# Patient Record
Sex: Female | Born: 1950 | Race: White | Hispanic: No | Marital: Married | Smoking: Never smoker
Health system: Southern US, Community
[De-identification: ages and names within clinical notes are randomized; demographics above are authoritative.]

## PROBLEM LIST (undated history)

## (undated) DIAGNOSIS — K219 Gastro-esophageal reflux disease without esophagitis: Secondary | ICD-10-CM

## (undated) DIAGNOSIS — E119 Type 2 diabetes mellitus without complications: Secondary | ICD-10-CM

## (undated) DIAGNOSIS — C801 Malignant (primary) neoplasm, unspecified: Secondary | ICD-10-CM

## (undated) DIAGNOSIS — J45909 Unspecified asthma, uncomplicated: Secondary | ICD-10-CM

## (undated) DIAGNOSIS — T7840XA Allergy, unspecified, initial encounter: Secondary | ICD-10-CM

## (undated) DIAGNOSIS — M549 Dorsalgia, unspecified: Secondary | ICD-10-CM

## (undated) DIAGNOSIS — F329 Major depressive disorder, single episode, unspecified: Secondary | ICD-10-CM

## (undated) DIAGNOSIS — F32A Depression, unspecified: Secondary | ICD-10-CM

## (undated) DIAGNOSIS — I10 Essential (primary) hypertension: Secondary | ICD-10-CM

## (undated) DIAGNOSIS — E785 Hyperlipidemia, unspecified: Secondary | ICD-10-CM

## (undated) DIAGNOSIS — F419 Anxiety disorder, unspecified: Secondary | ICD-10-CM

## (undated) HISTORY — DX: Allergy, unspecified, initial encounter: T78.40XA

## (undated) HISTORY — DX: Major depressive disorder, single episode, unspecified: F32.9

## (undated) HISTORY — DX: Depression, unspecified: F32.A

## (undated) HISTORY — PX: OTHER SURGICAL HISTORY: SHX169

## (undated) HISTORY — DX: Dorsalgia, unspecified: M54.9

## (undated) HISTORY — DX: Hyperlipidemia, unspecified: E78.5

## (undated) HISTORY — DX: Essential (primary) hypertension: I10

## (undated) HISTORY — DX: Type 2 diabetes mellitus without complications: E11.9

## (undated) HISTORY — PX: TONSILLECTOMY: SUR1361

## (undated) HISTORY — DX: Unspecified asthma, uncomplicated: J45.909

## (undated) HISTORY — DX: Malignant (primary) neoplasm, unspecified: C80.1

---
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
## 1985-09-16 HISTORY — PX: OVARIAN CYST REMOVAL: SHX89

## 1998-05-30 ENCOUNTER — Other Ambulatory Visit: Admission: RE | Admit: 1998-05-30 | Discharge: 1998-05-30 | Payer: Self-pay | Admitting: Obstetrics and Gynecology

## 1999-06-05 ENCOUNTER — Other Ambulatory Visit: Admission: RE | Admit: 1999-06-05 | Discharge: 1999-06-05 | Payer: Self-pay | Admitting: Obstetrics and Gynecology

## 2000-08-12 ENCOUNTER — Other Ambulatory Visit: Admission: RE | Admit: 2000-08-12 | Discharge: 2000-08-12 | Payer: Self-pay | Admitting: Obstetrics and Gynecology

## 2000-11-11 ENCOUNTER — Encounter: Admission: RE | Admit: 2000-11-11 | Discharge: 2000-12-10 | Payer: Self-pay | Admitting: Sports Medicine

## 2001-01-08 ENCOUNTER — Encounter: Admission: RE | Admit: 2001-01-08 | Discharge: 2001-01-15 | Payer: Self-pay | Admitting: Sports Medicine

## 2001-09-23 ENCOUNTER — Other Ambulatory Visit: Admission: RE | Admit: 2001-09-23 | Discharge: 2001-09-23 | Payer: Self-pay | Admitting: Obstetrics and Gynecology

## 2002-11-04 ENCOUNTER — Other Ambulatory Visit: Admission: RE | Admit: 2002-11-04 | Discharge: 2002-11-04 | Payer: Self-pay | Admitting: Obstetrics and Gynecology

## 2003-06-06 ENCOUNTER — Ambulatory Visit (HOSPITAL_COMMUNITY): Admission: RE | Admit: 2003-06-06 | Discharge: 2003-06-06 | Payer: Self-pay | Admitting: Gastroenterology

## 2003-12-01 ENCOUNTER — Other Ambulatory Visit: Admission: RE | Admit: 2003-12-01 | Discharge: 2003-12-01 | Payer: Self-pay | Admitting: Obstetrics and Gynecology

## 2004-10-03 ENCOUNTER — Ambulatory Visit: Payer: Self-pay | Admitting: Family Medicine

## 2004-10-22 ENCOUNTER — Ambulatory Visit: Payer: Self-pay | Admitting: Family Medicine

## 2004-10-25 ENCOUNTER — Ambulatory Visit: Payer: Self-pay | Admitting: Family Medicine

## 2004-10-29 ENCOUNTER — Ambulatory Visit: Payer: Self-pay | Admitting: Family Medicine

## 2004-11-05 ENCOUNTER — Ambulatory Visit: Payer: Self-pay | Admitting: Family Medicine

## 2004-11-06 ENCOUNTER — Encounter: Admission: RE | Admit: 2004-11-06 | Discharge: 2004-11-06 | Payer: Self-pay | Admitting: Gastroenterology

## 2005-01-07 ENCOUNTER — Other Ambulatory Visit: Admission: RE | Admit: 2005-01-07 | Discharge: 2005-01-07 | Payer: Self-pay | Admitting: Obstetrics and Gynecology

## 2005-01-21 ENCOUNTER — Ambulatory Visit: Payer: Self-pay | Admitting: Family Medicine

## 2005-02-07 ENCOUNTER — Ambulatory Visit: Payer: Self-pay | Admitting: Family Medicine

## 2005-10-17 ENCOUNTER — Ambulatory Visit: Payer: Self-pay | Admitting: Family Medicine

## 2005-10-22 ENCOUNTER — Ambulatory Visit: Payer: Self-pay | Admitting: Family Medicine

## 2006-03-14 ENCOUNTER — Ambulatory Visit (HOSPITAL_COMMUNITY): Admission: RE | Admit: 2006-03-14 | Discharge: 2006-03-14 | Payer: Self-pay | Admitting: Obstetrics and Gynecology

## 2006-07-25 ENCOUNTER — Ambulatory Visit: Payer: Self-pay | Admitting: Physician Assistant

## 2006-09-16 ENCOUNTER — Emergency Department (HOSPITAL_COMMUNITY): Admission: EM | Admit: 2006-09-16 | Discharge: 2006-09-16 | Payer: Self-pay | Admitting: Emergency Medicine

## 2006-12-10 ENCOUNTER — Ambulatory Visit: Payer: Self-pay | Admitting: Family Medicine

## 2007-02-15 HISTORY — PX: TRANSTHORACIC ECHOCARDIOGRAM: SHX275

## 2007-02-15 HISTORY — PX: NM MYOCAR PERF WALL MOTION: HXRAD629

## 2008-06-01 ENCOUNTER — Encounter: Admission: RE | Admit: 2008-06-01 | Discharge: 2008-06-01 | Payer: Self-pay | Admitting: Obstetrics and Gynecology

## 2008-09-11 ENCOUNTER — Emergency Department (HOSPITAL_BASED_OUTPATIENT_CLINIC_OR_DEPARTMENT_OTHER): Admission: EM | Admit: 2008-09-11 | Discharge: 2008-09-11 | Payer: Self-pay | Admitting: Emergency Medicine

## 2010-10-08 ENCOUNTER — Encounter: Payer: Self-pay | Admitting: Obstetrics and Gynecology

## 2010-11-17 ENCOUNTER — Emergency Department (HOSPITAL_COMMUNITY)
Admission: EM | Admit: 2010-11-17 | Discharge: 2010-11-17 | Disposition: A | Discharge status: Home / Self Care | Payer: BC Managed Care – PPO | Attending: Emergency Medicine | Admitting: Emergency Medicine

## 2010-11-17 ENCOUNTER — Emergency Department (HOSPITAL_COMMUNITY): Payer: BC Managed Care – PPO

## 2010-11-17 DIAGNOSIS — S63509A Unspecified sprain of unspecified wrist, initial encounter: Secondary | ICD-10-CM | POA: Insufficient documentation

## 2010-11-17 DIAGNOSIS — S93409A Sprain of unspecified ligament of unspecified ankle, initial encounter: Secondary | ICD-10-CM | POA: Insufficient documentation

## 2010-11-17 DIAGNOSIS — W1809XA Striking against other object with subsequent fall, initial encounter: Secondary | ICD-10-CM | POA: Insufficient documentation

## 2010-11-17 DIAGNOSIS — IMO0002 Reserved for concepts with insufficient information to code with codable children: Secondary | ICD-10-CM | POA: Insufficient documentation

## 2010-12-26 ENCOUNTER — Other Ambulatory Visit: Payer: Self-pay | Admitting: Family Medicine

## 2010-12-26 DIAGNOSIS — T1490XA Injury, unspecified, initial encounter: Secondary | ICD-10-CM

## 2010-12-26 DIAGNOSIS — M25562 Pain in left knee: Secondary | ICD-10-CM

## 2010-12-27 ENCOUNTER — Ambulatory Visit (HOSPITAL_COMMUNITY)
Admission: RE | Admit: 2010-12-27 | Discharge: 2010-12-27 | Disposition: A | Discharge status: Home / Self Care | Payer: BC Managed Care – PPO | Source: Ambulatory Visit | Attending: Family Medicine | Admitting: Family Medicine

## 2010-12-27 DIAGNOSIS — M25469 Effusion, unspecified knee: Secondary | ICD-10-CM | POA: Insufficient documentation

## 2010-12-27 DIAGNOSIS — M25569 Pain in unspecified knee: Secondary | ICD-10-CM | POA: Insufficient documentation

## 2010-12-27 DIAGNOSIS — M25562 Pain in left knee: Secondary | ICD-10-CM

## 2010-12-27 DIAGNOSIS — T1490XA Injury, unspecified, initial encounter: Secondary | ICD-10-CM

## 2011-01-29 ENCOUNTER — Ambulatory Visit: Payer: BC Managed Care – PPO | Admitting: Physical Therapy

## 2011-02-01 NOTE — H&P (Signed)
NAME:  [name], [name]                ACCOUNT NO.:  [id number]   MEDICAL RECORD NO.:  [id number]          PATIENT TYPE:  AMB   LOCATION:  SDC                           FACILITY:  [facility]   PHYSICIAN:  Juluis Mire, M.D.   DATE OF BIRTH:  May 21, 1951   DATE OF ADMISSION:  03/14/2006  DATE OF DISCHARGE:                                HISTORY & PHYSICAL   HISTORY OF PRESENT ILLNESS:  The patient is a 60 year old, G3, P3 female who  presents for mid urethral sling.  The patient has had trouble with worsening  stress urinary incontinence.  This is becoming limiting from the patient's  standpoint.  We did urodynamic testing in the office which revealed a normal  voiding pattern.  Cystometric studies were normal.  There was no evidence of  uninhibited bladder contraction.  The patient did leak with stressful events  such as coughing and Valsalva.  She had normal leak point pressures.  Urethral pressure profile was normal and voiding pattern was normal.  There  was no evidence of obstructive pattern.  We had discussed with her options.  Presently, we are admitting her for a mid urethral sling and we are going to  use and obturator approach.   ALLERGIES:  E-MYCIN, TETRACYCLINE AND NAPROXEN.   MEDICATIONS:  1.  Atenolol 50 mg.  2.  Diovan 160 mg.  3.  Crestor 10 mg.  4.  Foradil.  5.  Fexofenadine 180 mg.  6.  Zoloft 100 mg.  7.  Nexium 40 mg daily.  8.  Singulair 10 mg.  9.  Albuterol.  10. Multivitamin.   PAST MEDICAL HISTORY:  1.  History of cardiac arrhythmia.  2.  History of hypertension and elevated cholesterol followed by Dr. Elsie Lincoln.   PAST SURGICAL HISTORY:  1.  Ovarian cyst removed.  2.  Tonsillectomy.   PAST OBSTETRICAL HISTORY:  Three vaginal deliveries.   FAMILY HISTORY:  Noncontributory.   SOCIAL HISTORY:  No tobacco or alcohol use.   REVIEW OF SYSTEMS:  Noncontributory.   PHYSICAL EXAMINATION:  VITAL SIGNS:  Afebrile, stable vital signs.  HEENT:  The patient is  normocephalic, pupils equal round and reactive to  light and accommodation, extraocular movements intact.  Sclerae and  conjunctivae were clear.  Oropharynx clear.  NECK:  Without thyromegaly.  BREASTS:  No discrete masses.  CARDIAC:  Regular rate and rhythm without murmurs, rubs or gallops.  ABDOMEN:  Benign.  No masses, organomegaly or tenderness.  PELVIC:  Normal external genitalia.  Prominent cystourethrocele.  Cervix  unremarkable.  Uterus with fair support.  Bimanual exam with uterus normal  size and shape.  Adnexa unremarkable.  Rectovaginal exam is clear.  EXTREMITIES:  Trace edema.  NEUROLOGIC:  Grossly within normal limits.   IMPRESSION:  1.  Genuine stress urinary incontinence.  2.  Hypertension.  3.  Hypercholesterolemia.   PLAN:  At the present time, the patient will undergo mid urethral sling  using the obturator approach.  Success rates of 80-85% are quoted.  Potential risk of inability to void explained.  This could require long-term  catheterization or self-catheterization.  Eventual taking down of the sling  could be required with return of incontinence.  The risk of obturator nerve  injury have been discussed.  This could lead to weakness or numbness in the  leg or persistent pain.  Risk of mesh erosion explained and this could  require further surgical removal of the mesh.  This could be into the  bladder, urethra or vagina.  Potential risks of surgery include anesthetic  concerns, the risk of infection, the risk of vascular injury that could lead  to hemorrhage requiring transfusion with associated risk of AIDS or  hepatitis, risk of injury to organs including bladder, ureter, urethra or  bowel requiring further exploratory surgery.  There is risk of deep venous  thrombosis and pulmonary embolus.  The patient expressed understanding of  indications and risks.      Juluis Mire, M.D.  Electronically Signed     JSM/MEDQ  D:  03/14/2006  T:  03/14/2006   Job:  [id number]

## 2011-02-01 NOTE — Op Note (Signed)
   NAME:  [name], [name]                      ACCOUNT NO.:  [id number]   MEDICAL RECORD NO.:  [id number]                   PATIENT TYPE:  AMB   LOCATION:  ENDO                                 FACILITY:  [facility]   PHYSICIAN:  John C. Madilyn Fireman, M.D.                 DATE OF BIRTH:  1950/10/25   DATE OF PROCEDURE:  06/06/2003  DATE OF DISCHARGE:                                 OPERATIVE REPORT   PROCEDURE:  Colonoscopy.   INDICATIONS FOR PROCEDURE:  Anemia in a 59 year old patient with no prior  colon screening. A recent hemoglobin of 9.7.   DESCRIPTION OF PROCEDURE:  The patient was placed in the left lateral  decubitus position then placed on the pulse monitor with continuous low flow  oxygen delivered by nasal cannula. She was sedated with 50 mcg IV fentanyl  and 5 mg IV Versed. The Olympus video colonoscope was inserted into the  rectum and advanced to the cecum, confirmed by transillumination at  McBurney's point and visualization at the ileocecal valve and appendiceal  orifice. The prep was excellent. The cecum, ascending, transverse,  descending and sigmoid colon all appeared normal with no masses, polyps,  diverticula or other mucosal abnormalities. The rectum likewise appeared  normal and retroflexed view of the anus revealed no obvious internal  hemorrhoids. The scope was then withdrawn and the patient returned to the  recovery room in stable condition. The patient tolerated the procedure well  and there were no immediate complications.   IMPRESSION:  Normal study.   PLAN:  Will obtain Hemoccults in the near future to make sure that we are  not missing any occult source of blood loss. Otherwise continue workup of  anemia as indicated.                                               John C. Madilyn Fireman, M.D.    JCH/MEDQ  D:  06/06/2003  T:  06/06/2003  Job:  [id number]   cc:   Juluis Mire, M.D.  [address] [place]  [place] [address]  Fax: [phone]

## 2011-02-01 NOTE — Op Note (Signed)
   NAME:  [name], [name]                      ACCOUNT NO.:  [id number]   MEDICAL RECORD NO.:  [id number]                   PATIENT TYPE:  AMB   LOCATION:  ENDO                                 [facility]ACILITY:  [facility]   PHYSICIAN:  John C. Madilyn Fireman, M.D.                 DATE OF BIRTH:  [birth year]   DATE OF PROCEDURE:  06/06/2003  DATE OF DISCHARGE:                                 OPERATIVE REPORT   PROCEDURE:  Esophagogastroduodenoscopy with biopsy.   INDICATIONS FOR PROCEDURE:  Chronic dyspeptic symptoms only partially  controlled by proton pump inhibitor in a patient also undergoing screening  colonoscopy today.   DESCRIPTION OF PROCEDURE:  The patient was placed in the left lateral  decubitus position then placed on the pulse monitor with continuous low flow  oxygen delivered by nasal cannula. She was sedated with 50 mcg IV fentanyl  and 5 mg IV Versed. The Olympus video endoscope was advanced under direct  vision into the oropharynx and esophagus. The esophagus was straight and of  normal caliber with the squamocolumnar line at 38 cm. There was a small 1-2  cm sliding hiatal hernia distal to the squamocolumnar line. The stomach was  entered and a small amount of liquid secretions were suctioned from the  fundus. Retroflexed view of the cardia was unremarkable. The fundus, body,  antrum and pylorus all appeared normal. The duodenum was entered and both  the bulb and second portion were well inspected and appeared to be within  normal limits. The scope was withdrawn back in the stomach and a CLOtest  obtained. The scope was then withdrawn and the patient returned to the  recovery room in stable condition. She tolerated the procedure well and  there were no immediate complications.   IMPRESSION:  Small hiatal hernia otherwise normal study.   PLAN:  Await CLOtest, continue proton pump inhibitor.                                               John C. Madilyn Fireman, M.D.    JCH/MEDQ  D:   06/06/2003  T:  06/06/2003  Job:  [id number]   cc:   Juluis Mire, M.D.  [address] [place]  [place] [address]  Fax: [phone]

## 2011-02-01 NOTE — Op Note (Signed)
[name], [name]                ACCOUNT NO.:  [id number]   MEDICAL RECORD NO.:  [id number]          PATIENT TYPE:  AMB   LOCATION:  SDC                           FACILITY:  [facility]   PHYSICIAN:  Juluis Mire, M.D.   DATE OF BIRTH:  1951-03-08   DATE OF PROCEDURE:  03/14/2006  DATE OF DISCHARGE:                                 OPERATIVE REPORT   PREOPERATIVE DIAGNOSIS:  Stress urinary incontinence.   POSTOPERATIVE DIAGNOSIS:  Stress urinary incontinence.   PROCEDURE:  Mid urethral sling using the transobturator approach.  Cystoscopy.   SURGEON:  Juluis Mire, M.D.   ANESTHESIA:  General.   ESTIMATED BLOOD LOSS:  200 mL.   PACKS/DRAINS:  None.   BLOOD REPLACED:  None.   COMPLICATIONS:  None.   INDICATIONS FOR PROCEDURE:  Dictated in the history and physical.   DESCRIPTION OF PROCEDURE:  The patient was taken to the OR and placed in the  supine position. After a satisfactory level of general anesthesia was  obtained, the patient was placed in the dorsal lithotomy position using the  Allen stirrups. PAS stockings were attached. The patient was then prepped  out with Betadine and draped in a sterile field. A Foley was placed to  straight drain. A suburethral area of the vaginal mucosa was identified and  infiltrated with 1% Xylocaine with epinephrine. Using the knife, an incision  was made in the vaginal mucosa over the urethra and we dissected out  laterally to the obturator foramen. This was using sharp and blunt  dissection, we had good freeing of the urethra from the overlying vaginal  mucosa and a free tunnel out to the obturator foramen on each side. Next, an  area on the vulva at the level of the clitoris was identified. We identified  it below the tendinous insertion of the abductor muscle and this was right  on the edge of the pubic ramus. This was marked with a purple mark,  infiltrated with 1% Xylocaine with epinephrine. A stab incision was made  using the  [facility] system. First we went to the right side. The needle was  located perpendicular to the skin and brought through the obturator fascia  and muscle. It was then rotated and guided out through the vaginal incision.  A similar procedure was followed on the left side. Cystoscopy was performed,  there was no evidence of bladder or urethral injury. Both ureters were  identified and noted to be spilling blue urine. At this point in time, the  polypropylene mesh was hooked to both needles and brought out through the  stab incisions. The plastic was then removed. The polypropylene mesh was  then adjusted under the urethra until it lay flat. However, we could easily  insert a Kelly and rotate it 90 degrees. We felt that it was adequately  tight but not overly tightened. Some areas of bleeding were brought under  control with the Bovie and suture ligatures of 2-0 Vicryl. At this point, we  had good hemostasis, blue tinged clear urine output was noted. At this point  in  time, the vaginal mucosa closed with a running suture of 2-0 Monocryl.  The skin incisions were closed  with Dermabond. We had good hemostasis, adequate urine output. Sponge,  instrument and needle count reported as correct by the circulating nurse x2.  The patient was taken out of the dorsal lithotomy position, once alert and  extubated transferred to the recovery room in good condition.      Juluis Mire, M.D.  Electronically Signed     JSM/MEDQ  D:  03/14/2006  T:  03/14/2006  Job:  [id number]

## 2011-02-05 ENCOUNTER — Ambulatory Visit: Payer: BC Managed Care – PPO | Admitting: Physical Therapy

## 2011-02-07 ENCOUNTER — Ambulatory Visit: Payer: BC Managed Care – PPO | Attending: Family Medicine | Admitting: Physical Therapy

## 2011-02-07 DIAGNOSIS — R5381 Other malaise: Secondary | ICD-10-CM | POA: Insufficient documentation

## 2011-02-07 DIAGNOSIS — M25539 Pain in unspecified wrist: Secondary | ICD-10-CM | POA: Insufficient documentation

## 2011-02-07 DIAGNOSIS — M6281 Muscle weakness (generalized): Secondary | ICD-10-CM | POA: Insufficient documentation

## 2011-02-07 DIAGNOSIS — IMO0001 Reserved for inherently not codable concepts without codable children: Secondary | ICD-10-CM | POA: Insufficient documentation

## 2011-02-13 ENCOUNTER — Ambulatory Visit: Payer: BC Managed Care – PPO | Admitting: Physical Therapy

## 2011-02-20 ENCOUNTER — Ambulatory Visit: Payer: BC Managed Care – PPO | Attending: Family Medicine | Admitting: Physical Therapy

## 2011-02-20 DIAGNOSIS — M6281 Muscle weakness (generalized): Secondary | ICD-10-CM | POA: Insufficient documentation

## 2011-02-20 DIAGNOSIS — M25539 Pain in unspecified wrist: Secondary | ICD-10-CM | POA: Insufficient documentation

## 2011-02-20 DIAGNOSIS — IMO0001 Reserved for inherently not codable concepts without codable children: Secondary | ICD-10-CM | POA: Insufficient documentation

## 2011-02-20 DIAGNOSIS — R5381 Other malaise: Secondary | ICD-10-CM | POA: Insufficient documentation

## 2011-06-21 LAB — URINALYSIS, ROUTINE W REFLEX MICROSCOPIC
Bilirubin Urine: NEGATIVE
Ketones, ur: NEGATIVE mg/dL
Nitrite: POSITIVE — AB
Protein, ur: 30 mg/dL — AB
Urobilinogen, UA: 0.2 mg/dL (ref 0.0–1.0)
pH: 5.5 (ref 5.0–8.0)

## 2011-06-21 LAB — URINE CULTURE: Colony Count: >=100000

## 2011-06-21 LAB — URINE MICROSCOPIC-ADD ON

## 2012-12-17 ENCOUNTER — Ambulatory Visit: Payer: BC Managed Care – PPO | Attending: Family Medicine | Admitting: Physical Therapy

## 2012-12-17 DIAGNOSIS — M545 Low back pain, unspecified: Secondary | ICD-10-CM | POA: Insufficient documentation

## 2012-12-17 DIAGNOSIS — R5381 Other malaise: Secondary | ICD-10-CM | POA: Insufficient documentation

## 2012-12-17 DIAGNOSIS — IMO0001 Reserved for inherently not codable concepts without codable children: Secondary | ICD-10-CM | POA: Insufficient documentation

## 2012-12-21 ENCOUNTER — Ambulatory Visit: Payer: BC Managed Care – PPO | Admitting: Physical Therapy

## 2013-08-18 ENCOUNTER — Ambulatory Visit (INDEPENDENT_AMBULATORY_CARE_PROVIDER_SITE_OTHER): Payer: BC Managed Care – PPO | Admitting: General Practice

## 2013-08-18 ENCOUNTER — Encounter: Payer: Self-pay | Admitting: General Practice

## 2013-08-18 VITALS — BP 149/96 | HR 72 | Temp 99.3°F | Wt 183.5 lb

## 2013-08-18 DIAGNOSIS — J209 Acute bronchitis, unspecified: Secondary | ICD-10-CM

## 2013-08-18 DIAGNOSIS — J329 Chronic sinusitis, unspecified: Secondary | ICD-10-CM

## 2013-08-18 MED ORDER — AMOXICILLIN 500 MG PO CAPS: 500.0000 mg | ORAL_CAPSULE | Freq: Two times a day (BID) | ORAL | Status: DC

## 2013-08-18 MED ORDER — PREDNISONE (PAK) 10 MG PO TABS: ORAL_TABLET | ORAL | Status: DC

## 2013-08-18 MED ORDER — METHYLPREDNISOLONE ACETATE 80 MG/ML IJ SUSP
80.0000 mg | Freq: Once | INTRAMUSCULAR | Status: AC
  Administered 2013-08-18: 80 mg via INTRAMUSCULAR

## 2013-08-18 NOTE — Patient Instructions (Signed)
Bronchitis Bronchitis is the body's way of reacting to injury and/or infection (inflammation) of the bronchi. Bronchi are the air tubes that extend from the windpipe into the lungs. If the inflammation becomes severe, it may cause shortness of breath. CAUSES  Inflammation may be caused by:  A virus.  Germs (bacteria).  Dust.  Allergens.  Pollutants and many other irritants. The cells lining the bronchial tree are covered with tiny hairs (cilia). These constantly beat upward, away from the lungs, toward the mouth. This keeps the lungs free of pollutants. When these cells become too irritated and are unable to do their job, mucus begins to develop. This causes the characteristic cough of bronchitis. The cough clears the lungs when the cilia are unable to do their job. Without either of these protective mechanisms, the mucus would settle in the lungs. Then you would develop pneumonia. Smoking is a common cause of bronchitis and can contribute to pneumonia. Stopping this habit is the single most important thing you can do to help yourself. TREATMENT   Your caregiver may prescribe an antibiotic if the cough is caused by bacteria. Also, medicines that open up your airways make it easier to breathe. Your caregiver may also recommend or prescribe an expectorant. It will loosen the mucus to be coughed up. Only take over-the-counter or prescription medicines for pain, discomfort, or fever as directed by your caregiver.  Removing whatever causes the problem (smoking, for example) is critical to preventing the problem from getting worse.  Cough suppressants may be prescribed for relief of cough symptoms.  Inhaled medicines may be prescribed to help with symptoms now and to help prevent problems from returning.  For those with recurrent (chronic) bronchitis, there may be a need for steroid medicines. SEEK IMMEDIATE MEDICAL CARE IF:   During treatment, you develop more pus-like mucus (purulent  sputum).  You have a fever.  You become progressively more ill.  You have increased difficulty breathing, wheezing, or shortness of breath. It is necessary to seek immediate medical care if you are elderly or sick from any other disease. MAKE SURE YOU:   Understand these instructions.  Will watch your condition.  Will get help right away if you are not doing well or get worse. Document Released: 09/02/2005 Document Revised: 05/05/2013 Document Reviewed: 04/27/2013 [employer] Patient Information 2014 [employer]. Sinusitis Sinusitis is redness, soreness, and swelling (inflammation) of the paranasal sinuses. Paranasal sinuses are air pockets within the bones of your face (beneath the eyes, the middle of the forehead, or above the eyes). In healthy paranasal sinuses, mucus is able to drain out, and air is able to circulate through them by way of your nose. However, when your paranasal sinuses are inflamed, mucus and air can become trapped. This can allow bacteria and other germs to grow and cause infection. Sinusitis can develop quickly and last only a short time (acute) or continue over a long period (chronic). Sinusitis that lasts for more than 12 weeks is considered chronic.  CAUSES  Causes of sinusitis include:  Allergies.  Structural abnormalities, such as displacement of the cartilage that separates your nostrils (deviated septum), which can decrease the air flow through your nose and sinuses and affect sinus drainage.  Functional abnormalities, such as when the small hairs (cilia) that line your sinuses and help remove mucus do not work properly or are not present. SYMPTOMS  Symptoms of acute and chronic sinusitis are the same. The primary symptoms are pain and pressure around the affected sinuses. Other   symptoms include:  Upper toothache.  Earache.  Headache.  Bad breath.  Decreased sense of smell and taste.  A cough, which worsens when you are lying  flat.  Fatigue.  Fever.  Thick drainage from your nose, which often is green and may contain pus (purulent).  Swelling and warmth over the affected sinuses. DIAGNOSIS  Your caregiver will perform a physical exam. During the exam, your caregiver may:  Look in your nose for signs of abnormal growths in your nostrils (nasal polyps).  Tap over the affected sinus to check for signs of infection.  View the inside of your sinuses (endoscopy) with a special imaging device with a light attached (endoscope), which is inserted into your sinuses. If your caregiver suspects that you have chronic sinusitis, one or more of the following tests may be recommended:  Allergy tests.  Nasal culture A sample of mucus is taken from your nose and sent to a lab and screened for bacteria.  Nasal cytology A sample of mucus is taken from your nose and examined by your caregiver to determine if your sinusitis is related to an allergy. TREATMENT  Most cases of acute sinusitis are related to a viral infection and will resolve on their own within 10 days. Sometimes medicines are prescribed to help relieve symptoms (pain medicine, decongestants, nasal steroid sprays, or saline sprays).  However, for sinusitis related to a bacterial infection, your caregiver will prescribe antibiotic medicines. These are medicines that will help kill the bacteria causing the infection.  Rarely, sinusitis is caused by a fungal infection. In theses cases, your caregiver will prescribe antifungal medicine. For some cases of chronic sinusitis, surgery is needed. Generally, these are cases in which sinusitis recurs more than 3 times per year, despite other treatments. HOME CARE INSTRUCTIONS   Drink plenty of water. Water helps thin the mucus so your sinuses can drain more easily.  Use a humidifier.  Inhale steam 3 to 4 times a day (for example, sit in the bathroom with the shower running).  Apply a warm, moist washcloth to your face 3  to 4 times a day, or as directed by your caregiver.  Use saline nasal sprays to help moisten and clean your sinuses.  Take over-the-counter or prescription medicines for pain, discomfort, or fever only as directed by your caregiver. SEEK IMMEDIATE MEDICAL CARE IF:  You have increasing pain or severe headaches.  You have nausea, vomiting, or drowsiness.  You have swelling around your face.  You have vision problems.  You have a stiff neck.  You have difficulty breathing. MAKE SURE YOU:   Understand these instructions.  Will watch your condition.  Will get help right away if you are not doing well or get worse. Document Released: 09/02/2005 Document Revised: 11/25/2011 Document Reviewed: 09/17/2011 [employer] Patient Information 2014 [employer].  

## 2013-08-18 NOTE — Progress Notes (Signed)
   Subjective:    Patient ID: [name], female    DOB: [birth year], 62 y.o.   MRN: [id number]  Cough This is a new problem. The current episode started in the past 7 days. The problem has been gradually worsening. The problem occurs every few minutes. The cough is productive of sputum. Associated symptoms include ear congestion, nasal congestion and postnasal drip. Pertinent negatives include no chest pain, chills, fever, headaches, sore throat, shortness of breath or wheezing. The symptoms are aggravated by lying down. Her past medical history is significant for asthma and bronchitis. There is no history of pneumonia.      Review of Systems  Constitutional: Negative for fever and chills.  HENT: Positive for postnasal drip and sinus pressure. Negative for sore throat.   Respiratory: Positive for cough. Negative for chest tightness, shortness of breath and wheezing.   Cardiovascular: Negative for chest pain and palpitations.  Gastrointestinal: Negative for nausea and vomiting.  Genitourinary: Negative for difficulty urinating.  Neurological: Negative for dizziness, weakness and headaches.       Objective:   Physical Exam  Constitutional: She is oriented to person, place, and time. She appears well-developed and well-nourished.  HENT:  Head: Normocephalic and atraumatic.  Right Ear: External ear normal.  Left Ear: External ear normal.  Nose: Right sinus exhibits maxillary sinus tenderness and frontal sinus tenderness. Left sinus exhibits maxillary sinus tenderness and frontal sinus tenderness.  Mouth/Throat: Posterior oropharyngeal erythema present.  Cardiovascular: Normal rate, regular rhythm and normal heart sounds.   Pulmonary/Chest: Effort normal and breath sounds normal. No respiratory distress. She exhibits no tenderness.  Neurological: She is alert and oriented to person, place, and time.  Skin: Skin is warm and dry.  Psychiatric: She has a normal mood and affect.           Assessment & Plan:  1. Acute bronchitis  - methylPREDNISolone acetate (DEPO-MEDROL) injection 80 mg; Inject 1 mL (80 mg total) into the muscle once. - predniSONE (STERAPRED UNI-PAK) 10 MG tablet; Take as directed, start tomorrow 08/19/13  Dispense: 21 tablet; Refill: 0  2. Sinusitis  - amoxicillin (AMOXIL) 500 MG capsule; Take 1 capsule (500 mg total) by mouth 2 (two) times daily.  Dispense: 20 capsule; Refill: 0 -Patient to follow up with primary care provider about blood pressure -RTO if symptoms worsen -Patient verbalized understanding Coralie Keens, FNP-C

## 2013-10-18 ENCOUNTER — Telehealth: Payer: Self-pay | Admitting: *Deleted

## 2013-10-18 NOTE — Telephone Encounter (Signed)
Pt was last seen in 2011 and is coming in on 2/18 with Dr. Debara Pickett. She was last seen in 2011 and wanted to have a lab slip mailed to her so that she can have it done prior to coming in.  Yaak

## 2013-10-18 NOTE — Telephone Encounter (Signed)
Called patient back. Instructed patient that Dr. Debara Pickett will order any blood work he sees necessary at [place] on 2/18

## 2013-11-02 ENCOUNTER — Encounter: Payer: Self-pay | Admitting: *Deleted

## 2013-11-03 ENCOUNTER — Ambulatory Visit: Payer: BC Managed Care – PPO | Admitting: Internal Medicine

## 2014-12-06 ENCOUNTER — Emergency Department (HOSPITAL_COMMUNITY)
Admission: EM | Admit: 2014-12-06 | Discharge: 2014-12-07 | Disposition: A | Discharge status: Home / Self Care | Payer: BLUE CROSS/BLUE SHIELD | Attending: Emergency Medicine | Admitting: Emergency Medicine

## 2014-12-06 ENCOUNTER — Encounter (HOSPITAL_COMMUNITY): Payer: Self-pay

## 2014-12-06 ENCOUNTER — Emergency Department (HOSPITAL_COMMUNITY): Payer: BLUE CROSS/BLUE SHIELD

## 2014-12-06 DIAGNOSIS — J45909 Unspecified asthma, uncomplicated: Secondary | ICD-10-CM | POA: Insufficient documentation

## 2014-12-06 DIAGNOSIS — Z85828 Personal history of other malignant neoplasm of skin: Secondary | ICD-10-CM | POA: Diagnosis not present

## 2014-12-06 DIAGNOSIS — Z79899 Other long term (current) drug therapy: Secondary | ICD-10-CM | POA: Insufficient documentation

## 2014-12-06 DIAGNOSIS — F329 Major depressive disorder, single episode, unspecified: Secondary | ICD-10-CM | POA: Insufficient documentation

## 2014-12-06 DIAGNOSIS — Z8639 Personal history of other endocrine, nutritional and metabolic disease: Secondary | ICD-10-CM | POA: Diagnosis not present

## 2014-12-06 DIAGNOSIS — R4182 Altered mental status, unspecified: Secondary | ICD-10-CM | POA: Diagnosis present

## 2014-12-06 DIAGNOSIS — G454 Transient global amnesia: Secondary | ICD-10-CM | POA: Diagnosis not present

## 2014-12-06 DIAGNOSIS — Z792 Long term (current) use of antibiotics: Secondary | ICD-10-CM | POA: Diagnosis not present

## 2014-12-06 DIAGNOSIS — I10 Essential (primary) hypertension: Secondary | ICD-10-CM | POA: Insufficient documentation

## 2014-12-06 DIAGNOSIS — Z91040 Latex allergy status: Secondary | ICD-10-CM | POA: Diagnosis not present

## 2014-12-06 LAB — URINE MICROSCOPIC-ADD ON

## 2014-12-06 LAB — URINALYSIS, ROUTINE W REFLEX MICROSCOPIC
BILIRUBIN URINE: NEGATIVE
Glucose, UA: NEGATIVE mg/dL
Hgb urine dipstick: NEGATIVE
Ketones, ur: NEGATIVE mg/dL
Nitrite: NEGATIVE
Protein, ur: NEGATIVE mg/dL
Specific Gravity, Urine: 1.007 (ref 1.005–1.030)
UROBILINOGEN UA: 0.2 mg/dL (ref 0.0–1.0)
pH: 6 (ref 5.0–8.0)

## 2014-12-06 LAB — COMPREHENSIVE METABOLIC PANEL
ALBUMIN: 4.3 g/dL (ref 3.5–5.2)
ALT: 59 U/L — AB (ref 0–35)
AST: 54 U/L — AB (ref 0–37)
Alkaline Phosphatase: 72 U/L (ref 39–117)
Anion gap: 7 (ref 5–15)
BUN: 9 mg/dL (ref 6–23)
CALCIUM: 9.2 mg/dL (ref 8.4–10.5)
CO2: 26 mmol/L (ref 19–32)
Chloride: 108 mmol/L (ref 96–112)
Creatinine, Ser: 0.98 mg/dL (ref 0.50–1.10)
GFR calc Af Amer: 70 mL/min — ABNORMAL LOW (ref >90)
GFR, EST NON AFRICAN AMERICAN: 60 mL/min — AB (ref >90)
Glucose, Bld: 88 mg/dL (ref 70–99)
Potassium: 3.5 mmol/L (ref 3.5–5.1)
SODIUM: 141 mmol/L (ref 135–145)
Total Bilirubin: 0.8 mg/dL (ref 0.3–1.2)
Total Protein: 6.8 g/dL (ref 6.0–8.3)

## 2014-12-06 LAB — DIFFERENTIAL
BASOS PCT: 1 % (ref 0–1)
Basophils Absolute: 0 10*3/uL (ref 0.0–0.1)
EOS PCT: 4 % (ref 0–5)
Eosinophils Absolute: 0.3 10*3/uL (ref 0.0–0.7)
LYMPHS PCT: 35 % (ref 12–46)
Lymphs Abs: 2.7 10*3/uL (ref 0.7–4.0)
MONO ABS: 0.5 10*3/uL (ref 0.1–1.0)
Monocytes Relative: 6 % (ref 3–12)
Neutro Abs: 4.1 10*3/uL (ref 1.7–7.7)
Neutrophils Relative %: 54 % (ref 43–77)

## 2014-12-06 LAB — CBC
HCT: 41.6 % (ref 36.0–46.0)
Hemoglobin: 14.6 g/dL (ref 12.0–15.0)
MCH: 29.5 pg (ref 26.0–34.0)
MCHC: 35.1 g/dL (ref 30.0–36.0)
MCV: 84 fL (ref 78.0–100.0)
Platelets: 161 10*3/uL (ref 150–400)
RBC: 4.95 MIL/uL (ref 3.87–5.11)
RDW: 13.3 % (ref 11.5–15.5)
WBC: 7.6 10*3/uL (ref 4.0–10.5)

## 2014-12-06 LAB — I-STAT CHEM 8, ED
BUN: 11 mg/dL (ref 6–23)
CREATININE: 0.9 mg/dL (ref 0.50–1.10)
Calcium, Ion: 1.15 mmol/L (ref 1.13–1.30)
Chloride: 106 mmol/L (ref 96–112)
GLUCOSE: 87 mg/dL (ref 70–99)
HCT: 42 % (ref 36.0–46.0)
HEMOGLOBIN: 14.3 g/dL (ref 12.0–15.0)
POTASSIUM: 3.5 mmol/L (ref 3.5–5.1)
Sodium: 141 mmol/L (ref 135–145)
TCO2: 22 mmol/L (ref 0–100)

## 2014-12-06 LAB — I-STAT TROPONIN, ED: Troponin i, poc: 0 ng/mL (ref 0.00–0.08)

## 2014-12-06 LAB — CBG MONITORING, ED: Glucose-Capillary: 93 mg/dL (ref 70–99)

## 2014-12-06 LAB — PROTIME-INR
INR: 1.07 (ref 0.00–1.49)
Prothrombin Time: 14 seconds (ref 11.6–15.2)

## 2014-12-06 LAB — APTT: APTT: 33 s (ref 24–37)

## 2014-12-06 NOTE — ED Notes (Signed)
Patient transported to MRI 

## 2014-12-06 NOTE — ED Notes (Signed)
Pt ambulated to restroom unassisted with steady gait

## 2014-12-06 NOTE — ED Notes (Signed)
Patient transported to CT 

## 2014-12-06 NOTE — Discharge Instructions (Signed)
Transient Global Amnesia Your exam shows you may have a rare problem that causes temporary amnesia, an inability to remember what has happened in the past several hours or day. Transient global amnesia (TGA) means you cannot remember recent events, even though you may look and act normally. There are no physical problems in TGA; your vision, strength, coordination, and sensations are all normal. TGA occurs most often in older patients, and in patients with high blood pressure. The exact cause of TGA is not known, although it is thought to be due to vascular disease in your brain. There is usually a complete return to normal memory capacity after an episode is over. About 20-30% of patients with TGA will have more than one episode, and some studies show a slight increased risk for stroke. Although no special treatment is needed, taking up to one adult aspirin daily reduces the risk of having a stroke. You should consider taking aspirin daily if you are not allergic to it. Medical evaluation may require specialized scans to check for stroke or other brain problems, an EEG (brain wave test), or blood tests. Avoid alcohol or any sedating medicines until you are completely recovered. Call your doctor right away if your memory is not fully recovered after 24 hours, or if you have any other serious problems including:  Severe headache, nausea, vomiting, fever, or other symptoms of an infection.  Weakness, numbness, difficulty with movement, or incoordination.  Blurred or double vision, unusual sleepiness, seizures, or fainting. Document Released: 10/10/2004 Document Revised: 11/25/2011 Document Reviewed: 09/02/2005 [facility] Patient Information 2015 [place], [place]. This information is not intended to replace advice given to you by your health care provider. Make sure you discuss any questions you have with your health care provider.

## 2014-12-06 NOTE — ED Notes (Signed)
Pt was at work and noticed at 1600 that she felt like she was forgetting how to do things. Couldn't remember her password. States the left side of her head feels funny. Denies any speech deficits, gait problems. Husband asked her normal questions they ask daily and she questioned him why he was asking her those things.

## 2014-12-06 NOTE — ED Provider Notes (Signed)
CSN: [id number]     Arrival date & time 12/06/14  1817 History   First MD Initiated Contact with Patient 12/06/14 1838     Chief Complaint  Patient presents with  . Altered Mental Status     (Consider location/radiation/quality/duration/timing/severity/associated sxs/prior Treatment) Patient is a 64 y.o. female presenting with neurologic complaint. The history is provided by the patient.  Neurologic Problem This is a new problem. The current episode started today. The problem occurs constantly. The problem has been unchanged. Pertinent negatives include no chest pain, coughing, fever, headaches, numbness, visual change or weakness. Associated symptoms comments: Forgetfulness doing basic tasks. Nothing aggravates the symptoms. She has tried nothing for the symptoms. The treatment provided no relief.    Past Medical History  Diagnosis Date  . Asthma   . Allergy   . Hypertension   . Cancer     skin cancer  . Depression   . Back pain   . Hyperlipidemia    Past Surgical History  Procedure Laterality Date  . Tonsilectomy/adenoidectomy with myringotomy  1958  . Ovarian cyst removal  1987  . Bladder mesh    . Vaginal delivery      x3  . Transthoracic echocardiogram  02/2007    RV mildly dlated; LV normal in size; mild MR with thickened MV leaflets; mild TR; mild pulm valve regurg  . Nm myocar perf wall motion  02/2007    dipyridamole myoview; EF 70%, no wall motion abnormalities, no inducible ischemia, low risk    Family History  Problem Relation Age of Onset  . Heart disease Mother   . Kidney disease Mother   . Breast cancer Mother   . Coronary artery disease Mother     CABG at age 68  . Dementia Father   . Stroke Father   . Stroke Maternal Grandmother   . Cancer Maternal Grandfather   . Cancer Paternal Grandmother   . Kidney disease Paternal Grandfather    History  Substance Use Topics  . Smoking status: Never Smoker   . Smokeless tobacco: Not on file  . Alcohol Use:  No   OB History    No data available     Review of Systems  Constitutional: Negative for fever.  Respiratory: Negative for cough.   Cardiovascular: Negative for chest pain.  Neurological: Negative for weakness, numbness and headaches.  All other systems reviewed and are negative.     Allergies  Bee venom; Crestor; Aleve; Erythromycin; Latex; Naproxen; Tetracyclines & related; and Vytorin  Home Medications   Prior to Admission medications   Medication Sig Start Date End Date Taking? Authorizing Provider  atenolol (TENORMIN) 50 MG tablet Take 50 mg by mouth daily.   Yes Historical Provider, MD  budesonide (PULMICORT) 180 MCG/ACT inhaler Inhale 2 puffs into the lungs 2 (two) times daily as needed (for shortness of breath).    Yes Historical Provider, MD  esomeprazole (NEXIUM) 40 MG capsule Take 20 mg by mouth daily at 12 noon.    Yes Historical Provider, MD  montelukast (SINGULAIR) 10 MG tablet Take 10 mg by mouth at bedtime.   Yes Historical Provider, MD  Multiple Vitamin (MULTIVITAMIN) capsule Take 1 capsule by mouth 2 (two) times a week.    Yes Historical Provider, MD  sertraline (ZOLOFT) 100 MG tablet Take 50-100 mg by mouth every other day.  09/13/14 12/12/14 Yes Historical Provider, MD  amoxicillin (AMOXIL) 500 MG capsule Take 1 capsule (500 mg total) by mouth 2 (two) times daily.  08/18/13   Erby Pian, FNP  Biotin 1000 MCG tablet Take 1,000 mcg by mouth daily.    Historical Provider, MD  predniSONE (STERAPRED UNI-PAK) 10 MG tablet Take as directed, start tomorrow 08/19/13 08/18/13   Erby Pian, FNP   BP 175/95 mmHg  Pulse 62  Temp(Src) 98 F (36.7 C) (Oral)  Resp 15  SpO2 94% Physical Exam  Constitutional: She is oriented to person, place, and time. She appears well-developed and well-nourished. No distress.  HENT:  Head: Normocephalic and atraumatic.  Mouth/Throat: Oropharynx is clear and moist.  Eyes: EOM are normal. Pupils are equal, round, and reactive to  light.  Neck: Normal range of motion. Neck supple.  Cardiovascular: Normal rate, regular rhythm, normal heart sounds and intact distal pulses.   Pulmonary/Chest: Effort normal and breath sounds normal. No respiratory distress. She has no wheezes. She has no rales.  Abdominal: Soft. She exhibits no distension. There is no tenderness.  Musculoskeletal: Normal range of motion. She exhibits no edema or tenderness.  Lymphadenopathy:    She has no cervical adenopathy.  Neurological: She is alert and oriented to person, place, and time. She has normal strength. No cranial nerve deficit or sensory deficit. She displays a negative Romberg sign. Coordination and gait normal.  Normal recall and memory of today's events. Some mild repetition. Trouble remembering her phone number, dog's name.  Skin: Skin is warm and dry. No rash noted. She is not diaphoretic.  Psychiatric: She has a normal mood and affect. Her behavior is normal. Judgment and thought content normal.  Nursing note and vitals reviewed.   ED Course  Procedures (including critical care time) Labs Review Labs Reviewed  COMPREHENSIVE METABOLIC PANEL - Abnormal; Notable for the following:    AST 54 (*)    ALT 59 (*)    GFR calc non Af Amer 60 (*)    GFR calc Af Amer 70 (*)    All other components within normal limits  URINALYSIS, ROUTINE W REFLEX MICROSCOPIC - Abnormal; Notable for the following:    Leukocytes, UA TRACE (*)    All other components within normal limits  URINE CULTURE  PROTIME-INR  APTT  CBC  DIFFERENTIAL  URINE MICROSCOPIC-ADD ON  CBG MONITORING, ED  I-STAT CHEM 8, ED  I-STAT TROPOININ, ED    Imaging Review Ct Head (brain) Wo Contrast  12/06/2014   CLINICAL DATA:  Altered mental status.  EXAM: CT HEAD WITHOUT CONTRAST  TECHNIQUE: Contiguous axial images were obtained from the base of the skull through the vertex without intravenous contrast.  COMPARISON:  None.  FINDINGS: Skull and Sinuses:No fracture destructive  process. Retained secretions or mucous retention cyst in the left maxillary sinus, incidental based on the history.  Orbits: No acute abnormality.  Brain: No evidence of acute infarction, hemorrhage, hydrocephalus, or mass lesion/mass effect. No evidence of temporal lobe edema.  IMPRESSION: Negative head CT.   Electronically Signed   By: Monte Fantasia M.D.   On: 12/06/2014 19:45   Mr Jeri Cos ZJ Contrast  12/06/2014   CLINICAL DATA:  Initial evaluation for acute altered mental status, and difficulty with memory.  EXAM: MRI HEAD WITHOUT AND WITH CONTRAST  TECHNIQUE: Multiplanar, multiecho pulse sequences of the brain and surrounding structures were obtained without and with intravenous contrast.  CONTRAST:  18 cc of MultiHance.  COMPARISON:  Prior CT from 12/06/2014  FINDINGS: The CSF containing spaces are within normal limits for patient age. No focal parenchymal signal abnormality is identified.  No mass lesion, midline shift, or extra-axial fluid collection. Ventricles are normal in size without evidence of hydrocephalus.  No diffusion-weighted signal abnormality is identified to suggest acute intracranial infarct. Gray-white matter differentiation is maintained. Normal flow voids are seen within the intracranial vasculature. No intracranial hemorrhage identified.  The cervicomedullary junction is normal. Pituitary gland is within normal limits. Pituitary stalk is midline. The globes and optic nerves demonstrate a normal appearance with normal signal intensity.  Definite abnormal enhancement, although post-contrast sequences are somewhat degraded by motion artifact.  The bone marrow signal intensity is normal. Calvarium is intact. Visualized upper cervical spine is within normal limits.  Scalp soft tissues are unremarkable.  Mild layering opacity present within the left maxillary sinus. Paranasal sinuses are otherwise clear. No mastoid effusion.  IMPRESSION: Negative contrast-enhanced brain MRI with no acute  intracranial abnormality identified.   Electronically Signed   By: Jeannine Boga M.D.   On: 12/06/2014 23:28     EKG Interpretation None      MDM   Final diagnoses:  Transient global amnesia   64 year old female with past medical history of hypertension who presents with episodes of forgetfulness. Symptoms started personally 4:00 this afternoon with difficulty regarding how to do simple tasks at work. She had no difficulty with speech and no weakness or numbness. She was able to speak without difficulty, she just had trouble finishing her work and even while getting out of the computer. She recalls all of these movements without difficulty and has no memory deficits on my exam. Nonfocal neurologic exam without weakness, dysarthria, dysphasia. Presentation is not consistent with stroke. Possible acute encephalopathy although she has no fever and no other infectious symptoms. Hypertensive which may be her baseline. Laboratory workup unremarkable. CT head and urinalysis pending.  9:30 PM CT head shows no acute intracranial abnormality. Discussed with neurology who agrees with MRI to evaluate for possible small occult parietal stroke. Other differential includes hypertensive encephalopathy or transient global amnesia. MRI ordered.  MRI is negative. Patient's memory is now back to baseline. Discussed with her possible etiologies including transient global anemia or hypertensive encephalopathy. Discussed neurology follow-up as well as PCP follow-up for better blood pressure regulation.  Larence Penning, MD 12/07/14 0881  Elnora Morrison, MD 12/07/14 [phone]

## 2014-12-06 NOTE — ED Notes (Signed)
MD at bedside. 

## 2014-12-08 LAB — URINE CULTURE: Colony Count: 30000

## 2014-12-09 ENCOUNTER — Ambulatory Visit (INDEPENDENT_AMBULATORY_CARE_PROVIDER_SITE_OTHER): Payer: BLUE CROSS/BLUE SHIELD | Admitting: Neurology

## 2014-12-09 ENCOUNTER — Encounter: Payer: Self-pay | Admitting: Neurology

## 2014-12-09 VITALS — BP 135/91 | HR 61 | Ht 67.25 in | Wt 187.8 lb

## 2014-12-09 DIAGNOSIS — E782 Mixed hyperlipidemia: Secondary | ICD-10-CM | POA: Insufficient documentation

## 2014-12-09 DIAGNOSIS — R413 Other amnesia: Secondary | ICD-10-CM | POA: Insufficient documentation

## 2014-12-09 DIAGNOSIS — E785 Hyperlipidemia, unspecified: Secondary | ICD-10-CM | POA: Diagnosis not present

## 2014-12-09 DIAGNOSIS — E1159 Type 2 diabetes mellitus with other circulatory complications: Secondary | ICD-10-CM | POA: Insufficient documentation

## 2014-12-09 DIAGNOSIS — I10 Essential (primary) hypertension: Secondary | ICD-10-CM | POA: Insufficient documentation

## 2014-12-09 DIAGNOSIS — E1169 Type 2 diabetes mellitus with other specified complication: Secondary | ICD-10-CM | POA: Insufficient documentation

## 2014-12-09 DIAGNOSIS — I152 Hypertension secondary to endocrine disorders: Secondary | ICD-10-CM | POA: Insufficient documentation

## 2014-12-09 HISTORY — DX: Other amnesia: R41.3

## 2014-12-09 MED ORDER — ASPIRIN EC 81 MG PO TBEC: 81.0000 mg | DELAYED_RELEASE_TABLET | Freq: Every day | ORAL | Status: DC

## 2014-12-09 NOTE — Progress Notes (Signed)
NEUROLOGY CLINIC NEW PATIENT NOTE  NAME: [name] DOB: Feb 07, 1951  I saw [name] as a new patient in the neurovascular clinic today regarding  Chief Complaint  Patient presents with  . New Evaluation    RM 1  . Altered mental status    MMSE 29/30 AFT 26    HPI: [name] is a 64 y.o. female with PMH of HTN, HLD who presents as a new patient for Transient memory difficulty.   Patient stated thaton 12/06/2014, she was at work in office, around 4:30pm, she was not able to remember how to do her regular work, for example she forgot how to fill out a simple deposit take it, forgot her password, although her coworker tried to teacher or extending to her how to do the regular things she was just not able to register. However, she was able to have normal conversation, normal communication, no slurred speech, weakness, numbness, vision changes. She stated that this gradually getting worse, and she was not able to remember her phone number, her pet name, not able to remember her medications. She was sent to the [facility] ER, had evaluation with CT and MRI brain which were both unremarkable. She stated that her symptoms resolved about 6-8 hours later,and she was sent home with outpatient follow-up.   She is able to remember the whole event from beginning, did not asking the same questions over and over. She stated that she was upset on Saturday about the auction of her parents house, this feeling lingers over to Monday, possibly Tuesday too. She cannot remember if there's anything specific happened on Tuesday during work.  She denies history of a migraine headache. However she stated thatshe had an some pressure in her head especially on the left side of her head when the event happened.over the last 2 days, she still feel some pressure and some woozy feeling in her head.  She had a history of hypertension, however  She seems not taking any blood pressure medication at this moment, and her  blood pressure today 135/91. She had a history of hyperlipidemia, not tolerating 3 different statin medications, which causing her muscle aches. She denies diabetes, tobacco use, heavy drinking or illicit drugs.  Family  History including mom had hypertension and died of heart disease, dad had bleeding in the brain and hypertension.  Past Medical History  Diagnosis Date  . Asthma   . Allergy   . Hypertension   . Cancer     skin cancer  . Depression   . Back pain   . Hyperlipidemia    Past Surgical History  Procedure Laterality Date  . Tonsilectomy/adenoidectomy with myringotomy  1958  . Ovarian cyst removal  1987  . Bladder mesh    . Vaginal delivery      x3  . Transthoracic echocardiogram  02/2007    RV mildly dlated; LV normal in size; mild MR with thickened MV leaflets; mild TR; mild pulm valve regurg  . Nm myocar perf wall motion  02/2007    dipyridamole myoview; EF 70%, no wall motion abnormalities, no inducible ischemia, low risk    Family History  Problem Relation Age of Onset  . Heart disease Mother   . Kidney disease Mother   . Breast cancer Mother   . Coronary artery disease Mother     CABG at age 36  . Dementia Father   . Stroke Father   . Stroke Maternal Grandmother   .  Cancer Maternal Grandfather   . Cancer Paternal Grandmother   . Kidney disease Paternal Grandfather    Current Outpatient Prescriptions  Medication Sig Dispense Refill  . atenolol (TENORMIN) 50 MG tablet Take 50 mg by mouth daily.    . Biotin 1000 MCG tablet Take 1,000 mcg by mouth daily.    . budesonide (PULMICORT) 180 MCG/ACT inhaler Inhale 2 puffs into the lungs 2 (two) times daily as needed (for shortness of breath).     . esomeprazole (NEXIUM) 20 MG capsule Take 20 mg by mouth daily at 12 noon.    . montelukast (SINGULAIR) 10 MG tablet Take 10 mg by mouth at bedtime.    . Multiple Vitamin (MULTIVITAMIN) capsule Take 1 capsule by mouth 2 (two) times a week.     Marland Kitchen PROAIR HFA 108 (90 BASE)  MCG/ACT inhaler Inhale 2 puffs into the lungs as needed.  2  . sertraline (ZOLOFT) 100 MG tablet Take 50-100 mg by mouth every other day.     Marland Kitchen aspirin EC 81 MG tablet Take 1 tablet (81 mg total) by mouth daily.     No current facility-administered medications for this visit.   Allergies  Allergen Reactions  . Bee Venom Swelling  . Crestor [Rosuvastatin] Other (See Comments)    Myalgias  . Aleve [Naproxen Sodium] Other (See Comments)    unknown  . Erythromycin Itching and Rash  . Latex Itching and Rash  . Naproxen Other (See Comments)    unknown  . Tetracyclines & Related Other (See Comments)    unknown  . Vytorin [Ezetimibe-Simvastatin] Other (See Comments)    unknown   History   Social History  . Marital Status: Married    Spouse Name: N/A  . Number of Children: N/A  . Years of Education: N/A   Occupational History  . Not on file.   Social History Main Topics  . Smoking status: Never Smoker   . Smokeless tobacco: Not on file  . Alcohol Use: No  . Drug Use: No  . Sexual Activity: Not on file   Other Topics Concern  . Not on file   Social History Narrative    Review of Systems Full 14 system review of systems performed and notable only for those listed, all others are neg:  Constitutional:   Cardiovascular:  Ear/Nose/Throat:   Skin:  Eyes:   Respiratory:  Shortness of breath, wheezing Gastroitestinal:   Genitourinary:  Hematology/Lymphatic:   Endocrine:  Musculoskeletal:   Allergy/Immunology:  allergy Neurological:  Memory loss, confusion, headache Psychiatric: anxiety Sleep: snoring, restless leg   Physical Exam  Filed Vitals:   12/09/14 0845  BP: 135/91  Pulse: 44    General - Well nourished, well developed, in no apparent distress.  Ophthalmologic - Sharp disc margins OU.  Cardiovascular - Regular rate and rhythm with no murmur. Carotid pulses were 2+ without bruits .   Neck - supple, no nuchal rigidity.  Mental Status -  Level of  arousal and orientation to time, place, and person were intact. Language including expression, naming, repetition, comprehension, reading, and writing was assessed and found intact. Attention span and concentration were normal. Recent and remote memory were 3/3 registration and 2/3 delayed recall. Fund of Knowledge was assessed and was intact.  mini-mental status exam  Orientation to time - 5/5 Orientation to place - 5/5 Registration - 3/3 Attention - 5/5 Delayed recall - 2/3 Naming - 2/2 Repetition - 1/1 Comprehension - 3/3 Reading - 1/1 Writing - 1/1  Visuospatial - 1/1  Total 29/30  Cranial Nerves II - XII - II - Visual field intact OU. III, IV, VI - Extraocular movements intact. V - Facial sensation intact bilaterally. VII - Facial movement intact bilaterally. VIII - Hearing & vestibular intact bilaterally. X - Palate elevates symmetrically. XI - Chin turning & shoulder shrug intact bilaterally. XII - Tongue protrusion intact.  Motor Strength - The patient's strength was normal in all extremities and pronator drift was absent.  Bulk was normal and fasciculations were absent.   Motor Tone - Muscle tone was assessed at the neck and appendages and was normal.  Reflexes - The patient's reflexes were normal in all extremities and she had no pathological reflexes.  Sensory - Light touch, temperature/pinprick, vibration and proprioception, and Romberg testing were assessed and were normal.    Coordination - The patient had normal movements in the hands and feet with no ataxia or dysmetria.  Tremor was absent.  Gait and Station - The patient's transfers, posture, gait, station, and turns were observed as normal.   Imaging  I have personally reviewed the radiological images below and agree with the radiology interpretations.  MRI - Negative contrast-enhanced brain MRI with no acute intracranial abnormality identified.  CT - negative head CT  Lab Review none     Assessment and Plan:   In summary, [name] is a 64 y.o. female with PMH of HTN, HLD presents as new patient for ER follow-up of transient memory difficulties. She had transient memory retrieval of specific items on 12/06/2014, lasting 6-8 hours. However, she was able to remember whole events, did not asking questions over and over, has only difficulty with specific item retrieval, which is not a typical for transient global amnesia. She was under some emotional stress over last weekend. The event may be variant of TGA or stress/emotion related. Low suspicious for stroke or seizure were complicated migraine. However, patient does have some stroke risk factors including history of hypertension and hyperlipidemia, would like to further workup to rule out seizure or stroke. Recommend baby aspirin daily.  - EEG - TCD and carotid Doppler - 2D echo - A1C, lipid panel and TSH - Recommend aspirin 81 daily for stroke prevention - Follow up with your primary care physician for stroke risk factor modification. Recommend maintain blood pressure goal <130/80, diabetes with hemoglobin A1c goal below 6.5% and lipids with LDL cholesterol goal below 70 mg/dL.  - RTC in 2 months.  I recommend aggressive blood pressure control with a goal <130/80 mm Hg.  Lipids should be managed intensively, with a goal LDL < 70 mg/dL.  I encouraged the patient to discuss these important issues with her primary care physician.  I counseled the patient on measures to reduce stroke risk, including the importance of medication compliance, risk factor control, exercise, healthy diet, and avoidance of smoking.  I reviewed stroke warning signs and symptoms and appropriate actions to take if such occurs.   Thank you very much for the opportunity to participate in the care of this patient.  Please do not hesitate to call if any questions or concerns arise.  Orders Placed This Encounter  Procedures  . [place] TCD COMPLETE    Standing  Status: Future     Number of Occurrences:      Standing Expiration Date: 02/10/2016    Order Specific Question:  Reason for Exam (SYMPTOM  OR DIAGNOSIS REQUIRED)    Answer:  transient memory difficulty    Order  Specific Question:  Preferred imaging location?    Answer:  Internal  . US Carotid Bilateral    Standing Status: Future     Number of Occurrences:      Standing Expiration Date: 02/10/2016    Order Specific Question:  Reason for Exam (SYMPTOM  OR DIAGNOSIS REQUIRED)    Answer:  transient memory difficulty    Order Specific Question:  Preferred imaging location?    Answer:  Internal  . TSH + free T4  . Hemoglobin A1c  . Lipid panel  . 2D Echocardiogram with contrast    Standing Status: Future     Number of Occurrences:      Standing Expiration Date: 12/10/2015    Order Specific Question:  Type of Echo    Answer:  Complete    Order Specific Question:  Where should this test be performed    Answer:  [facility])    Order Specific Question:  Reason for exam-Echo    Answer:  TIA  435.9 / G45.9  . EEG adult    Standing Status: Future     Number of Occurrences:      Standing Expiration Date: 12/09/2015    Meds ordered this encounter  Medications  . PROAIR HFA 108 (90 BASE) MCG/ACT inhaler    Sig: Inhale 2 puffs into the lungs as needed.    Refill:  2  . esomeprazole (NEXIUM) 20 MG capsule    Sig: Take 20 mg by mouth daily at 12 noon.  Marland Kitchen aspirin EC 81 MG tablet    Sig: Take 1 tablet (81 mg total) by mouth daily.    Patient Instructions  - low suspicious for stroke or seizure, but would like to rule them out. - will do some work up to rule out stroke or seizure - will do EEG, carotid doppler, head ultrasound, and 2D echo - recommend ASA 81 mg for stroke prevention - will check A1C and lipid panel - Follow up with your primary care physician for stroke risk factor modification. Recommend maintain blood pressure goal <130/80, diabetes with hemoglobin  A1c goal below 6.5% and lipids with LDL cholesterol goal below 70 mg/dL.  - follow up in 2 months.    Rosalin Hawking, MD PhD [facility] [address] [place], [place] [address] [phone]

## 2014-12-09 NOTE — Patient Instructions (Signed)
-   low suspicious for stroke or seizure, but would like to rule them out. - will do some work up to rule out stroke or seizure - will do EEG, carotid doppler, head ultrasound, and 2D echo - recommend ASA 81 mg for stroke prevention - will check A1C and lipid panel - Follow up with your primary care physician for stroke risk factor modification. Recommend maintain blood pressure goal <130/80, diabetes with hemoglobin A1c goal below 6.5% and lipids with LDL cholesterol goal below 70 mg/dL.  - follow up in 2 months.

## 2014-12-10 LAB — LIPID PANEL
CHOLESTEROL TOTAL: 262 mg/dL — AB (ref 100–199)
Chol/HDL Ratio: 6 ratio units — ABNORMAL HIGH (ref 0.0–4.4)
HDL: 44 mg/dL (ref >39)
LDL CALC: 182 mg/dL — AB (ref 0–99)
TRIGLYCERIDES: 181 mg/dL — AB (ref 0–149)
VLDL Cholesterol Cal: 36 mg/dL (ref 5–40)

## 2014-12-10 LAB — HEMOGLOBIN A1C
Est. average glucose Bld gHb Est-mCnc: 120 mg/dL
Hgb A1c MFr Bld: 5.8 % — ABNORMAL HIGH (ref 4.8–5.6)

## 2014-12-10 LAB — TSH+FREE T4
Free T4: 0.95 ng/dL (ref 0.82–1.77)
TSH: 2.41 u[IU]/mL (ref 0.450–4.500)

## 2014-12-12 ENCOUNTER — Telehealth: Payer: Self-pay | Admitting: Neurology

## 2014-12-12 NOTE — Telephone Encounter (Signed)
Patient is calling and asking if the MRI she had would show Chiari present.  Please call.

## 2014-12-13 NOTE — Telephone Encounter (Signed)
Called pt and discussed with her over the phone. Answered all her questions to her satisfaction.   Rosalin Hawking, MD PhD Stroke Neurology 12/13/2014 1:56 PM

## 2014-12-13 NOTE — Progress Notes (Signed)
Faxed

## 2014-12-13 NOTE — Telephone Encounter (Signed)
Spoke with patient and informed her that Dr Erlinda Hong is in the hospital this week and he would have to answer this questions, patient stated that her grandson has Chiari, and was wondering if she had this. Dr Erlinda Hong please advise.

## 2014-12-13 NOTE — Progress Notes (Signed)
I will fax that today

## 2014-12-14 ENCOUNTER — Ambulatory Visit (INDEPENDENT_AMBULATORY_CARE_PROVIDER_SITE_OTHER): Payer: BLUE CROSS/BLUE SHIELD | Admitting: Neurology

## 2014-12-14 ENCOUNTER — Telehealth: Payer: Self-pay | Admitting: Neurology

## 2014-12-14 DIAGNOSIS — R413 Other amnesia: Secondary | ICD-10-CM

## 2014-12-14 DIAGNOSIS — R569 Unspecified convulsions: Secondary | ICD-10-CM | POA: Insufficient documentation

## 2014-12-14 HISTORY — DX: Unspecified convulsions: R56.9

## 2014-12-14 MED ORDER — LACOSAMIDE 100 MG PO TABS: 100.0000 mg | ORAL_TABLET | Freq: Two times a day (BID) | ORAL | Status: DC

## 2014-12-14 NOTE — Procedures (Signed)
      History: [name] is a 64 year old patient with a history of an episode of confusion that occurred on 12/06/2014. The patient was at work, and suddenly cannot remember how to do her job. The patient is being evaluated for this event. MRI evaluation of the brain was unremarkable.  This is a routine EEG. No skull defects are noted. Medications include aspirin, Tenormin, Pulmicort, Nexium, Singulair, multivitamins, Pro-air, and Zoloft.  EEG classification: Dysrhythmia grade 2 left temporal  Description of the recording: The background rhythms of this recording consists of a fairly well modulated medium amplitude alpha rhythm of 9 Hz that is reactive to eye opening and closure. As the record progresses, the patient initially is in the waking state, but towards the end of the recording, she enters the drowsy state, but never enters stage II sleep. Intermittently during the recording, there appears to be evidence of dysrhythmic theta activity emanating from the left mid temporal region. At no time during the recording does there appear to be evidence of actual spike or spike-wave discharges. Photic stimulation is performed, and this results in a bilateral and symmetric photic driving response. Hyperventilation is also performed, and this results in a minimal buildup of the background rhythm activities without significant slowing seen. Cardiac monitor shows no evidence of cardiac rhythm abnormalities with a heart rate of 56.  Impression: This is an abnormal EEG recording secondary to dysrhythmic activity emanating from the left temporal region. This study suggests a left brain abnormality, with a lowered seizure threshold. No electrographic seizures were recorded, however.

## 2014-12-14 NOTE — Telephone Encounter (Signed)
Discussed with the patient over the phone regarding her EEG result today. It showed dysrhythmic activities on the left temporal region, which could potentially explain her memory loss event. Not sure if her memory loss episode is due to left temporal seizure, but we would like to start treatment with Vimpat for further seizure prophylaxis. She expressed understanding and appreciation, medication Wo prescribed to her pharmacy, and she is going to make appointment to be seen in 2 months.  Rosalin Hawking, MD PhD Stroke Neurology 12/14/2014 6:26 PM

## 2014-12-15 ENCOUNTER — Ambulatory Visit (HOSPITAL_COMMUNITY): Payer: BLUE CROSS/BLUE SHIELD | Attending: Cardiology

## 2014-12-15 DIAGNOSIS — E785 Hyperlipidemia, unspecified: Secondary | ICD-10-CM | POA: Insufficient documentation

## 2014-12-15 DIAGNOSIS — G459 Transient cerebral ischemic attack, unspecified: Secondary | ICD-10-CM | POA: Diagnosis not present

## 2014-12-15 DIAGNOSIS — I10 Essential (primary) hypertension: Secondary | ICD-10-CM | POA: Diagnosis not present

## 2014-12-15 DIAGNOSIS — R413 Other amnesia: Secondary | ICD-10-CM

## 2014-12-15 NOTE — Progress Notes (Signed)
2D Echo completed. 12/15/2014

## 2014-12-16 ENCOUNTER — Telehealth: Payer: Self-pay | Admitting: Radiology

## 2014-12-16 NOTE — Telephone Encounter (Signed)
12/15/14 left message on home and cell phone voice message. Call us regarding scheduling the doppler studies.

## 2014-12-19 ENCOUNTER — Telehealth: Payer: Self-pay

## 2014-12-19 NOTE — Telephone Encounter (Signed)
-----   Message from Rosalin Hawking, MD sent at 12/16/2014  2:29 PM EDT ----- Hello, could you please let the patient note that her 2-D echo was normal. Continue current treatment plan. Thank you.  Rosalin Hawking, MD PhD Stroke Neurology 12/16/2014 2:29 PM

## 2014-12-19 NOTE — Telephone Encounter (Signed)
Left voicemail on patient's cell and home phone relaying results.

## 2014-12-29 ENCOUNTER — Telehealth: Payer: Self-pay | Admitting: *Deleted

## 2014-12-29 ENCOUNTER — Telehealth: Payer: Self-pay | Admitting: Neurology

## 2014-12-29 NOTE — Telephone Encounter (Signed)
Per patient Lovey Newcomer, RN informed patient to call Charisse Klinefelter, RN to discuss why and what is needed on Medical Form from [employer] .  Please call and advise.

## 2014-12-29 NOTE — Telephone Encounter (Signed)
Form,[employer] sent to [facility] and Dr Erlinda Hong 12-29-14.

## 2014-12-30 DIAGNOSIS — Z0289 Encounter for other administrative examinations: Secondary | ICD-10-CM

## 2015-01-03 NOTE — Telephone Encounter (Signed)
Form filled out by Dr. Erlinda Hong.  To MR today.

## 2015-01-03 NOTE — Telephone Encounter (Signed)
Form,[employer] received,completed by Dr Erlinda Hong and Lovey Newcomer faxed 01-03-15.

## 2015-01-11 ENCOUNTER — Ambulatory Visit (INDEPENDENT_AMBULATORY_CARE_PROVIDER_SITE_OTHER): Payer: BLUE CROSS/BLUE SHIELD

## 2015-01-11 ENCOUNTER — Telehealth: Payer: Self-pay | Admitting: *Deleted

## 2015-01-11 DIAGNOSIS — R413 Other amnesia: Secondary | ICD-10-CM | POA: Diagnosis not present

## 2015-01-11 DIAGNOSIS — Z0289 Encounter for other administrative examinations: Secondary | ICD-10-CM

## 2015-01-11 NOTE — Telephone Encounter (Signed)
Patient being seen for carotid doppler; she is c/o increased dizziness, shakiness and balance issues since beginning unknown new medication prescribed by Dr Erlinda Hong. Called [facility], [place] and verified she was prescribed Vimpat 100 mg twice daily on 12/14/14.  She states she "sometimes feels like her brain is spinning inside her head". She is questioning if she should continue taking this medication. She states Dr Erlinda Hong informed her he wanted her to take Vimpat x 3 months. She states she is having difficulty working and driving. Informed her that Dr Erlinda Hong is in hospital this week, Dr Leonie Man is covering. Informed her this RN will route her concerns, questions to Dr Leonie Man and call her with his reply.

## 2015-01-11 NOTE — Telephone Encounter (Signed)
Called pt's cell phone at her request and left vm with Dr Clydene Fake instructions re: Vimpat as seen in previous note. Left this caller's name and number for any further questions, concerns.

## 2015-01-11 NOTE — Telephone Encounter (Signed)
I have reviewed the patient's chart it is unclear whether she had a definite seizure and EEG shows focal slowing and mild irritability but no definite seizure activity. I recommend she reduce Vimpat to 1 tablet for today and discontinue tomorrow. If her symptoms improve she does not need to go back on it and even if she has recurrent seizures may then   consider alternative seizure medications. Ask her to keep her scheduled follow-up with Dr. Erlinda Hong and call earlier if necessary

## 2015-02-10 ENCOUNTER — Telehealth: Payer: Self-pay | Admitting: Neurology

## 2015-02-10 NOTE — Telephone Encounter (Signed)
Discussed with patient over the phone TCD and carotid Doppler results for her. And confirmed that her appointment on 02/17/2015 will be canceled. She stated that she feels great at this moment and does not need follow-up next week. We will see her as needed. She expressed understanding and appreciation.  Rosalin Hawking, MD PhD Stroke Neurology 02/10/2015 4:35 PM

## 2015-02-10 NOTE — Telephone Encounter (Signed)
Patient to cancel appointment on 02/17/15. She states she is not having any problems currently.  She also inquired about result from doppler study. Please call and advise. Patient can be reached at [phone] and [phone].

## 2015-02-17 ENCOUNTER — Ambulatory Visit: Payer: BLUE CROSS/BLUE SHIELD | Admitting: Neurology

## 2015-03-28 ENCOUNTER — Encounter (INDEPENDENT_AMBULATORY_CARE_PROVIDER_SITE_OTHER): Payer: Self-pay | Admitting: *Deleted

## 2015-04-13 ENCOUNTER — Encounter (INDEPENDENT_AMBULATORY_CARE_PROVIDER_SITE_OTHER): Payer: Self-pay | Admitting: *Deleted

## 2015-04-13 ENCOUNTER — Other Ambulatory Visit (INDEPENDENT_AMBULATORY_CARE_PROVIDER_SITE_OTHER): Payer: Self-pay | Admitting: *Deleted

## 2015-04-13 DIAGNOSIS — Z1211 Encounter for screening for malignant neoplasm of colon: Secondary | ICD-10-CM

## 2015-05-18 ENCOUNTER — Telehealth (INDEPENDENT_AMBULATORY_CARE_PROVIDER_SITE_OTHER): Payer: Self-pay | Admitting: *Deleted

## 2015-05-18 MED ORDER — PEG 3350-KCL-NA BICARB-NACL 420 G PO SOLR: 4000.0000 mL | Freq: Once | ORAL | Status: DC

## 2015-05-18 NOTE — Telephone Encounter (Signed)
Patient needs trilyte 

## 2015-06-15 ENCOUNTER — Telehealth (INDEPENDENT_AMBULATORY_CARE_PROVIDER_SITE_OTHER): Payer: Self-pay | Admitting: *Deleted

## 2015-06-15 NOTE — Telephone Encounter (Signed)
Referring MD/PCP: nyland   Procedure: tcs  Reason/Indication:  screening  Has patient had this procedure before?  Yes, 10 yrs ago  If so, when, by whom and where?    Is there a family history of colon cancer?  no  Who?  What age when diagnosed?    Is patient diabetic?   no      Does patient have prosthetic heart valve?  no  Do you have a pacemaker?  no  Has patient ever had endocarditis? no  Has patient had joint replacement within last 12 months?  no  Does patient tend to be constipated or take laxatives? no  Does patient have a history of alcohol/drug use? no  Is patient on Coumadin, Plavix and/or Aspirin? yes  Medications: asa 81 mg daily, nexium daily, atenolol 50 mg daily, singulair 10 mg daily, zoloft 100 mg daily, multi vit, vit d 3 daily, allergy med prn  Allergies: tetracycline, naproxen, e-mycin  Medication Adjustment: asa 2 days  Procedure date & time: 07/12/15 at 1030

## 2015-06-20 NOTE — Telephone Encounter (Signed)
agree

## 2015-07-12 ENCOUNTER — Ambulatory Visit (HOSPITAL_COMMUNITY)
Admission: RE | Admit: 2015-07-12 | Discharge: 2015-07-12 | Disposition: A | Discharge status: Home / Self Care | Payer: BLUE CROSS/BLUE SHIELD | Source: Ambulatory Visit | Attending: Internal Medicine | Admitting: Internal Medicine

## 2015-07-12 ENCOUNTER — Encounter (HOSPITAL_COMMUNITY)
Admission: RE | Disposition: A | Discharge status: Home / Self Care | Payer: Self-pay | Source: Ambulatory Visit | Attending: Internal Medicine

## 2015-07-12 ENCOUNTER — Encounter (HOSPITAL_COMMUNITY): Payer: Self-pay | Admitting: *Deleted

## 2015-07-12 DIAGNOSIS — Z888 Allergy status to other drugs, medicaments and biological substances status: Secondary | ICD-10-CM | POA: Diagnosis not present

## 2015-07-12 DIAGNOSIS — Z91040 Latex allergy status: Secondary | ICD-10-CM | POA: Insufficient documentation

## 2015-07-12 DIAGNOSIS — E785 Hyperlipidemia, unspecified: Secondary | ICD-10-CM | POA: Diagnosis not present

## 2015-07-12 DIAGNOSIS — Z1211 Encounter for screening for malignant neoplasm of colon: Secondary | ICD-10-CM | POA: Insufficient documentation

## 2015-07-12 DIAGNOSIS — K648 Other hemorrhoids: Secondary | ICD-10-CM | POA: Diagnosis not present

## 2015-07-12 DIAGNOSIS — K644 Residual hemorrhoidal skin tags: Secondary | ICD-10-CM | POA: Diagnosis not present

## 2015-07-12 DIAGNOSIS — D123 Benign neoplasm of transverse colon: Secondary | ICD-10-CM | POA: Diagnosis not present

## 2015-07-12 DIAGNOSIS — J45909 Unspecified asthma, uncomplicated: Secondary | ICD-10-CM | POA: Diagnosis not present

## 2015-07-12 DIAGNOSIS — F329 Major depressive disorder, single episode, unspecified: Secondary | ICD-10-CM | POA: Diagnosis not present

## 2015-07-12 DIAGNOSIS — I10 Essential (primary) hypertension: Secondary | ICD-10-CM | POA: Insufficient documentation

## 2015-07-12 DIAGNOSIS — Z91030 Bee allergy status: Secondary | ICD-10-CM | POA: Insufficient documentation

## 2015-07-12 DIAGNOSIS — Z7982 Long term (current) use of aspirin: Secondary | ICD-10-CM | POA: Diagnosis not present

## 2015-07-12 DIAGNOSIS — Z85828 Personal history of other malignant neoplasm of skin: Secondary | ICD-10-CM | POA: Diagnosis not present

## 2015-07-12 DIAGNOSIS — Z881 Allergy status to other antibiotic agents status: Secondary | ICD-10-CM | POA: Diagnosis not present

## 2015-07-12 DIAGNOSIS — Z886 Allergy status to analgesic agent status: Secondary | ICD-10-CM | POA: Insufficient documentation

## 2015-07-12 HISTORY — PX: COLONOSCOPY: SHX5424

## 2015-07-12 SURGERY — COLONOSCOPY
Anesthesia: Moderate Sedation

## 2015-07-12 MED ORDER — MEPERIDINE HCL 50 MG/ML IJ SOLN
INTRAMUSCULAR | Status: AC
  Filled 2015-07-12: qty 1

## 2015-07-12 MED ORDER — MEPERIDINE HCL 50 MG/ML IJ SOLN
INTRAMUSCULAR | Status: DC | PRN
  Administered 2015-07-12 (×4): 25 mg via INTRAVENOUS

## 2015-07-12 MED ORDER — MIDAZOLAM HCL 5 MG/5ML IJ SOLN
INTRAMUSCULAR | Status: AC
  Filled 2015-07-12: qty 10

## 2015-07-12 MED ORDER — STERILE WATER FOR IRRIGATION IR SOLN
Status: DC | PRN
  Administered 2015-07-12: 11:00:00

## 2015-07-12 MED ORDER — MIDAZOLAM HCL 5 MG/5ML IJ SOLN
INTRAMUSCULAR | Status: DC | PRN
  Administered 2015-07-12 (×3): 2 mg via INTRAVENOUS

## 2015-07-12 MED ORDER — SODIUM CHLORIDE 0.9 % IV SOLN
INTRAVENOUS | Status: DC
  Administered 2015-07-12: 1000 mL via INTRAVENOUS

## 2015-07-12 NOTE — Discharge Instructions (Signed)
No aspirin or NSAIDs for 1 week. Resume other medications and diet as before. No driving for 24 hours. Physician will call with biopsy results.  Colonoscopy, Care After These instructions give you information on caring for yourself after your procedure. Your doctor may also give you more specific instructions. Call your doctor if you have any problems or questions after your procedure. HOME CARE  Do not drive for 24 hours.  Do not sign important papers or use machinery for 24 hours.  You may shower.  You may go back to your usual activities, but go slower for the first 24 hours.  Take rest breaks often during the first 24 hours.  Walk around or use warm packs on your belly (abdomen) if you have belly cramping or gas.  Drink enough fluids to keep your pee (urine) clear or pale yellow.  Resume your normal diet. Avoid heavy or fried foods.  Avoid drinking alcohol for 24 hours or as told by your doctor.  Only take medicines as told by your doctor. If a tissue sample (biopsy) was taken during the procedure:   Do not take aspirin or blood thinners for 7 days, or as told by your doctor.  Do not drink alcohol for 7 days, or as told by your doctor.  Eat soft foods for the first 24 hours. GET HELP IF: You still have a small amount of blood in your poop (stool) 2-3 days after the procedure. GET HELP RIGHT AWAY IF:  You have more than a small amount of blood in your poop.  You see clumps of tissue (blood clots) in your poop.  Your belly is puffy (swollen).  You feel sick to your stomach (nauseous) or throw up (vomit).  You have a fever.  You have belly pain that gets worse and medicine does not help. MAKE SURE YOU:  Understand these instructions.  Will watch your condition.  Will get help right away if you are not doing well or get worse.   This information is not intended to replace advice given to you by your health care provider. Make sure you discuss any questions  you have with your health care provider.   Document Released: 10/05/2010 Document Revised: 09/07/2013 Document Reviewed: 05/10/2013 Elsevier Interactive Patient Education 2016 [employer].   Hemorrhoids Hemorrhoids are swollen veins around the rectum or anus. There are two types of hemorrhoids:   Internal hemorrhoids. These occur in the veins just inside the rectum. They may poke through to the outside and become irritated and painful.  External hemorrhoids. These occur in the veins outside the anus and can be felt as a painful swelling or hard lump near the anus. CAUSES  Pregnancy.   Obesity.   Constipation or diarrhea.   Straining to have a bowel movement.   Sitting for long periods on the toilet.  Heavy lifting or other activity that caused you to strain.  Anal intercourse. SYMPTOMS   Pain.   Anal itching or irritation.   Rectal bleeding.   Fecal leakage.   Anal swelling.   One or more lumps around the anus.  DIAGNOSIS  Your caregiver may be able to diagnose hemorrhoids by visual examination. Other examinations or tests that may be performed include:   Examination of the rectal area with a gloved hand (digital rectal exam).   Examination of anal canal using a small tube (scope).   A blood test if you have lost a significant amount of blood.  A test to look inside  the colon (sigmoidoscopy or colonoscopy). TREATMENT Most hemorrhoids can be treated at home. However, if symptoms do not seem to be getting better or if you have a lot of rectal bleeding, your caregiver may perform a procedure to help make the hemorrhoids get smaller or remove them completely. Possible treatments include:   Placing a rubber band at the base of the hemorrhoid to cut off the circulation (rubber band ligation).   Injecting a chemical to shrink the hemorrhoid (sclerotherapy).   Using a tool to burn the hemorrhoid (infrared light therapy).   Surgically removing the  hemorrhoid (hemorrhoidectomy).   Stapling the hemorrhoid to block blood flow to the tissue (hemorrhoid stapling).  HOME CARE INSTRUCTIONS   Eat foods with fiber, such as whole grains, beans, nuts, fruits, and vegetables. Ask your doctor about taking products with added fiber in them (fibersupplements).  Increase fluid intake. Drink enough water and fluids to keep your urine clear or pale yellow.   Exercise regularly.   Go to the bathroom when you have the urge to have a bowel movement. Do not wait.   Avoid straining to have bowel movements.   Keep the anal area dry and clean. Use wet toilet paper or moist towelettes after a bowel movement.   Medicated creams and suppositories may be used or applied as directed.   Only take over-the-counter or prescription medicines as directed by your caregiver.   Take warm sitz baths for 15-20 minutes, 3-4 times a day to ease pain and discomfort.   Place ice packs on the hemorrhoids if they are tender and swollen. Using ice packs between sitz baths may be helpful.   Put ice in a plastic bag.   Place a towel between your skin and the bag.   Leave the ice on for 15-20 minutes, 3-4 times a day.   Do not use a donut-shaped pillow or sit on the toilet for long periods. This increases blood pooling and pain.  SEEK MEDICAL CARE IF:  You have increasing pain and swelling that is not controlled by treatment or medicine.  You have uncontrolled bleeding.  You have difficulty or you are unable to have a bowel movement.  You have pain or inflammation outside the area of the hemorrhoids. MAKE SURE YOU:  Understand these instructions.  Will watch your condition.  Will get help right away if you are not doing well or get worse.   This information is not intended to replace advice given to you by your health care provider. Make sure you discuss any questions you have with your health care provider.   Document Released: 08/30/2000  Document Revised: 08/19/2012 Document Reviewed: 07/07/2012 Elsevier Interactive Patient Education 2016 Elsevier Inc. Colon Polyps Polyps are lumps of extra tissue growing inside the body. Polyps can grow in the large intestine (colon). Most colon polyps are noncancerous (benign). However, some colon polyps can become cancerous over time. Polyps that are larger than a pea may be harmful. To be safe, caregivers remove and test all polyps. CAUSES  Polyps form when mutations in the genes cause your cells to grow and divide even though no more tissue is needed. RISK FACTORS There are a number of risk factors that can increase your chances of getting colon polyps. They include:  Being older than 50 years.  Family history of colon polyps or colon cancer.  Long-term colon diseases, such as colitis or Crohn disease.  Being overweight.  Smoking.  Being inactive.  Drinking too much alcohol. SYMPTOMS  Most small polyps do not cause symptoms. If symptoms are present, they may include:  Blood in the stool. The stool may look dark red or black.  Constipation or diarrhea that lasts longer than 1 week. DIAGNOSIS People often do not know they have polyps until their caregiver finds them during a regular checkup. Your caregiver can use 4 tests to check for polyps:  Digital rectal exam. The caregiver wears gloves and feels inside the rectum. This test would find polyps only in the rectum.  Barium enema. The caregiver puts a liquid called barium into your rectum before taking X-rays of your colon. Barium makes your colon look white. Polyps are dark, so they are easy to see in the X-ray pictures.  Sigmoidoscopy. A thin, flexible tube (sigmoidoscope) is placed into your rectum. The sigmoidoscope has a light and tiny camera in it. The caregiver uses the sigmoidoscope to look at the last third of your colon.  Colonoscopy. This test is like sigmoidoscopy, but the caregiver looks at the entire colon. This  is the most common method for finding and removing polyps. TREATMENT  Any polyps will be removed during a sigmoidoscopy or colonoscopy. The polyps are then tested for cancer. PREVENTION  To help lower your risk of getting more colon polyps:  Eat plenty of fruits and vegetables. Avoid eating fatty foods.  Do not smoke.  Avoid drinking alcohol.  Exercise every day.  Lose weight if recommended by your caregiver.  Eat plenty of calcium and folate. Foods that are Conkel in calcium include milk, cheese, and broccoli. Foods that are Gemme in folate include chickpeas, kidney beans, and spinach. HOME CARE INSTRUCTIONS Keep all follow-up appointments as directed by your caregiver. You may need periodic exams to check for polyps. SEEK MEDICAL CARE IF: You notice bleeding during a bowel movement.   This information is not intended to replace advice given to you by your health care provider. Make sure you discuss any questions you have with your health care provider.   Document Released: 05/29/2004 Document Revised: 09/23/2014 Document Reviewed: 11/12/2011 Elsevier Interactive Patient Education [employer].

## 2015-07-12 NOTE — H&P (Signed)
[name] is an 64 y.o. female.   Chief Complaint: Patient is here for colonoscopy. HPI: [name] is 64 year old Caucasian female who is here for screening colonoscopy. [name] denies abdominal pain change in bowel habits or rectal bleeding. Last colonoscopy was in September 2004 in [place] when [name] was found to be anemic. Exam was normal. Family history is negative for CRC.  Past Medical History  Diagnosis Date  . Asthma   . Allergy   . Hypertension   . Cancer ([place])     skin cancer  . Depression   . Back pain   . Hyperlipidemia     Past Surgical History  Procedure Laterality Date  . Tonsilectomy/adenoidectomy with myringotomy  1958  . Ovarian cyst removal  1987  . Bladder mesh    . Vaginal delivery      x3  . Transthoracic echocardiogram  02/2007    RV mildly dlated; LV normal in size; mild MR with thickened MV leaflets; mild TR; mild pulm valve regurg  . Nm myocar perf wall motion  02/2007    dipyridamole myoview; EF 70%, no wall motion abnormalities, no inducible ischemia, low risk   . Tonsillectomy      Family History  Problem Relation Age of Onset  . Heart disease Mother   . Kidney disease Mother   . Breast cancer Mother   . Coronary artery disease Mother     CABG at age 35  . Dementia Father   . Stroke Father   . Stroke Maternal Grandmother   . Cancer Maternal Grandfather   . Cancer Paternal Grandmother   . Kidney disease Paternal Grandfather    Social History:  reports that [name] has never smoked. [name] has never used smokeless tobacco. [name] reports that [name] does not drink alcohol or use illicit drugs.  Allergies:  Allergies  Allergen Reactions  . Bee Venom Swelling  . Crestor [Rosuvastatin] Other (See Comments)    Myalgias  . Aleve [Naproxen Sodium] Other (See Comments)    unknown  . Erythromycin Itching and Rash  . Latex Itching and Rash  . Naproxen Other (See Comments)    unknown  . Tetracyclines & Related Other (See Comments)    unknown  . Vytorin  [Ezetimibe-Simvastatin] Other (See Comments)    unknown    Medications Prior to Admission  Medication Sig Dispense Refill  . aspirin EC 81 MG tablet Take 1 tablet (81 mg total) by mouth daily.    Marland Kitchen atenolol (TENORMIN) 50 MG tablet Take 50 mg by mouth daily.    . Biotin 1000 MCG tablet Take 1,000 mcg by mouth daily.    Marland Kitchen esomeprazole (NEXIUM) 20 MG capsule Take 20 mg by mouth daily at 12 noon.    . montelukast (SINGULAIR) 10 MG tablet Take 10 mg by mouth at bedtime.    . Multiple Vitamin (MULTIVITAMIN) capsule Take 1 capsule by mouth 2 (two) times a week.     . polyethylene glycol-electrolytes (NULYTELY/GOLYTELY) 420 G solution Take 4,000 mLs by mouth once. 4000 mL 0  . PROAIR HFA 108 (90 BASE) MCG/ACT inhaler Inhale 2 puffs into the lungs as needed.  2  . sertraline (ZOLOFT) 100 MG tablet Take 50-100 mg by mouth every other day.     . budesonide (PULMICORT) 180 MCG/ACT inhaler Inhale 2 puffs into the lungs 2 (two) times daily as needed (for shortness of breath).     . Lacosamide 100 MG TABS Take 1 tablet (100 mg total) by mouth 2 (  two) times daily. 60 tablet 3    No results found for this or any previous visit (from the past 48 hour(s)). No results found.  ROS  Blood pressure 139/89, pulse 64, temperature 98.9 F (37.2 C), temperature source Oral, resp. rate 12, height 5\' 6"  (1.676 m), weight 190 lb (86.183 kg), SpO2 99 %. Physical Exam  Constitutional: [name] appears well-developed and well-nourished.  HENT:  Mouth/Throat: Oropharynx is clear and moist.  Eyes: Conjunctivae are normal. No scleral icterus.  Neck: No thyromegaly present.  Cardiovascular: Normal rate, regular rhythm and normal heart sounds.   No murmur heard. Respiratory: Effort normal and breath sounds normal.  GI: Soft. [name] exhibits no distension and no mass. There is no tenderness.  Musculoskeletal: [name] exhibits no edema.  Lymphadenopathy:    [name] has no cervical adenopathy.  Neurological: [name] is alert.  Skin:  Skin is warm and dry.     Assessment/Plan Average risk screening colonoscopy.  Raymonda Pell U 07/12/2015, 10:45 AM

## 2015-07-12 NOTE — Op Note (Signed)
COLONOSCOPY PROCEDURE REPORT  PATIENT:  [name]  MR#:  [id number] Birthdate:  Nov 21, 1950, 64 y.o., female Endoscopist:  Dr. Rogene Houston, MD Referred By:  Dr. Margarita Rana, MD  Procedure Date: 07/12/2015  Procedure:   Colonoscopy with snare polypectomy  Indications:  Patient is 64 year old Caucasian female was undergoing average risk screening colonoscopy. Last colonoscopy was in September 2004 and was normal.  Informed Consent:  The procedure and risks were reviewed with the patient and informed consent was obtained.  Medications:  Demerol 100 mg IV Versed 6 mg IV  Description of procedure:  After a digital rectal exam was performed, that colonoscope was advanced from the anus through the rectum and colon to the area of the cecum, ileocecal valve and appendiceal orifice. The cecum was deeply intubated. These structures were well-seen and photographed for the record. From the level of the cecum and ileocecal valve, the scope was slowly and cautiously withdrawn. The mucosal surfaces were carefully surveyed utilizing scope tip to flexion to facilitate fold flattening as needed. The scope was pulled down into the rectum where a thorough exam including retroflexion was performed. Pediatric colonoscope was exchanged with slim scope in order to complete examination.  Findings:   Prep excellent. Normal mucosa of cecum, ascending colon, hepatic flexure and transverse colon. 7 mm polyp hot snared from splenic flexure. Mucosa of descending and sigmoid colon was normal. Normal rectal mucosa. Small hemorrhoids below the dentate line.    Therapeutic/Diagnostic Maneuvers Performed:  See above  Complications:  None  EBL: None  Cecal Withdrawal Time:  14 minutes  Impression:  Examination performed to cecum. 7 mm polyp hot snared from splenic flexure. Small external hemorrhoids.  Recommendations:  Standard instructions given. No aspirin or NSAIDs for 1 week. I will  contact patient with biopsy results and further recommendations.  Valerya Maxton U  07/12/2015 11:40 AM  CC: Dr. Bridget Hartshorn, RN & Dr. Rayne Du ref. provider found

## 2015-07-14 ENCOUNTER — Encounter (HOSPITAL_COMMUNITY): Payer: Self-pay | Admitting: Internal Medicine

## 2016-02-13 DIAGNOSIS — K219 Gastro-esophageal reflux disease without esophagitis: Secondary | ICD-10-CM | POA: Insufficient documentation

## 2016-02-13 DIAGNOSIS — E559 Vitamin D deficiency, unspecified: Secondary | ICD-10-CM | POA: Insufficient documentation

## 2016-02-13 DIAGNOSIS — E669 Obesity, unspecified: Secondary | ICD-10-CM | POA: Insufficient documentation

## 2016-05-14 DIAGNOSIS — F411 Generalized anxiety disorder: Secondary | ICD-10-CM | POA: Insufficient documentation

## 2016-06-05 ENCOUNTER — Telehealth: Payer: Self-pay | Admitting: Internal Medicine

## 2016-06-05 NOTE — Telephone Encounter (Signed)
Received records from [place] for appointment on 06/21/16 with Dr Debara Pickett.  Records given to [facility] (medical records) for Dr [facility] schedule on 06/21/16. lp

## 2016-06-21 ENCOUNTER — Ambulatory Visit (INDEPENDENT_AMBULATORY_CARE_PROVIDER_SITE_OTHER): Payer: BLUE CROSS/BLUE SHIELD | Admitting: Internal Medicine

## 2016-06-21 ENCOUNTER — Encounter: Payer: Self-pay | Admitting: Internal Medicine

## 2016-06-21 VITALS — BP 151/88 | HR 63 | Ht 66.0 in | Wt 183.4 lb

## 2016-06-21 DIAGNOSIS — R0789 Other chest pain: Secondary | ICD-10-CM

## 2016-06-21 DIAGNOSIS — Z79899 Other long term (current) drug therapy: Secondary | ICD-10-CM | POA: Diagnosis not present

## 2016-06-21 DIAGNOSIS — I10 Essential (primary) hypertension: Secondary | ICD-10-CM | POA: Diagnosis not present

## 2016-06-21 DIAGNOSIS — E782 Mixed hyperlipidemia: Secondary | ICD-10-CM

## 2016-06-21 MED ORDER — ATENOLOL-CHLORTHALIDONE 50-25 MG PO TABS: 1.0000 | ORAL_TABLET | Freq: Every day | ORAL | 11 refills | Status: DC

## 2016-06-21 NOTE — Progress Notes (Signed)
OFFICE NOTE  Chief Complaint:  Chest pain, blood pressure elevated  Primary Care Physician: Tanya Hartshorn, RN  HPI:  [name] is a 65 y.o. female Who I previously seen in 2011 for palpitations and dyslipidemia. She is a former patient of Dr. Janene Simmons. Her mother [name] was a patient of mine who died recently. I have not seen her since 2011 although she had an appointment scheduled in 2015 which was canceled. Recently she's noted that she's had some elevated blood pressures. She was preparing for a beach trip and had some chest discomfort. This lasted for 2 or 3 hours and was described as a vertical line down the center of her chest. It was more of a dull pressure and resolved spontaneously. She's not had any exertional symptoms however is limited by a pinched nerve in her low back. She did have a cane for which she used to walk today. She does not exercise regularly. She also mentioned an episode where she had about 8 hours of difficulty recalling names and some memory loss after a traumatic event shortly after her mother died. She had an extensive workup for this including an MRI of the brain and ultimately no clear cause was found. She reports very good control of her her palpitations.  PMHx:  Past Medical History:  Diagnosis Date  . Allergy   . Asthma   . Back pain   . Cancer ([place])    skin cancer  . Depression   . Hyperlipidemia   . Hypertension     Past Surgical History:  Procedure Laterality Date  . bladder mesh    . COLONOSCOPY N/A 07/12/2015   Procedure: COLONOSCOPY;  Surgeon: Rogene Houston, MD;  [facility]: [facility];  Service: Endoscopy;  Laterality: N/A;  1030  . NM MYOCAR PERF WALL MOTION  02/2007   dipyridamole myoview; EF 70%, no wall motion abnormalities, no inducible ischemia, low risk   . OVARIAN CYST REMOVAL  1987  . TONSILECTOMY/ADENOIDECTOMY WITH MYRINGOTOMY  1958  . TONSILLECTOMY    . TRANSTHORACIC ECHOCARDIOGRAM  02/2007   RV  mildly dlated; LV normal in size; mild MR with thickened MV leaflets; mild TR; mild pulm valve regurg  . VAGINAL DELIVERY     x3    FAMHx:  Family History  Problem Relation Age of Onset  . Heart disease Mother   . Kidney disease Mother   . Breast cancer Mother   . Coronary artery disease Mother     CABG at age 65  . Dementia Father   . Stroke Father   . Stroke Maternal Grandmother   . Cancer Maternal Grandfather   . Cancer Paternal Grandmother   . Kidney disease Paternal Grandfather     SOCHx:   reports that she has never smoked. She has never used smokeless tobacco. She reports that she does not drink alcohol or use drugs.  ALLERGIES:  Allergies  Allergen Reactions  . Bee Venom Swelling  . Crestor [Rosuvastatin] Other (See Comments)    Myalgias  . Erythromycin Itching, Rash and Other (See Comments)  . Latex Itching and Rash    rash  . Rosuvastatin Calcium Other (See Comments)    Myalgias  . Aleve [Naproxen Sodium] Other (See Comments)    unknown  . Naproxen Other (See Comments)    unknown  . Tetracyclines & Related Other (See Comments)    unknown  . Vytorin [Ezetimibe-Simvastatin] Other (See Comments)    unknown  ROS: Pertinent items noted in HPI and remainder of comprehensive ROS otherwise negative.  HOME MEDS: Current Outpatient Prescriptions on File Prior to Visit  Medication Sig Dispense Refill  . Biotin 1000 MCG tablet Take 1,000 mcg by mouth daily.    . budesonide (PULMICORT) 180 MCG/ACT inhaler Inhale 2 puffs into the lungs 2 (two) times daily as needed (for shortness of breath).     . esomeprazole (NEXIUM) 20 MG capsule Take 20 mg by mouth daily at 12 noon.    . Multiple Vitamin (MULTIVITAMIN) capsule Take 1 capsule by mouth 2 (two) times a week.     Marland Kitchen PROAIR HFA 108 (90 BASE) MCG/ACT inhaler Inhale 2 puffs into the lungs as needed.  2  . Vitamin D, Ergocalciferol, (DRISDOL) 50000 units CAPS capsule Take 50,000 Units by mouth once a week.  12  .  sertraline (ZOLOFT) 100 MG tablet Take 50-100 mg by mouth every other day.      No current facility-administered medications on file prior to visit.     LABS/IMAGING: No results found for this or any previous visit (from the past 48 hour(s)). No results found.  WEIGHTS: Wt Readings from Last 3 Encounters:  06/21/16 183 lb 6.4 oz (83.2 kg)  07/12/15 190 lb (86.2 kg)  12/09/14 187 lb 12.8 oz (85.2 kg)    VITALS: BP (!) 151/88   Pulse 63   Ht 5\' 6"  (1.676 m)   Wt 183 lb 6.4 oz (83.2 kg)   BMI 29.60 kg/m   EXAM: General appearance: alert and no distress Neck: no carotid bruit and no JVD Lungs: clear to auscultation bilaterally Heart: regular rate and rhythm Abdomen: soft, non-tender; bowel sounds normal; no masses,  no organomegaly Extremities: extremities normal, atraumatic, no cyanosis or edema Pulses: 2+ and symmetric Skin: Skin color, texture, turgor normal. No rashes or lesions Neurologic: Grossly normal Psych: Mildly anxious  EKG: Normal sinus rhythm at 63, no ischemic changes  ASSESSMENT: 1. Atypical chest pain 2. Hypertension - uncontrolled 3. Palpitations 4. Dyslipidemia  PLAN: 1.   Mrs. Haste has uncontrolled hypertension and had an episode of Atypical chest pain which has not recurred. This is not very suspicious for angina on a do not feel she needs additional testing at this time however she were to have recurrence we could consider repeat stress testing. She has had higher blood pressure recently which is been consistently elevated, particularly systolics in the [id number] and diastolics close to 90. She would benefit from additional blood pressure agent. She is quite satisfied being on atenolol and therefore we will switch her to atenolol/chlorthalidone 50/25 mg daily. Check a metabolic profile in one week and will plan follow-up in our hypertension clinic in one month. Follow-up with me annually or sooner as necessary if her chest pain symptoms return.  Pixie Casino, MD, [facility] Attending Cardiologist [place] 06/21/2016, 8:54 AM

## 2016-06-21 NOTE — Patient Instructions (Signed)
Medication Instructions:  STOP- Atenolol  START- Atenolol/Chlorthalidone 50/25 mg daily  Labwork: None Ordered  Testing/Procedures: None Ordered  Follow-Up: Your physician recommends that you schedule a follow-up appointment in: Hypertension clinic with Cyril Mourning or [place] physician wants you to follow-up in: 1 Year. You will receive a reminder letter in the mail two months in advance. If you don't receive a letter, please call our office to schedule the follow-up appointment.    Any Other Special Instructions Will Be Listed Below (If Applicable).   If you need a refill on your cardiac medications before your next appointment, please call your pharmacy. \

## 2016-06-29 LAB — BASIC METABOLIC PANEL
BUN: 19 mg/dL (ref 7–25)
CALCIUM: 9.7 mg/dL (ref 8.6–10.4)
CHLORIDE: 99 mmol/L (ref 98–110)
CO2: 28 mmol/L (ref 20–31)
Creat: 1.17 mg/dL — ABNORMAL HIGH (ref 0.50–0.99)
GLUCOSE: 107 mg/dL — AB (ref 65–99)
POTASSIUM: 4.4 mmol/L (ref 3.5–5.3)
SODIUM: 139 mmol/L (ref 135–146)

## 2016-07-01 ENCOUNTER — Encounter: Payer: Self-pay | Admitting: *Deleted

## 2016-07-09 ENCOUNTER — Ambulatory Visit (INDEPENDENT_AMBULATORY_CARE_PROVIDER_SITE_OTHER): Payer: BLUE CROSS/BLUE SHIELD | Admitting: Pharmacist Clinician (PhC)/ Clinical Pharmacy Specialist

## 2016-07-09 VITALS — BP 124/84 | HR 64 | Ht 66.0 in | Wt 182.0 lb

## 2016-07-09 DIAGNOSIS — I10 Essential (primary) hypertension: Secondary | ICD-10-CM | POA: Diagnosis not present

## 2016-07-09 MED ORDER — ATENOLOL-CHLORTHALIDONE 50-25 MG PO TABS: 1.0000 | ORAL_TABLET | Freq: Every day | ORAL | 3 refills | Status: DC

## 2016-07-09 NOTE — Assessment & Plan Note (Addendum)
Today her BP looks well controlled at 124/84.  Will have her repeat BMET in another few days to be sure kidney function does not change.  Otherwise, she can continue to monitor BP a few times each week and let us know should she see any values that concern her.

## 2016-07-09 NOTE — Progress Notes (Deleted)
Patient ID: [name]                 DOB: 02-13-1951                      MRN: [id number]     HPI: [name] is a 65 y.o. female referred by Dr. Debara Pickett - last visit  06/21/2016. PMH significant for HTN, DLD, and palpitations/atypical CP. Per last visit with Dr. Debara Pickett, pt experienced an episode of atypical chest pain which has not recurred. Not presently suspicious for angina but tentative plan to pursue further w/u in the future should episodes recur. BP has been consistently elevated in the 150s/90s on atenolol 50mg  daily. Pt reported she is doing well on atenolol and chlorthalidone 25mg  daily was added for further BP control.     Current HTN meds: Atenolol/chlorthalidone 50/25mg  daily BP goal: <140/90 mmHg  Family History:   Social History:   Diet:   Exercise: No regular exercise  Home BP readings:   Wt Readings from Last 3 Encounters:  06/21/16 183 lb 6.4 oz (83.2 kg)  07/12/15 190 lb (86.2 kg)  12/09/14 187 lb 12.8 oz (85.2 kg)   BP Readings from Last 3 Encounters:  06/21/16 (!) 151/88  07/12/15 125/79  12/09/14 (!) 135/91   Pulse Readings from Last 3 Encounters:  06/21/16 63  07/12/15 63  12/09/14 61    Renal function: CrCl cannot be calculated (Unknown ideal weight.).  Past Medical History:  Diagnosis Date  . Allergy   . Asthma   . Back pain   . Cancer ([place])    skin cancer  . Depression   . Hyperlipidemia   . Hypertension     Current Outpatient Prescriptions on File Prior to Visit  Medication Sig Dispense Refill  . atenolol-chlorthalidone (TENORETIC) 50-25 MG tablet Take 1 tablet by mouth daily. 30 tablet 11  . Biotin 1000 MCG tablet Take 1,000 mcg by mouth daily.    . budesonide (PULMICORT) 180 MCG/ACT inhaler Inhale 2 puffs into the lungs 2 (two) times daily as needed (for shortness of breath).     . esomeprazole (NEXIUM) 20 MG capsule Take 20 mg by mouth daily at 12 noon.    . montelukast (SINGULAIR) 10 MG tablet TAKE 1 BY MOUTH DAILY  --KATHERINE HEMBERG    . Multiple Vitamin (MULTIVITAMIN) capsule Take 1 capsule by mouth 2 (two) times a week.     . pravastatin (PRAVACHOL) 40 MG tablet   3  . PROAIR HFA 108 (90 BASE) MCG/ACT inhaler Inhale 2 puffs into the lungs as needed.  2  . sertraline (ZOLOFT) 100 MG tablet Take 50-100 mg by mouth every other day.     . Vitamin D, Ergocalciferol, (DRISDOL) 50000 units CAPS capsule Take 50,000 Units by mouth once a week.  12   No current facility-administered medications on file prior to visit.     Allergies  Allergen Reactions  . Bee Venom Swelling  . Crestor [Rosuvastatin] Other (See Comments)    Myalgias  . Erythromycin Itching, Rash and Other (See Comments)  . Latex Itching and Rash    rash  . Rosuvastatin Calcium Other (See Comments)    Myalgias  . Aleve [Naproxen Sodium] Other (See Comments)    unknown  . Naproxen Other (See Comments)    unknown  . Tetracyclines & Related Other (See Comments)    unknown  . Vytorin [Ezetimibe-Simvastatin] Other (See Comments)    unknown  Assessment/Plan:

## 2016-07-09 NOTE — Patient Instructions (Signed)
  Your blood pressure today is 124/84  (goal <140/90)  Check your blood pressure at home several times each week and keep record of the readings.  Take your BP meds as follows:  Continue with atenolol/chlorthalidone once daily  Bring all of your meds, your BP cuff and your record of home blood pressures to your next appointment.  Exercise as you're able, try to walk approximately 30 minutes per day.  Keep salt intake to a minimum, especially watch canned and prepared boxed foods.  Eat more fresh fruits and vegetables and fewer canned items.  Avoid eating in fast food restaurants.    HOW TO TAKE YOUR BLOOD PRESSURE: . Rest 5 minutes before taking your blood pressure. .  Don't smoke or drink caffeinated beverages for at least 30 minutes before. . Take your blood pressure before (not after) you eat. . Sit comfortably with your back supported and both feet on the floor (don't cross your legs). . Elevate your arm to heart level on a table or a desk. . Use the proper sized cuff. It should fit smoothly and snugly around your bare upper arm. There should be enough room to slip a fingertip under the cuff. The bottom edge of the cuff should be 1 inch above the crease of the elbow. . Ideally, take 3 measurements at one sitting and record the average.

## 2016-07-09 NOTE — Progress Notes (Signed)
07/09/2016 [name] [birth year] [id number]   HPI:  [name] is a 64 y.o. female patient of Dr Debara Pickett with a PMH below who presents today for hypertension clinic evaluation.   She saw Dr. Debara Pickett after having some chest discomfort and elevated blood pressures.   She states the BP had been running in the [id number] systolic range for a month or two when she had the discomfort, so she thought it best to follow up with her cardiologist.  The discomfort only occurred the one time and since Dr. Debara Pickett added chlorthalidone to her atenolol, she has noted that her BP is rarely above 130.   She reports no side effects or concerns about her current medications.   Of note, her BMET increased  From 1.05 to 1.17 after addition of chlorthalidone  Blood Pressure Goal:  140/90      Current Medications:  Atenolol chlorthalidone 50/25 mg qam  Cardiac Hx:  Hypertension, hyperlipidemia, atypical chest pain  Family Hx:   Both parents with hypertension, mother passed at 54 after valve replacement surgery (kidney failure) and father a month later from hemorrhagic stroke.  No hypertension noted in siblings or children  Social Hx:  No tobacco, alcohol, only rare caffeine  Diet:  Eats out more than at home, adds salt only to specific foods and not regularly; tries to limit fried foods and white starches;  Likes vegetables; More turkey/chicken than beef  Exercise:  No regular exercise  Home BP readings:  Has home cuff, has not been using.  Checking at chiropractor.  Mostly 120's since starting combination product.  Chiropractor uses wrist cuff.  Intolerances:   No antihypertensives  Wt Readings from Last 3 Encounters:  07/09/16 182 lb (82.6 kg)  06/21/16 183 lb 6.4 oz (83.2 kg)  07/12/15 190 lb (86.2 kg)   BP Readings from Last 3 Encounters:  07/09/16 124/84  06/21/16 (!) 151/88  07/12/15 125/79   Pulse Readings from Last 3 Encounters:  07/09/16 64  06/21/16 63  07/12/15 63     Current Outpatient Prescriptions  Medication Sig Dispense Refill  . atenolol-chlorthalidone (TENORETIC) 50-25 MG tablet Take 1 tablet by mouth daily. 90 tablet 3  . Biotin 1000 MCG tablet Take 1,000 mcg by mouth daily.    . budesonide (PULMICORT) 180 MCG/ACT inhaler Inhale 2 puffs into the lungs 2 (two) times daily as needed (for shortness of breath).     . esomeprazole (NEXIUM) 20 MG capsule Take 20 mg by mouth daily at 12 noon.    . montelukast (SINGULAIR) 10 MG tablet TAKE 1 BY MOUTH DAILY --KATHERINE HEMBERG    . Multiple Vitamin (MULTIVITAMIN) capsule Take 1 capsule by mouth 2 (two) times a week.     . pravastatin (PRAVACHOL) 40 MG tablet   3  . PROAIR HFA 108 (90 BASE) MCG/ACT inhaler Inhale 2 puffs into the lungs as needed.  2  . sertraline (ZOLOFT) 100 MG tablet Take 50-100 mg by mouth every other day.     . Vitamin D, Ergocalciferol, (DRISDOL) 50000 units CAPS capsule Take 50,000 Units by mouth once a week.  12   No current facility-administered medications for this visit.     Allergies  Allergen Reactions  . Bee Venom Swelling  . Crestor [Rosuvastatin] Other (See Comments)    Myalgias  . Erythromycin Itching, Rash and Other (See Comments)  . Latex Itching and Rash    rash  . Rosuvastatin Calcium Other (See Comments)  Myalgias  . Aleve [Naproxen Sodium] Other (See Comments)    unknown  . Naproxen Other (See Comments)    unknown  . Tetracyclines & Related Other (See Comments)    unknown  . Vytorin [Ezetimibe-Simvastatin] Other (See Comments)    unknown    Past Medical History:  Diagnosis Date  . Allergy   . Asthma   . Back pain   . Cancer ([place])    skin cancer  . Depression   . Hyperlipidemia   . Hypertension     Blood pressure 124/84, pulse 64, height 5\' 6"  (1.676 m), weight 182 lb (82.6 kg).  Essential hypertension Today her BP looks well controlled at 124/84.  Will have her repeat BMET in another few days to be sure kidney function does not change.   Otherwise, she can continue to monitor BP a few times each week and let us know should she see any values that concern her.     Tommy Medal PharmD CPP [facility]

## 2017-01-31 ENCOUNTER — Ambulatory Visit (INDEPENDENT_AMBULATORY_CARE_PROVIDER_SITE_OTHER): Payer: BLUE CROSS/BLUE SHIELD

## 2017-01-31 ENCOUNTER — Encounter (INDEPENDENT_AMBULATORY_CARE_PROVIDER_SITE_OTHER): Payer: Self-pay | Admitting: Orthopaedic Surgery

## 2017-01-31 ENCOUNTER — Telehealth (INDEPENDENT_AMBULATORY_CARE_PROVIDER_SITE_OTHER): Payer: Self-pay | Admitting: *Deleted

## 2017-01-31 ENCOUNTER — Ambulatory Visit (INDEPENDENT_AMBULATORY_CARE_PROVIDER_SITE_OTHER): Payer: BLUE CROSS/BLUE SHIELD | Admitting: Orthopaedic Surgery

## 2017-01-31 VITALS — Ht 66.0 in | Wt 182.0 lb

## 2017-01-31 DIAGNOSIS — M5442 Lumbago with sciatica, left side: Secondary | ICD-10-CM

## 2017-01-31 DIAGNOSIS — G8929 Other chronic pain: Secondary | ICD-10-CM

## 2017-01-31 DIAGNOSIS — M542 Cervicalgia: Secondary | ICD-10-CM | POA: Diagnosis not present

## 2017-01-31 NOTE — Telephone Encounter (Signed)
Pt has MRI appt on Thurs May 24th at 6pm at [facility], pt is to arrive 15 mins early to [facility] to pt for appt information.

## 2017-01-31 NOTE — Progress Notes (Signed)
Office Visit Note   Patient: [name]           Date of Birth: 09-Jun-1951           MRN: [id number] Visit Date: 01/31/2017              Requested by: Bridget Hartshorn, NP [address] [place],  [address] PCP: Bridget Hartshorn, NP   Assessment & Plan: Visit Diagnoses:  1. Chronic bilateral low back pain with left-sided sciatica   2. Neck pain     Plan: Patient has failed extensive conservative treatment at this point. Recommend MRI of the cervical and lumbar spine to rule out structural abnormalities. Follow-up after the MRIs.  Follow-Up Instructions: Return in about 2 weeks (around 02/14/2017).   Orders:  Orders Placed This Encounter  Procedures  . XR Lumbar Spine 2-3 Views  . XR Cervical Spine 2 or 3 views  . MR Cervical Spine w/o contrast  . MR Lumbar Spine w/o contrast   No orders of the defined types were placed in this encounter.     Procedures: No procedures performed   Clinical Data: No additional findings.   Subjective: Chief Complaint  Patient presents with  . Lower Back - Pain  . Neck - Pain    Patient is a 66 year old female who has had a year history of neck and back pain with radicular symptoms. She denies any injuries. She is had innumerable chiropractic treatments and adjustments without any significant relief. She denies any constitutional symptoms or focal motor or sensory deficits. She denies any bowel or bladder dysfunction. She endorses burning pain down her arm and leg.    Review of Systems  Constitutional: Negative.   HENT: Negative.   Eyes: Negative.   Respiratory: Negative.   Cardiovascular: Negative.   Endocrine: Negative.   Musculoskeletal: Negative.   Neurological: Negative.   Hematological: Negative.   Psychiatric/Behavioral: Negative.   All other systems reviewed and are negative.    Objective: Vital Signs: Ht 5\' 6"  (1.676 m)   Wt 182 lb (82.6 kg)   BMI 29.38 kg/m   Physical Exam    Constitutional: She is oriented to person, place, and time. She appears well-developed and well-nourished.  HENT:  Head: Normocephalic and atraumatic.  Eyes: EOM are normal.  Neck: Neck supple.  Pulmonary/Chest: Effort normal.  Abdominal: Soft.  Neurological: She is alert and oriented to person, place, and time.  Skin: Skin is warm. Capillary refill takes less than 2 seconds.  Psychiatric: She has a normal mood and affect. Her behavior is normal. Judgment and thought content normal.  Nursing note and vitals reviewed.   Ortho Exam Exam of her neck shows a negative Spurling's and negative Lhermitte's. Nonfocal motor or sensory exam. Reflexes are normal. Negative Hoffmann's. Exam of her back and left lower extremity shows no focal findings. Negative sciatic tension signs. Specialty Comments:  No specialty comments available.  Imaging: Xr Cervical Spine 2 Or 3 Views  Result Date: 01/31/2017 DDD at C5-6, C6-7  Xr Lumbar Spine 2-3 Views  Result Date: 01/31/2017 Lumbar spondylosis and small focal curvature    PMFS History: Patient Active Problem List   Diagnosis Date Noted  . Atypical chest pain 06/21/2016  . Seizures ([place]) 12/14/2014  . Transient memory loss 12/09/2014  . Essential hypertension 12/09/2014  . Mixed hyperlipidemia 12/09/2014   Past Medical History:  Diagnosis Date  . Allergy   . Asthma   . Back pain   . Cancer (  [place])    skin cancer  . Depression   . Hyperlipidemia   . Hypertension     Family History  Problem Relation Age of Onset  . Heart disease Mother   . Kidney disease Mother   . Breast cancer Mother   . Coronary artery disease Mother        CABG at age 38  . Dementia Father   . Stroke Father   . Stroke Maternal Grandmother   . Cancer Maternal Grandfather   . Cancer Paternal Grandmother   . Kidney disease Paternal Grandfather     Past Surgical History:  Procedure Laterality Date  . bladder mesh    . COLONOSCOPY N/A 07/12/2015    Procedure: COLONOSCOPY;  Surgeon: Rogene Houston, MD;  Location: [facility];  Service: Endoscopy;  Laterality: N/A;  1030  . NM MYOCAR PERF WALL MOTION  02/2007   dipyridamole myoview; EF 70%, no wall motion abnormalities, no inducible ischemia, low risk   . OVARIAN CYST REMOVAL  1987  . TONSILECTOMY/ADENOIDECTOMY WITH MYRINGOTOMY  1958  . TONSILLECTOMY    . TRANSTHORACIC ECHOCARDIOGRAM  02/2007   RV mildly dlated; LV normal in size; mild MR with thickened MV leaflets; mild TR; mild pulm valve regurg  . VAGINAL DELIVERY     x3   Social History   Occupational History  . Not on file.   Social History Main Topics  . Smoking status: Never Smoker  . Smokeless tobacco: Never Used  . Alcohol use No  . Drug use: No  . Sexual activity: Not on file

## 2017-02-04 NOTE — Telephone Encounter (Signed)
Pt aware of appt and will call back to schedule follow up for MRI review

## 2017-02-06 ENCOUNTER — Ambulatory Visit (HOSPITAL_COMMUNITY)
Admission: RE | Admit: 2017-02-06 | Discharge: 2017-02-06 | Disposition: A | Discharge status: Home / Self Care | Payer: BLUE CROSS/BLUE SHIELD | Source: Ambulatory Visit | Attending: Orthopaedic Surgery | Admitting: Orthopaedic Surgery

## 2017-02-06 DIAGNOSIS — M5126 Other intervertebral disc displacement, lumbar region: Secondary | ICD-10-CM | POA: Diagnosis not present

## 2017-02-06 DIAGNOSIS — M5442 Lumbago with sciatica, left side: Principal | ICD-10-CM

## 2017-02-06 DIAGNOSIS — Z8673 Personal history of transient ischemic attack (TIA), and cerebral infarction without residual deficits: Secondary | ICD-10-CM | POA: Insufficient documentation

## 2017-02-06 DIAGNOSIS — M50322 Other cervical disc degeneration at C5-C6 level: Secondary | ICD-10-CM | POA: Diagnosis not present

## 2017-02-06 DIAGNOSIS — M4802 Spinal stenosis, cervical region: Secondary | ICD-10-CM | POA: Insufficient documentation

## 2017-02-06 DIAGNOSIS — M2578 Osteophyte, vertebrae: Secondary | ICD-10-CM | POA: Insufficient documentation

## 2017-02-06 DIAGNOSIS — G8929 Other chronic pain: Secondary | ICD-10-CM

## 2017-02-06 DIAGNOSIS — M542 Cervicalgia: Secondary | ICD-10-CM

## 2017-02-06 DIAGNOSIS — M48061 Spinal stenosis, lumbar region without neurogenic claudication: Secondary | ICD-10-CM | POA: Insufficient documentation

## 2017-05-20 DIAGNOSIS — M9905 Segmental and somatic dysfunction of pelvic region: Secondary | ICD-10-CM | POA: Diagnosis not present

## 2017-05-20 DIAGNOSIS — M9904 Segmental and somatic dysfunction of sacral region: Secondary | ICD-10-CM | POA: Diagnosis not present

## 2017-05-20 DIAGNOSIS — M9903 Segmental and somatic dysfunction of lumbar region: Secondary | ICD-10-CM | POA: Diagnosis not present

## 2017-05-20 DIAGNOSIS — M5137 Other intervertebral disc degeneration, lumbosacral region: Secondary | ICD-10-CM | POA: Diagnosis not present

## 2017-07-30 DIAGNOSIS — H25812 Combined forms of age-related cataract, left eye: Secondary | ICD-10-CM | POA: Diagnosis not present

## 2017-07-30 DIAGNOSIS — H25811 Combined forms of age-related cataract, right eye: Secondary | ICD-10-CM | POA: Diagnosis not present

## 2017-07-30 DIAGNOSIS — H2511 Age-related nuclear cataract, right eye: Secondary | ICD-10-CM | POA: Diagnosis not present

## 2017-08-13 DIAGNOSIS — H25812 Combined forms of age-related cataract, left eye: Secondary | ICD-10-CM | POA: Diagnosis not present

## 2017-08-13 DIAGNOSIS — H2512 Age-related nuclear cataract, left eye: Secondary | ICD-10-CM | POA: Diagnosis not present

## 2017-08-26 DIAGNOSIS — N390 Urinary tract infection, site not specified: Secondary | ICD-10-CM | POA: Diagnosis not present

## 2017-08-26 DIAGNOSIS — I10 Essential (primary) hypertension: Secondary | ICD-10-CM | POA: Diagnosis not present

## 2017-08-26 DIAGNOSIS — R300 Dysuria: Secondary | ICD-10-CM | POA: Diagnosis not present

## 2017-08-27 DIAGNOSIS — M9904 Segmental and somatic dysfunction of sacral region: Secondary | ICD-10-CM | POA: Diagnosis not present

## 2017-08-27 DIAGNOSIS — M9902 Segmental and somatic dysfunction of thoracic region: Secondary | ICD-10-CM | POA: Diagnosis not present

## 2017-08-27 DIAGNOSIS — M9901 Segmental and somatic dysfunction of cervical region: Secondary | ICD-10-CM | POA: Diagnosis not present

## 2017-08-27 DIAGNOSIS — M62830 Muscle spasm of back: Secondary | ICD-10-CM | POA: Diagnosis not present

## 2017-09-01 ENCOUNTER — Other Ambulatory Visit: Payer: Self-pay | Admitting: Internal Medicine

## 2017-09-01 NOTE — Telephone Encounter (Signed)
REFILL 

## 2017-09-03 DIAGNOSIS — M9902 Segmental and somatic dysfunction of thoracic region: Secondary | ICD-10-CM | POA: Diagnosis not present

## 2017-09-03 DIAGNOSIS — M9901 Segmental and somatic dysfunction of cervical region: Secondary | ICD-10-CM | POA: Diagnosis not present

## 2017-09-03 DIAGNOSIS — M9904 Segmental and somatic dysfunction of sacral region: Secondary | ICD-10-CM | POA: Diagnosis not present

## 2017-09-03 DIAGNOSIS — M62830 Muscle spasm of back: Secondary | ICD-10-CM | POA: Diagnosis not present

## 2017-09-17 DIAGNOSIS — M62830 Muscle spasm of back: Secondary | ICD-10-CM | POA: Diagnosis not present

## 2017-09-17 DIAGNOSIS — M9901 Segmental and somatic dysfunction of cervical region: Secondary | ICD-10-CM | POA: Diagnosis not present

## 2017-09-17 DIAGNOSIS — M9904 Segmental and somatic dysfunction of sacral region: Secondary | ICD-10-CM | POA: Diagnosis not present

## 2017-09-17 DIAGNOSIS — M9902 Segmental and somatic dysfunction of thoracic region: Secondary | ICD-10-CM | POA: Diagnosis not present

## 2017-09-26 ENCOUNTER — Telehealth: Payer: Self-pay | Admitting: Internal Medicine

## 2017-09-26 MED ORDER — ATENOLOL-CHLORTHALIDONE 50-25 MG PO TABS: 1.0000 | ORAL_TABLET | Freq: Every day | ORAL | 0 refills | Status: DC

## 2017-09-26 NOTE — Telephone Encounter (Signed)
New message       *STAT* If patient is at the pharmacy, call can be transferred to refill team.   1. Which medications need to be refilled? (please list name of each medication and dose if known)  atenolol-chlorthalidone (TENORETIC) 50-25 MG tablet Take 1 tablet by mouth daily. NEED OV.     2. Which pharmacy/location (including street and city if local pharmacy) is medication to be sent to? [facility]   3. Do they need a 30 day or 90 day supply?  [place]

## 2017-09-26 NOTE — Addendum Note (Signed)
Addended by: Leanord Asal T on: 09/26/2017 05:02 PM   Modules accepted: Orders

## 2017-09-29 DIAGNOSIS — M62830 Muscle spasm of back: Secondary | ICD-10-CM | POA: Diagnosis not present

## 2017-09-29 DIAGNOSIS — M9904 Segmental and somatic dysfunction of sacral region: Secondary | ICD-10-CM | POA: Diagnosis not present

## 2017-09-29 DIAGNOSIS — M9902 Segmental and somatic dysfunction of thoracic region: Secondary | ICD-10-CM | POA: Diagnosis not present

## 2017-09-29 DIAGNOSIS — M9901 Segmental and somatic dysfunction of cervical region: Secondary | ICD-10-CM | POA: Diagnosis not present

## 2017-10-01 DIAGNOSIS — M62830 Muscle spasm of back: Secondary | ICD-10-CM | POA: Diagnosis not present

## 2017-10-01 DIAGNOSIS — M9902 Segmental and somatic dysfunction of thoracic region: Secondary | ICD-10-CM | POA: Diagnosis not present

## 2017-10-01 DIAGNOSIS — M9904 Segmental and somatic dysfunction of sacral region: Secondary | ICD-10-CM | POA: Diagnosis not present

## 2017-10-01 DIAGNOSIS — M9901 Segmental and somatic dysfunction of cervical region: Secondary | ICD-10-CM | POA: Diagnosis not present

## 2017-10-02 ENCOUNTER — Other Ambulatory Visit: Payer: Self-pay | Admitting: *Deleted

## 2017-10-02 MED ORDER — ATENOLOL-CHLORTHALIDONE 50-25 MG PO TABS: 1.0000 | ORAL_TABLET | Freq: Every day | ORAL | 0 refills | Status: DC

## 2017-10-02 NOTE — Telephone Encounter (Signed)
Per insurance patient has to get her refills from [place] and she no longer uses [facility] in [place].  I have changed her pharmacy to  [facility] on [address].

## 2017-10-09 ENCOUNTER — Telehealth: Payer: Self-pay | Admitting: Internal Medicine

## 2017-10-09 DIAGNOSIS — R6889 Other general symptoms and signs: Secondary | ICD-10-CM | POA: Diagnosis not present

## 2017-10-09 DIAGNOSIS — I10 Essential (primary) hypertension: Secondary | ICD-10-CM | POA: Diagnosis not present

## 2017-10-09 DIAGNOSIS — J111 Influenza due to unidentified influenza virus with other respiratory manifestations: Secondary | ICD-10-CM | POA: Diagnosis not present

## 2017-10-09 MED ORDER — ATENOLOL-CHLORTHALIDONE 50-25 MG PO TABS: 1.0000 | ORAL_TABLET | Freq: Every day | ORAL | 0 refills | Status: DC

## 2017-10-09 NOTE — Telephone Encounter (Signed)
  °*  STAT* If patient is at the pharmacy, call can be transferred to refill team.   1. Which medications need to be refilled? (please list name of each medication and dose if known)  atenolol-chlorthalidone (TENORETIC) 50-25 MG tablet Take 1 tablet by mouth daily. NEED OV.     2. Which pharmacy/location (including street and city if local pharmacy) is medication to be sent to? [facility], [place], [place] [address]  [phone]   3. Do they need a 30 day or 90 day supply?  [place]

## 2017-11-12 DIAGNOSIS — L570 Actinic keratosis: Secondary | ICD-10-CM | POA: Diagnosis not present

## 2017-11-12 DIAGNOSIS — Z85828 Personal history of other malignant neoplasm of skin: Secondary | ICD-10-CM | POA: Diagnosis not present

## 2017-11-12 DIAGNOSIS — M71349 Other bursal cyst, unspecified hand: Secondary | ICD-10-CM | POA: Diagnosis not present

## 2017-11-12 DIAGNOSIS — L259 Unspecified contact dermatitis, unspecified cause: Secondary | ICD-10-CM | POA: Diagnosis not present

## 2017-11-20 DIAGNOSIS — M71342 Other bursal cyst, left hand: Secondary | ICD-10-CM | POA: Diagnosis not present

## 2017-11-21 ENCOUNTER — Ambulatory Visit: Payer: BLUE CROSS/BLUE SHIELD | Admitting: Internal Medicine

## 2017-11-21 ENCOUNTER — Encounter: Payer: Self-pay | Admitting: Internal Medicine

## 2017-11-21 VITALS — BP 132/86 | HR 61 | Ht 66.0 in | Wt 184.0 lb

## 2017-11-21 DIAGNOSIS — E782 Mixed hyperlipidemia: Secondary | ICD-10-CM | POA: Diagnosis not present

## 2017-11-21 DIAGNOSIS — R5383 Other fatigue: Secondary | ICD-10-CM | POA: Diagnosis not present

## 2017-11-21 DIAGNOSIS — I10 Essential (primary) hypertension: Secondary | ICD-10-CM | POA: Diagnosis not present

## 2017-11-21 DIAGNOSIS — Z8249 Family history of ischemic heart disease and other diseases of the circulatory system: Secondary | ICD-10-CM | POA: Diagnosis not present

## 2017-11-21 LAB — LIPID PANEL
CHOLESTEROL TOTAL: 252 mg/dL — AB (ref 100–199)
Chol/HDL Ratio: 6.3 ratio — ABNORMAL HIGH (ref 0.0–4.4)
HDL: 40 mg/dL (ref >39)
LDL CALC: 169 mg/dL — AB (ref 0–99)
TRIGLYCERIDES: 217 mg/dL — AB (ref 0–149)
VLDL Cholesterol Cal: 43 mg/dL — ABNORMAL HIGH (ref 5–40)

## 2017-11-21 LAB — VITAMIN B12: Vitamin B-12: 485 pg/mL (ref 232–1245)

## 2017-11-21 MED ORDER — PRAVASTATIN SODIUM 40 MG PO TABS: 40.0000 mg | ORAL_TABLET | Freq: Every day | ORAL | 5 refills | Status: DC

## 2017-11-21 MED ORDER — ATENOLOL-CHLORTHALIDONE 50-25 MG PO TABS: 1.0000 | ORAL_TABLET | Freq: Every day | ORAL | 3 refills | Status: DC

## 2017-11-21 NOTE — Patient Instructions (Addendum)
Medication Instructions:   Resume pravastatin 40mg  daily  Labwork:  LIPID PANEL to assess cholesterol + Vitamin B12 level   Testing/Procedures:  Dr. Debara Pickett has ordered a CT coronary calcium score. This test is done at [address].  Coronary CalciumScan A coronary calcium scan is an imaging test used to look for deposits of calcium and other fatty materials (plaques) in the inner lining of the blood vessels of the heart (coronary arteries). These deposits of calcium and plaques can partly clog and narrow the coronary arteries without producing any symptoms or warning signs. This puts a person at risk for a heart attack. This test can detect these deposits before symptoms develop. Tell a health care provider about:  Any allergies you have.  All medicines you are taking, including vitamins, herbs, eye drops, creams, and over-the-counter medicines.  Any problems you or family members have had with anesthetic medicines.  Any blood disorders you have.  Any surgeries you have had.  Any medical conditions you have.  Whether you are pregnant or may be pregnant. What are the risks? Generally, this is a safe procedure. However, problems may occur, including:  Harm to a pregnant woman and her unborn baby. This test involves the use of radiation. Radiation exposure can be dangerous to a pregnant woman and her unborn baby. If you are pregnant, you generally should not have this procedure done.  Slight increase in the risk of cancer. This is because of the radiation involved in the test. What happens before the procedure? No preparation is needed for this procedure. What happens during the procedure?  You will undress and remove any jewelry around your neck or chest.  You will put on a hospital gown.  Sticky electrodes will be placed on your chest. The electrodes will be connected to an electrocardiogram (ECG) machine to record a tracing of the electrical activity of  your heart.  A CT scanner will take pictures of your heart. During this time, you will be asked to lie still and hold your breath for 2-3 seconds while a picture of your heart is being taken. The procedure may vary among health care providers and hospitals. What happens after the procedure?  You can get dressed.  You can return to your normal activities.  It is up to you to get the results of your test. Ask your health care provider, or the department that is doing the test, when your results will be ready. Summary  A coronary calcium scan is an imaging test used to look for deposits of calcium and other fatty materials (plaques) in the inner lining of the blood vessels of the heart (coronary arteries).  Generally, this is a safe procedure. Tell your health care provider if you are pregnant or may be pregnant.  No preparation is needed for this procedure.  A CT scanner will take pictures of your heart.  You can return to your normal activities after the scan is done. This information is not intended to replace advice given to you by your health care provider. Make sure you discuss any questions you have with your health care provider. Document Released: 02/29/2008 Document Revised: 07/22/2016 Document Reviewed: 07/22/2016 Elsevier Interactive Patient Education  2017 [place]:  Your physician wants you to follow-up with Dr. Debara Pickett in about 1 month - after your test.   If you need a refill on your cardiac medications before your next appointment, please call your pharmacy.  Any Other  Special Instructions Will Be Listed Below (If Applicable).

## 2017-11-21 NOTE — Progress Notes (Signed)
OFFICE NOTE  Chief Complaint:  No complaints  Primary Care Physician: Tanya Hartshorn, NP  HPI:  [name] is a 67 y.o. female Who I previously seen in 2011 for palpitations and dyslipidemia. She is a former patient of Dr. Janene Simmons. Her mother [name] was a patient of mine who died recently. I have not seen her since 2011 although she had an appointment scheduled in 2015 which was canceled. Recently she's noted that she's had some elevated blood pressures. She was preparing for a beach trip and had some chest discomfort. This lasted for 2 or 3 hours and was described as a vertical line down the center of her chest. It was more of a dull pressure and resolved spontaneously. She's not had any exertional symptoms however is limited by a pinched nerve in her low back. She did have a cane for which she used to walk today. She does not exercise regularly. She also mentioned an episode where she had about 8 hours of difficulty recalling names and some memory loss after a traumatic event shortly after her mother died. She had an extensive workup for this including an MRI of the brain and ultimately no clear cause was found. She reports very good control of her her palpitations.  11/21/2017  [name] returns today for follow-up.  Overall she is doing well.  She denies any chest pain or worsening shortness of breath.  Previously I had added chlorthalidone to her atenolol.  She is doing well with that and reports blood pressures in the 017B and 939 systolic at home.  Recently she had developed a cyst on her left middle finger.  She had surgery for that this week.  She has reported some fatigue and wonders whether or not that may be related to a B12 deficiency.  There are apparently some reports that B12 deficiency can lead to cysts such as when she had on her hand.  Additionally, we discussed her cholesterol today.  In the past she has been intolerant to rosuvastatin and Vytorin.  She was  placed on pravastatin by me for elevated LDL cholesterol close to 190.  She had a marked reduction in total cholesterol on it however may have had side effects.  She discontinued it over a year ago and is not restarted.  She has no known atherosclerotic cardiovascular disease.  In 2016 she had carotid Dopplers which were negative in the setting of a TIA.  The etiology of the TIA is not clear.  PMHx:  Past Medical History:  Diagnosis Date  . Allergy   . Asthma   . Back pain   . Cancer ([name])    skin cancer  . Depression   . Hyperlipidemia   . Hypertension     Past Surgical History:  Procedure Laterality Date  . bladder mesh    . COLONOSCOPY N/A 07/12/2015   Procedure: COLONOSCOPY;  Surgeon: Rogene Houston, MD;  [facility];  Service: Endoscopy;  Laterality: N/A;  1030  . NM MYOCAR PERF WALL MOTION  02/2007   dipyridamole myoview; EF 70%, no wall motion abnormalities, no inducible ischemia, low risk   . OVARIAN CYST REMOVAL  1987  . TONSILECTOMY/ADENOIDECTOMY WITH MYRINGOTOMY  1958  . TONSILLECTOMY    . TRANSTHORACIC ECHOCARDIOGRAM  02/2007   RV mildly dlated; LV normal in size; mild MR with thickened MV leaflets; mild TR; mild pulm valve regurg  . VAGINAL DELIVERY     x3  FAMHx:  Family History  Problem Relation Age of Onset  . Heart disease Mother   . Kidney disease Mother   . Breast cancer Mother   . Coronary artery disease Mother        CABG at age 73  . Dementia Father   . Stroke Father   . Stroke Maternal Grandmother   . Cancer Maternal Grandfather   . Cancer Paternal Grandmother   . Kidney disease Paternal Grandfather     SOCHx:   reports that  has never smoked. she has never used smokeless tobacco. She reports that she does not drink alcohol or use drugs.  ALLERGIES:  Allergies  Allergen Reactions  . Bee Venom Swelling  . Crestor [Rosuvastatin] Other (See Comments)    Myalgias  . Erythromycin Itching, Rash and Other (See Comments)  . Latex  Itching and Rash    rash  . Rosuvastatin Calcium Other (See Comments)    Myalgias  . Aleve [Naproxen Sodium] Other (See Comments)    unknown  . Naproxen Other (See Comments)    unknown  . Tetracyclines & Related Other (See Comments)    unknown  . Vytorin [Ezetimibe-Simvastatin] Other (See Comments)    unknown    ROS: Pertinent items noted in HPI and remainder of comprehensive ROS otherwise negative.  HOME MEDS: Current Outpatient Medications on File Prior to Visit  Medication Sig Dispense Refill  . atenolol-chlorthalidone (TENORETIC) 50-25 MG tablet Take 1 tablet by mouth daily. KEEP OV. 90 tablet 0  . Biotin 1000 MCG tablet Take 1,000 mcg by mouth daily.    . budesonide (PULMICORT) 180 MCG/ACT inhaler Inhale 2 puffs into the lungs 2 (two) times daily as needed (for shortness of breath).     . esomeprazole (NEXIUM) 20 MG capsule Take 20 mg by mouth daily at 12 noon.    . montelukast (SINGULAIR) 10 MG tablet TAKE 1 BY MOUTH DAILY --KATHERINE HEMBERG    . Multiple Vitamin (MULTIVITAMIN) capsule Take 1 capsule by mouth 2 (two) times a week.     Marland Kitchen PROAIR HFA 108 (90 BASE) MCG/ACT inhaler Inhale 2 puffs into the lungs as needed.  2  . Vitamin D, Ergocalciferol, (DRISDOL) 50000 units CAPS capsule Take 50,000 Units by mouth once a week.  12  . pravastatin (PRAVACHOL) 40 MG tablet   3  . sertraline (ZOLOFT) 100 MG tablet Take 50-100 mg by mouth every other day.      No current facility-administered medications on file prior to visit.     LABS/IMAGING: No results found for this or any previous visit (from the past 48 hour(s)). No results found.  WEIGHTS: Wt Readings from Last 3 Encounters:  11/21/17 184 lb (83.5 kg)  01/31/17 182 lb (82.6 kg)  07/09/16 182 lb (82.6 kg)    VITALS: BP 132/86   Pulse 61   Ht 5\' 6"  (1.676 m)   Wt 184 lb (83.5 kg)   BMI 29.70 kg/m   EXAM: General appearance: alert and no distress Neck: no carotid bruit and no JVD Lungs: clear to auscultation  bilaterally Heart: regular rate and rhythm Abdomen: soft, non-tender; bowel sounds normal; no masses,  no organomegaly Extremities: extremities normal, atraumatic, no cyanosis or edema and Left middle finger bandaged Pulses: 2+ and symmetric Skin: Skin color, texture, turgor normal. No rashes or lesions Neurologic: Grossly normal Psych: Mildly anxious  EKG: Sinus rhythm at 61-personally reviewed  ASSESSMENT: 1. Dyslipidemia 2. Hypertension - uncontrolled 3. Palpitations 4. Family history of cardiovascular disease  PLAN: 1.   Mrs. Furlough has a history of marked dyslipidemia and intolerance to rosuvastatin and Vytorin.  She was taking pravastatin with an improvement in her cholesterol numbers, but discontinued it for unknown reasons.  Is not clear whether she had side effects.  I like to repeat a lipid profile and I expect her LDL to be elevated.  I would advise that she go back on the pravastatin 40 mg and then we will reassess her cholesterol in a few months.  If it turns out that she cannot tolerate pravastatin, she may be a candidate for PCSK9 inhibitor.  She has no known cardiovascular disease, and given her family history, I think we should consider surveillance for that.  We discussed a coronary artery calcium score today and she is in favor of that.  We will plan to see her back to discuss that study and see if she is tolerating the pravastatin in about 4-6 weeks.  Pixie Casino, MD, [facility], [place] Director of the Advanced Lipid Disorders &  Cardiovascular Risk Reduction Clinic Diplomate of the American Board of Clinical Lipidology Attending Cardiologist  Direct Dial: [phone]  Fax: [phone]  Website:  [email or web address] 11/21/2017, 9:32 AM

## 2017-11-25 ENCOUNTER — Other Ambulatory Visit: Payer: Self-pay | Admitting: *Deleted

## 2017-11-25 ENCOUNTER — Encounter: Payer: Self-pay | Admitting: Internal Medicine

## 2017-11-25 DIAGNOSIS — E782 Mixed hyperlipidemia: Secondary | ICD-10-CM

## 2017-12-02 ENCOUNTER — Ambulatory Visit (INDEPENDENT_AMBULATORY_CARE_PROVIDER_SITE_OTHER)
Admission: RE | Admit: 2017-12-02 | Discharge: 2017-12-02 | Disposition: A | Discharge status: Home / Self Care | Payer: Self-pay | Source: Ambulatory Visit | Attending: Internal Medicine | Admitting: Internal Medicine

## 2017-12-02 DIAGNOSIS — Z8249 Family history of ischemic heart disease and other diseases of the circulatory system: Secondary | ICD-10-CM

## 2017-12-02 DIAGNOSIS — E782 Mixed hyperlipidemia: Secondary | ICD-10-CM

## 2017-12-05 ENCOUNTER — Other Ambulatory Visit: Payer: Self-pay | Admitting: *Deleted

## 2017-12-05 DIAGNOSIS — R918 Other nonspecific abnormal finding of lung field: Secondary | ICD-10-CM

## 2017-12-11 DIAGNOSIS — M9902 Segmental and somatic dysfunction of thoracic region: Secondary | ICD-10-CM | POA: Diagnosis not present

## 2017-12-11 DIAGNOSIS — M9901 Segmental and somatic dysfunction of cervical region: Secondary | ICD-10-CM | POA: Diagnosis not present

## 2017-12-11 DIAGNOSIS — M62830 Muscle spasm of back: Secondary | ICD-10-CM | POA: Diagnosis not present

## 2017-12-11 DIAGNOSIS — M9904 Segmental and somatic dysfunction of sacral region: Secondary | ICD-10-CM | POA: Diagnosis not present

## 2018-01-07 ENCOUNTER — Ambulatory Visit: Payer: BLUE CROSS/BLUE SHIELD | Admitting: Internal Medicine

## 2018-01-08 DIAGNOSIS — M62830 Muscle spasm of back: Secondary | ICD-10-CM | POA: Diagnosis not present

## 2018-01-08 DIAGNOSIS — M9901 Segmental and somatic dysfunction of cervical region: Secondary | ICD-10-CM | POA: Diagnosis not present

## 2018-01-08 DIAGNOSIS — M9902 Segmental and somatic dysfunction of thoracic region: Secondary | ICD-10-CM | POA: Diagnosis not present

## 2018-01-08 DIAGNOSIS — M9904 Segmental and somatic dysfunction of sacral region: Secondary | ICD-10-CM | POA: Diagnosis not present

## 2018-01-15 DIAGNOSIS — M9901 Segmental and somatic dysfunction of cervical region: Secondary | ICD-10-CM | POA: Diagnosis not present

## 2018-01-15 DIAGNOSIS — M9902 Segmental and somatic dysfunction of thoracic region: Secondary | ICD-10-CM | POA: Diagnosis not present

## 2018-01-15 DIAGNOSIS — M62830 Muscle spasm of back: Secondary | ICD-10-CM | POA: Diagnosis not present

## 2018-01-15 DIAGNOSIS — M9904 Segmental and somatic dysfunction of sacral region: Secondary | ICD-10-CM | POA: Diagnosis not present

## 2018-01-21 DIAGNOSIS — M9902 Segmental and somatic dysfunction of thoracic region: Secondary | ICD-10-CM | POA: Diagnosis not present

## 2018-01-21 DIAGNOSIS — M9904 Segmental and somatic dysfunction of sacral region: Secondary | ICD-10-CM | POA: Diagnosis not present

## 2018-01-21 DIAGNOSIS — M9901 Segmental and somatic dysfunction of cervical region: Secondary | ICD-10-CM | POA: Diagnosis not present

## 2018-01-21 DIAGNOSIS — M62830 Muscle spasm of back: Secondary | ICD-10-CM | POA: Diagnosis not present

## 2018-01-22 DIAGNOSIS — M9904 Segmental and somatic dysfunction of sacral region: Secondary | ICD-10-CM | POA: Diagnosis not present

## 2018-01-22 DIAGNOSIS — M62830 Muscle spasm of back: Secondary | ICD-10-CM | POA: Diagnosis not present

## 2018-01-22 DIAGNOSIS — M9901 Segmental and somatic dysfunction of cervical region: Secondary | ICD-10-CM | POA: Diagnosis not present

## 2018-01-22 DIAGNOSIS — M9902 Segmental and somatic dysfunction of thoracic region: Secondary | ICD-10-CM | POA: Diagnosis not present

## 2018-01-26 DIAGNOSIS — R102 Pelvic and perineal pain: Secondary | ICD-10-CM | POA: Diagnosis not present

## 2018-01-26 DIAGNOSIS — R300 Dysuria: Secondary | ICD-10-CM | POA: Diagnosis not present

## 2018-01-27 DIAGNOSIS — M62830 Muscle spasm of back: Secondary | ICD-10-CM | POA: Diagnosis not present

## 2018-01-27 DIAGNOSIS — M9902 Segmental and somatic dysfunction of thoracic region: Secondary | ICD-10-CM | POA: Diagnosis not present

## 2018-01-27 DIAGNOSIS — M9901 Segmental and somatic dysfunction of cervical region: Secondary | ICD-10-CM | POA: Diagnosis not present

## 2018-01-27 DIAGNOSIS — M9904 Segmental and somatic dysfunction of sacral region: Secondary | ICD-10-CM | POA: Diagnosis not present

## 2018-01-28 DIAGNOSIS — R102 Pelvic and perineal pain: Secondary | ICD-10-CM | POA: Diagnosis not present

## 2018-02-04 DIAGNOSIS — M9902 Segmental and somatic dysfunction of thoracic region: Secondary | ICD-10-CM | POA: Diagnosis not present

## 2018-02-04 DIAGNOSIS — M9901 Segmental and somatic dysfunction of cervical region: Secondary | ICD-10-CM | POA: Diagnosis not present

## 2018-02-04 DIAGNOSIS — M62830 Muscle spasm of back: Secondary | ICD-10-CM | POA: Diagnosis not present

## 2018-02-04 DIAGNOSIS — M9904 Segmental and somatic dysfunction of sacral region: Secondary | ICD-10-CM | POA: Diagnosis not present

## 2018-02-12 ENCOUNTER — Other Ambulatory Visit: Payer: Self-pay | Admitting: Gastroenterology

## 2018-02-12 DIAGNOSIS — Z9889 Other specified postprocedural states: Secondary | ICD-10-CM

## 2018-02-12 DIAGNOSIS — R1030 Lower abdominal pain, unspecified: Secondary | ICD-10-CM

## 2018-02-12 DIAGNOSIS — R195 Other fecal abnormalities: Secondary | ICD-10-CM | POA: Diagnosis not present

## 2018-02-12 DIAGNOSIS — Z86010 Personal history of colonic polyps: Secondary | ICD-10-CM | POA: Diagnosis not present

## 2018-02-18 DIAGNOSIS — E782 Mixed hyperlipidemia: Secondary | ICD-10-CM | POA: Diagnosis not present

## 2018-02-20 ENCOUNTER — Telehealth: Payer: Self-pay | Admitting: Internal Medicine

## 2018-02-20 NOTE — Telephone Encounter (Signed)
Received faxed labs from [facility]. Per Dr. Lysbeth Penner review, labs "improved on pravastatin 40 - can she add zetia 10mg  daily & repeat lipids in 3 months"  LM for patient to call back to discuss  Lipid panel done 02/18/18 Total chol: 198 Trig: 178 HDL: 48 VLDL: 36 LDL: 114

## 2018-02-23 ENCOUNTER — Ambulatory Visit
Admission: RE | Admit: 2018-02-23 | Discharge: 2018-02-23 | Disposition: A | Discharge status: Home / Self Care | Payer: BLUE CROSS/BLUE SHIELD | Source: Ambulatory Visit | Attending: Gastroenterology | Admitting: Gastroenterology

## 2018-02-23 DIAGNOSIS — Z9889 Other specified postprocedural states: Secondary | ICD-10-CM

## 2018-02-23 DIAGNOSIS — R1030 Lower abdominal pain, unspecified: Secondary | ICD-10-CM

## 2018-02-23 DIAGNOSIS — D259 Leiomyoma of uterus, unspecified: Secondary | ICD-10-CM | POA: Diagnosis not present

## 2018-02-23 DIAGNOSIS — R195 Other fecal abnormalities: Secondary | ICD-10-CM

## 2018-02-23 MED ORDER — IOHEXOL 300 MG/ML  SOLN
100.0000 mL | Freq: Once | INTRAMUSCULAR | Status: AC | PRN
  Administered 2018-02-23: 100 mL via INTRAVENOUS

## 2018-02-24 DIAGNOSIS — Z8049 Family history of malignant neoplasm of other genital organs: Secondary | ICD-10-CM | POA: Diagnosis not present

## 2018-02-24 DIAGNOSIS — Z803 Family history of malignant neoplasm of breast: Secondary | ICD-10-CM | POA: Diagnosis not present

## 2018-02-24 DIAGNOSIS — Z8042 Family history of malignant neoplasm of prostate: Secondary | ICD-10-CM | POA: Diagnosis not present

## 2018-02-24 DIAGNOSIS — Z6829 Body mass index (BMI) 29.0-29.9, adult: Secondary | ICD-10-CM | POA: Diagnosis not present

## 2018-02-24 DIAGNOSIS — Z806 Family history of leukemia: Secondary | ICD-10-CM | POA: Diagnosis not present

## 2018-02-24 DIAGNOSIS — Z01419 Encounter for gynecological examination (general) (routine) without abnormal findings: Secondary | ICD-10-CM | POA: Diagnosis not present

## 2018-02-24 DIAGNOSIS — Z1231 Encounter for screening mammogram for malignant neoplasm of breast: Secondary | ICD-10-CM | POA: Diagnosis not present

## 2018-02-24 DIAGNOSIS — Z13820 Encounter for screening for osteoporosis: Secondary | ICD-10-CM | POA: Diagnosis not present

## 2018-03-03 NOTE — Telephone Encounter (Signed)
LMTCB to discuss labs.

## 2018-03-16 ENCOUNTER — Encounter: Payer: Self-pay | Admitting: Internal Medicine

## 2018-03-16 ENCOUNTER — Ambulatory Visit: Payer: BLUE CROSS/BLUE SHIELD | Admitting: Internal Medicine

## 2018-03-16 VITALS — BP 120/84 | HR 59 | Ht 66.0 in | Wt 184.0 lb

## 2018-03-16 DIAGNOSIS — Z8249 Family history of ischemic heart disease and other diseases of the circulatory system: Secondary | ICD-10-CM

## 2018-03-16 DIAGNOSIS — I10 Essential (primary) hypertension: Secondary | ICD-10-CM | POA: Diagnosis not present

## 2018-03-16 DIAGNOSIS — E782 Mixed hyperlipidemia: Secondary | ICD-10-CM

## 2018-03-16 NOTE — Progress Notes (Signed)
OFFICE NOTE  Chief Complaint:  Follow-up CT scan and lipid profile  Primary Care Physician: Bridget Hartshorn, NP  HPI:  [name] is a 67 y.o. female Who I previously seen in 2011 for palpitations and dyslipidemia. She is a former patient of Dr. Janene Madeira. Her mother [name] was a patient of mine who died recently. I have not seen her since 2011 although she had an appointment scheduled in 2015 which was canceled. Recently she's noted that she's had some elevated blood pressures. She was preparing for a beach trip and had some chest discomfort. This lasted for 2 or 3 hours and was described as a vertical line down the center of her chest. It was more of a dull pressure and resolved spontaneously. She's not had any exertional symptoms however is limited by a pinched nerve in her low back. She did have a cane for which she used to walk today. She does not exercise regularly. She also mentioned an episode where she had about 8 hours of difficulty recalling names and some memory loss after a traumatic event shortly after her mother died. She had an extensive workup for this including an MRI of the brain and ultimately no clear cause was found. She reports very good control of her her palpitations.  11/21/2017  [name] returns today for follow-up.  Overall she is doing well.  She denies any chest pain or worsening shortness of breath.  Previously I had added chlorthalidone to her atenolol.  She is doing well with that and reports blood pressures in the 993Z and 169 systolic at home.  Recently she had developed a cyst on her left middle finger.  She had surgery for that this week.  She has reported some fatigue and wonders whether or not that may be related to a B12 deficiency.  There are apparently some reports that B12 deficiency can lead to cysts such as when she had on her hand.  Additionally, we discussed her cholesterol today.  In the past she has been intolerant to rosuvastatin  and Vytorin.  She was placed on pravastatin by me for elevated LDL cholesterol close to 190.  She had a marked reduction in total cholesterol on it however may have had side effects.  She discontinued it over a year ago and is not restarted.  She has no known atherosclerotic cardiovascular disease.  In 2016 she had carotid Dopplers which were negative in the setting of a TIA.  The etiology of the TIA is not clear.  03/16/2018  [name] returns today for follow-up.  She underwent coronary artery calcium scoring which was reported as 0, however she was found to have a small subcentimeter nodule which will need follow-up with a repeat CT scan in a year.  Overall she is doing well.  She was confused and got her lipid profile a month earlier than her appointment.  At lipid profile showed total cholesterol 198, triglycerides 178, HDL 48 and LDL 114.  She therefore has had improvement on pravastatin.  We discussed whether or not she actually needs to be on statin therapy, interestingly since she has a 0 calcium score.  Her ten-year risk is actually quite low however one considers her 30-year risk which is reasonable given her age in the mid 47s, would argue for statin therapy provided her diet is optimized.  She seems to be tolerating pravastatin without side effects.  PMHx:  Past Medical History:  Diagnosis Date  .  Allergy   . Asthma   . Back pain   . Cancer ([place])    skin cancer  . Depression   . Hyperlipidemia   . Hypertension     Past Surgical History:  Procedure Laterality Date  . bladder mesh    . COLONOSCOPY N/A 07/12/2015   Procedure: COLONOSCOPY;  Surgeon: Rogene Houston, MD;  [facility];  Service: Endoscopy;  Laterality: N/A;  1030  . NM MYOCAR PERF WALL MOTION  02/2007   dipyridamole myoview; EF 70%, no wall motion abnormalities, no inducible ischemia, low risk   . OVARIAN CYST REMOVAL  1987  . TONSILECTOMY/ADENOIDECTOMY WITH MYRINGOTOMY  1958  . TONSILLECTOMY    .  TRANSTHORACIC ECHOCARDIOGRAM  02/2007   RV mildly dlated; LV normal in size; mild MR with thickened MV leaflets; mild TR; mild pulm valve regurg  . VAGINAL DELIVERY     x3    FAMHx:  Family History  Problem Relation Age of Onset  . Heart disease Mother   . Kidney disease Mother   . Breast cancer Mother   . Coronary artery disease Mother        CABG at age 68  . Dementia Father   . Stroke Father   . Stroke Maternal Grandmother   . Cancer Maternal Grandfather   . Cancer Paternal Grandmother   . Kidney disease Paternal Grandfather     SOCHx:   reports that she has never smoked. She has never used smokeless tobacco. She reports that she does not drink alcohol or use drugs.  ALLERGIES:  Allergies  Allergen Reactions  . Bee Venom Swelling  . Crestor [Rosuvastatin] Other (See Comments)    Myalgias  . Erythromycin Itching, Rash and Other (See Comments)  . Latex Itching and Rash    rash  . Rosuvastatin Calcium Other (See Comments)    Myalgias  . Aleve [Naproxen Sodium] Other (See Comments)    unknown  . Naproxen Other (See Comments)    unknown  . Tetracyclines & Related Other (See Comments)    unknown  . Vytorin [Ezetimibe-Simvastatin] Other (See Comments)    unknown    ROS: Pertinent items noted in HPI and remainder of comprehensive ROS otherwise negative.  HOME MEDS: Current Outpatient Medications on File Prior to Visit  Medication Sig Dispense Refill  . atenolol-chlorthalidone (TENORETIC) 50-25 MG tablet Take 1 tablet by mouth daily. 90 tablet 3  . Biotin 1000 MCG tablet Take 1,000 mcg by mouth daily.    . budesonide (PULMICORT) 180 MCG/ACT inhaler Inhale 2 puffs into the lungs 2 (two) times daily as needed (for shortness of breath).     . esomeprazole (NEXIUM) 20 MG capsule Take 20 mg by mouth daily at 12 noon.    . montelukast (SINGULAIR) 10 MG tablet TAKE 1 BY MOUTH DAILY --KATHERINE HEMBERG    . Multiple Vitamin (MULTIVITAMIN) capsule Take 1 capsule by mouth 2  (two) times a week.     . pravastatin (PRAVACHOL) 40 MG tablet Take 1 tablet (40 mg total) by mouth daily. 30 tablet 5  . PROAIR HFA 108 (90 BASE) MCG/ACT inhaler Inhale 2 puffs into the lungs as needed.  2  . Vitamin D, Ergocalciferol, (DRISDOL) 50000 units CAPS capsule Take 50,000 Units by mouth once a week.  12  . sertraline (ZOLOFT) 100 MG tablet Take 50-100 mg by mouth every other day.      No current facility-administered medications on file prior to visit.  LABS/IMAGING: No results found for this or any previous visit (from the past 48 hour(s)). No results found.  WEIGHTS: Wt Readings from Last 3 Encounters:  03/16/18 184 lb (83.5 kg)  11/21/17 184 lb (83.5 kg)  01/31/17 182 lb (82.6 kg)    VITALS: BP 120/84 (BP Location: Left Arm, Patient Position: Sitting, Cuff Size: Large)   Pulse (!) 59   Ht 5\' 6"  (1.676 m)   Wt 184 lb (83.5 kg)   BMI 29.70 kg/m   EXAM: Deferred  EKG: Deferred  ASSESSMENT: 1. Dyslipidemia 2. Hypertension - uncontrolled 3. Palpitations 4. Family history of cardiovascular disease 5. 0 coronary calcium score (11/2017)  PLAN: 1.   Mrs. Colao was found to have a 0 coronary artery calcium score.  Cholesterol is noted to be improved on pravastatin.  Although she has no coronary artery calcium at this point in her risk is very low going forward, when considering her 30-year risk is reasonable to try to obtain an LDL cholesterol less than 100.  She is agreeable to this will continue on her current dose pravastatin.  We will plan follow-up annually or sooner as necessary.  Pixie Casino, MD, [facility], [place] Director of the Advanced Lipid Disorders &  Cardiovascular Risk Reduction Clinic Diplomate of the American Board of Clinical Lipidology Attending Cardiologist  Direct Dial: [phone]  Fax: [phone]  Website:  [email or web address] 03/16/2018, 8:46 AM

## 2018-03-16 NOTE — Patient Instructions (Signed)
Your physician recommends that you continue on your current medications as directed. Please refer to the Current Medication list given to you today.  Your physician wants you to follow-up in: ONE YEAR with Dr. Hilty. You will receive a reminder letter in the mail two months in advance. If you don't receive a letter, please call our office to schedule the follow-up appointment.  

## 2018-03-17 DIAGNOSIS — K589 Irritable bowel syndrome without diarrhea: Secondary | ICD-10-CM | POA: Diagnosis not present

## 2018-03-17 DIAGNOSIS — D259 Leiomyoma of uterus, unspecified: Secondary | ICD-10-CM | POA: Diagnosis not present

## 2018-03-17 NOTE — Telephone Encounter (Signed)
Patient seen in office 03/16/18 - results reviewed with patient by MD

## 2018-03-25 DIAGNOSIS — E785 Hyperlipidemia, unspecified: Secondary | ICD-10-CM | POA: Diagnosis not present

## 2018-03-25 DIAGNOSIS — I10 Essential (primary) hypertension: Secondary | ICD-10-CM | POA: Diagnosis not present

## 2018-03-25 DIAGNOSIS — M109 Gout, unspecified: Secondary | ICD-10-CM | POA: Diagnosis not present

## 2018-04-03 DIAGNOSIS — M109 Gout, unspecified: Secondary | ICD-10-CM | POA: Diagnosis not present

## 2018-04-09 DIAGNOSIS — Z809 Family history of malignant neoplasm, unspecified: Secondary | ICD-10-CM | POA: Diagnosis not present

## 2018-04-23 DIAGNOSIS — E785 Hyperlipidemia, unspecified: Secondary | ICD-10-CM | POA: Diagnosis not present

## 2018-04-23 DIAGNOSIS — I10 Essential (primary) hypertension: Secondary | ICD-10-CM | POA: Diagnosis not present

## 2018-04-23 DIAGNOSIS — N3281 Overactive bladder: Secondary | ICD-10-CM | POA: Diagnosis not present

## 2018-04-23 DIAGNOSIS — E876 Hypokalemia: Secondary | ICD-10-CM | POA: Diagnosis not present

## 2018-05-15 DIAGNOSIS — M1000 Idiopathic gout, unspecified site: Secondary | ICD-10-CM | POA: Diagnosis not present

## 2018-07-06 DIAGNOSIS — R52 Pain, unspecified: Secondary | ICD-10-CM | POA: Diagnosis not present

## 2018-07-06 DIAGNOSIS — R200 Anesthesia of skin: Secondary | ICD-10-CM | POA: Diagnosis not present

## 2018-07-06 DIAGNOSIS — M180 Bilateral primary osteoarthritis of first carpometacarpal joints: Secondary | ICD-10-CM | POA: Diagnosis not present

## 2018-07-06 DIAGNOSIS — M542 Cervicalgia: Secondary | ICD-10-CM | POA: Diagnosis not present

## 2018-07-07 ENCOUNTER — Encounter: Payer: Self-pay | Admitting: Neurology

## 2018-07-07 DIAGNOSIS — R200 Anesthesia of skin: Secondary | ICD-10-CM | POA: Diagnosis not present

## 2018-07-07 DIAGNOSIS — R52 Pain, unspecified: Secondary | ICD-10-CM | POA: Diagnosis not present

## 2018-07-08 ENCOUNTER — Other Ambulatory Visit: Payer: Self-pay | Admitting: *Deleted

## 2018-07-08 DIAGNOSIS — R200 Anesthesia of skin: Secondary | ICD-10-CM

## 2018-07-16 ENCOUNTER — Ambulatory Visit (INDEPENDENT_AMBULATORY_CARE_PROVIDER_SITE_OTHER): Payer: BLUE CROSS/BLUE SHIELD | Admitting: Neurology

## 2018-07-16 DIAGNOSIS — R200 Anesthesia of skin: Secondary | ICD-10-CM | POA: Diagnosis not present

## 2018-07-16 DIAGNOSIS — M5412 Radiculopathy, cervical region: Secondary | ICD-10-CM

## 2018-07-16 NOTE — Procedures (Signed)
[facility]  [address]  [place], [place] [address] Tel: [phone] Fax:  [phone] Test Date:  07/16/2018  Patient: [name] DOB: [birth year] Physician: Narda Amber, DO  Sex: Female Height: 5\' 6"  Ref Phys: Daryll Brod, MD  ID#: [id number] Temp: 36.0C Technician:    Patient Complaints: This is a 67 year old female referred for evaluation of bilateral hand pain and numbness.  NCV & EMG Findings: Extensive electrolyte diagnostic testing of the right upper extremity and additional studies of the left shows:  1. Bilateral median, ulnar, and mixed palmar sensory responses are within normal limits. 2. Bilateral median and ulnar motor responses are within normal limits. 3. Chronic motor axonal loss changes are seen affecting the C6-7 myotomes bilaterally, without accompanied active denervation.   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 There is no evidence of carpal tunnel syndrome affecting the upper extremities.   ___________________________ Narda Amber, DO    Nerve Conduction Studies Anti Sensory Summary Table   Site NR Peak (ms) Norm Peak (ms) P-T Amp (V) Norm P-T Amp  Left Median Anti Sensory (2nd Digit)  36C  Wrist    2.8 <3.8 38.5 >10  Right Median Anti Sensory (2nd Digit)  36C  Wrist    2.8 <3.8 32.7 >10  Left Ulnar Anti Sensory (5th Digit)  36C  Wrist    2.6 <3.2 36.8 >5  Right Ulnar Anti Sensory (5th Digit)  36C  Wrist    2.4 <3.2 30.8 >5   Motor Summary Table   Site NR Onset (ms) Norm Onset (ms) O-P Amp (mV) Norm O-P Amp Site1 Site2 Delta-0 (ms) Dist (cm) Vel (m/s) Norm Vel (m/s)  Left Median Motor (Abd Poll Brev)  36C  Wrist    2.6 <4.0 8.0 >5 Elbow Wrist 4.8 29.0 60 >50  Elbow    7.4  7.9         Right Median Motor (Abd Poll Brev)  36C  Wrist    2.3 <4.0 8.3 >5 Elbow Wrist 4.9 28.0 57 >50  Elbow    7.2  8.3         Left Ulnar Motor (Abd Dig Minimi)   36C  Wrist    1.8 <3.1 7.7 >7 B Elbow Wrist 3.5 22.0 63 >50  B Elbow    5.3  7.4  A Elbow B Elbow 1.8 10.0 56 >50  A Elbow    7.1  7.3         Right Ulnar Motor (Abd Dig Minimi)  36C  Wrist    1.6 <3.1 7.6 >7 B Elbow Wrist 3.6 23.0 64 >50  B Elbow    5.2  6.5  A Elbow B Elbow 1.8 10.0 56 >50  A Elbow    7.0  6.3          Comparison Summary Table   Site NR Peak (ms) Norm Peak (ms) P-T Amp (V) Site1 Site2 Delta-P (ms) Norm Delta (ms)  Left Median/Ulnar Palm Comparison (Wrist - 8cm)  36C  Median Palm    1.6 <2.2 50.8 Median Palm Ulnar Palm 0.1   Ulnar Palm    1.5 <2.2 17.6      Right Median/Ulnar Palm Comparison (Wrist - 8cm)  36C  Median Palm    1.7 <2.2 29.0 Median Palm Ulnar Palm 0.1   Ulnar Palm    1.6 <2.2 13.0       EMG   Side Muscle Ins Act Fibs  Psw Fasc Number Recrt Dur Dur. Amp Amp. Poly Poly. Comment  Right 1stDorInt Nml Nml Nml Nml Nml Nml Nml Nml Nml Nml Nml Nml N/A  Right Ext Indicis Nml Nml Nml Nml Nml Nml Nml Nml Nml Nml Nml Nml N/A  Right PronatorTeres Nml Nml Nml Nml 1- Rapid Some 1+ Some 1+ Nml Nml N/A  Right Biceps Nml Nml Nml Nml 1- Rapid Some 1+ Some 1+ Nml Nml N/A  Right Triceps Nml Nml Nml Nml 1- Rapid Some 1+ Some 1+ Nml Nml N/A  Right Deltoid Nml Nml Nml Nml Nml Nml Nml Nml Nml Nml Nml Nml N/A  Left 1stDorInt Nml Nml Nml Nml Nml Nml Nml Nml Nml Nml Nml Nml N/A  Left Ext Indicis Nml Nml Nml Nml Nml Nml Nml Nml Nml Nml Nml Nml N/A  Left PronatorTeres Nml Nml Nml Nml 1- Rapid Some 1+ Some 1+ Nml Nml N/A  Left Biceps Nml Nml Nml Nml 1- Rapid Some 1+ Some 1+ Nml Nml N/A  Left Triceps Nml Nml Nml Nml 1- Rapid Some 1+ Some 1+ Nml Nml N/A  Left Deltoid Nml Nml Nml Nml Nml Nml Nml Nml Nml Nml Nml Nml N/A      Waveforms:

## 2018-08-05 DIAGNOSIS — M542 Cervicalgia: Secondary | ICD-10-CM | POA: Diagnosis not present

## 2018-08-05 DIAGNOSIS — M180 Bilateral primary osteoarthritis of first carpometacarpal joints: Secondary | ICD-10-CM | POA: Diagnosis not present

## 2018-08-05 DIAGNOSIS — R200 Anesthesia of skin: Secondary | ICD-10-CM | POA: Diagnosis not present

## 2018-08-11 DIAGNOSIS — M1000 Idiopathic gout, unspecified site: Secondary | ICD-10-CM | POA: Diagnosis not present

## 2018-08-16 ENCOUNTER — Other Ambulatory Visit: Payer: Self-pay

## 2018-08-16 ENCOUNTER — Encounter (HOSPITAL_COMMUNITY): Payer: Self-pay

## 2018-08-16 ENCOUNTER — Emergency Department (HOSPITAL_COMMUNITY): Payer: BLUE CROSS/BLUE SHIELD

## 2018-08-16 ENCOUNTER — Emergency Department (HOSPITAL_COMMUNITY)
Admission: EM | Admit: 2018-08-16 | Discharge: 2018-08-16 | Disposition: A | Discharge status: Home / Self Care | Payer: BLUE CROSS/BLUE SHIELD | Attending: Emergency Medicine | Admitting: Emergency Medicine

## 2018-08-16 DIAGNOSIS — E0965 Drug or chemical induced diabetes mellitus with hyperglycemia: Secondary | ICD-10-CM | POA: Diagnosis not present

## 2018-08-16 DIAGNOSIS — I10 Essential (primary) hypertension: Secondary | ICD-10-CM | POA: Insufficient documentation

## 2018-08-16 DIAGNOSIS — R0602 Shortness of breath: Secondary | ICD-10-CM | POA: Diagnosis present

## 2018-08-16 DIAGNOSIS — R0789 Other chest pain: Secondary | ICD-10-CM

## 2018-08-16 DIAGNOSIS — J4521 Mild intermittent asthma with (acute) exacerbation: Secondary | ICD-10-CM | POA: Diagnosis not present

## 2018-08-16 DIAGNOSIS — Z87891 Personal history of nicotine dependence: Secondary | ICD-10-CM | POA: Insufficient documentation

## 2018-08-16 DIAGNOSIS — T380X5A Adverse effect of glucocorticoids and synthetic analogues, initial encounter: Secondary | ICD-10-CM | POA: Insufficient documentation

## 2018-08-16 DIAGNOSIS — E785 Hyperlipidemia, unspecified: Secondary | ICD-10-CM | POA: Diagnosis not present

## 2018-08-16 DIAGNOSIS — R739 Hyperglycemia, unspecified: Secondary | ICD-10-CM | POA: Diagnosis not present

## 2018-08-16 DIAGNOSIS — R05 Cough: Secondary | ICD-10-CM | POA: Diagnosis not present

## 2018-08-16 LAB — BASIC METABOLIC PANEL
ANION GAP: 14 (ref 5–15)
BUN: 22 mg/dL (ref 8–23)
CHLORIDE: 93 mmol/L — AB (ref 98–111)
CO2: 23 mmol/L (ref 22–32)
Calcium: 9 mg/dL (ref 8.9–10.3)
Creatinine, Ser: 1.24 mg/dL — ABNORMAL HIGH (ref 0.44–1.00)
GFR calc Af Amer: 52 mL/min — ABNORMAL LOW (ref >60)
GFR, EST NON AFRICAN AMERICAN: 45 mL/min — AB (ref >60)
GLUCOSE: 467 mg/dL — AB (ref 70–99)
Potassium: 3.1 mmol/L — ABNORMAL LOW (ref 3.5–5.1)
Sodium: 130 mmol/L — ABNORMAL LOW (ref 135–145)

## 2018-08-16 LAB — CBC WITH DIFFERENTIAL/PLATELET
Abs Immature Granulocytes: 0.1 10*3/uL — ABNORMAL HIGH (ref 0.00–0.07)
Basophils Absolute: 0 10*3/uL (ref 0.0–0.1)
Basophils Relative: 0 %
Eosinophils Absolute: 0 10*3/uL (ref 0.0–0.5)
Eosinophils Relative: 0 %
HEMATOCRIT: 41.4 % (ref 36.0–46.0)
HEMOGLOBIN: 14.5 g/dL (ref 12.0–15.0)
IMMATURE GRANULOCYTES: 1 %
LYMPHS ABS: 0.8 10*3/uL (ref 0.7–4.0)
Lymphocytes Relative: 8 %
MCH: 30.2 pg (ref 26.0–34.0)
MCHC: 35 g/dL (ref 30.0–36.0)
MCV: 86.3 fL (ref 80.0–100.0)
MONO ABS: 0.6 10*3/uL (ref 0.1–1.0)
MONOS PCT: 6 %
NEUTROS PCT: 85 %
Neutro Abs: 8.7 10*3/uL — ABNORMAL HIGH (ref 1.7–7.7)
Platelets: 167 10*3/uL (ref 150–400)
RBC: 4.8 MIL/uL (ref 3.87–5.11)
RDW: 13.1 % (ref 11.5–15.5)
WBC: 10.2 10*3/uL (ref 4.0–10.5)
nRBC: 0 % (ref 0.0–0.2)

## 2018-08-16 LAB — POCT I-STAT TROPONIN I: Troponin i, poc: 0 ng/mL (ref 0.00–0.08)

## 2018-08-16 LAB — GLUCOSE, CAPILLARY
GLUCOSE-CAPILLARY: 361 mg/dL — AB (ref 70–99)
Glucose-Capillary: 423 mg/dL — ABNORMAL HIGH (ref 70–99)
Glucose-Capillary: 314 mg/dL — ABNORMAL HIGH (ref 70–99)

## 2018-08-16 MED ORDER — BENZONATATE 100 MG PO CAPS: 100.0000 mg | ORAL_CAPSULE | Freq: Three times a day (TID) | ORAL | 0 refills | Status: DC | PRN

## 2018-08-16 MED ORDER — SODIUM CHLORIDE 0.9 % IV BOLUS (SEPSIS)
1000.0000 mL | Freq: Once | INTRAVENOUS | Status: AC
  Administered 2018-08-16: 1000 mL via INTRAVENOUS

## 2018-08-16 MED ORDER — INSULIN ASPART 100 UNIT/ML ~~LOC~~ SOLN
10.0000 [IU] | Freq: Once | SUBCUTANEOUS | Status: AC
  Administered 2018-08-16: 10 [IU] via SUBCUTANEOUS
  Filled 2018-08-16: qty 1

## 2018-08-16 MED ORDER — INSULIN ASPART 100 UNIT/ML ~~LOC~~ SOLN
5.0000 [IU] | Freq: Once | SUBCUTANEOUS | Status: AC
  Administered 2018-08-16: 5 [IU] via INTRAVENOUS
  Filled 2018-08-16: qty 1

## 2018-08-16 MED ORDER — IPRATROPIUM BROMIDE 0.02 % IN SOLN
0.5000 mg | Freq: Once | RESPIRATORY_TRACT | Status: AC
  Administered 2018-08-16: 0.5 mg via RESPIRATORY_TRACT
  Filled 2018-08-16: qty 2.5

## 2018-08-16 MED ORDER — ALBUTEROL SULFATE (2.5 MG/3ML) 0.083% IN NEBU
5.0000 mg | INHALATION_SOLUTION | Freq: Once | RESPIRATORY_TRACT | Status: AC
  Administered 2018-08-16: 5 mg via RESPIRATORY_TRACT
  Filled 2018-08-16: qty 6

## 2018-08-16 MED ORDER — ALBUTEROL SULFATE HFA 108 (90 BASE) MCG/ACT IN AERS: 2.0000 | INHALATION_SPRAY | RESPIRATORY_TRACT | 0 refills | Status: DC | PRN

## 2018-08-16 MED ORDER — ALBUTEROL SULFATE (2.5 MG/3ML) 0.083% IN NEBU: 5.0000 mg | INHALATION_SOLUTION | Freq: Once | RESPIRATORY_TRACT | Status: DC

## 2018-08-16 MED ORDER — METHYLPREDNISOLONE SODIUM SUCC 125 MG IJ SOLR
125.0000 mg | Freq: Once | INTRAMUSCULAR | Status: AC
  Administered 2018-08-16: 125 mg via INTRAVENOUS
  Filled 2018-08-16: qty 2

## 2018-08-16 NOTE — Discharge Instructions (Addendum)
I recommend that you stop taking your prednisone as it is causing your blood sugar to become very elevated.  Recommend close follow-up with your primary care physician next week.  Your chest x-ray was normal today and does not show any sign of bacterial infection.  You do not need antibiotics today.  You may use over-the-counter guaifenesin and dextromethorphan as needed for cough.

## 2018-08-16 NOTE — ED Provider Notes (Signed)
TIME SEEN: 12:31 AM  CHIEF COMPLAINT: Shortness of breath  HPI: Patient is a 66 year old female with history of hypertension, hyperlipidemia, asthma who presents to the emergency department with 2 weeks of nonproductive cough, wheezing, shortness of breath.  Has chest tightness mostly with coughing.  No history of PE or DVT.  No lower extremity swelling or pain.  Currently on 40 mg of prednisone daily without relief.  ROS: See HPI Constitutional: no fever  Eyes: no drainage  ENT: no runny nose   Cardiovascular:   chest pain  Resp: SOB  GI: no vomiting GU: no dysuria Integumentary: no rash  Allergy: no hives  Musculoskeletal: no leg swelling  Neurological: no slurred speech ROS otherwise negative  PAST MEDICAL HISTORY/PAST SURGICAL HISTORY:  Past Medical History:  Diagnosis Date  . Allergy   . Asthma   . Back pain   . Cancer ([place])    skin cancer  . Depression   . Hyperlipidemia   . Hypertension     MEDICATIONS:  Prior to Admission medications   Medication Sig Start Date End Date Taking? Authorizing Provider  atenolol-chlorthalidone (TENORETIC) 50-25 MG tablet Take 1 tablet by mouth daily. 11/21/17   Hilty, Nadean Corwin, MD  Biotin 1000 MCG tablet Take 1,000 mcg by mouth daily.    [provider]  budesonide (PULMICORT) 180 MCG/ACT inhaler Inhale 2 puffs into the lungs 2 (two) times daily as needed (for shortness of breath).     [provider]  esomeprazole (NEXIUM) 20 MG capsule Take 20 mg by mouth daily at 12 noon.    [provider]  montelukast (SINGULAIR) 10 MG tablet TAKE 1 BY MOUTH DAILY --KATHERINE HEMBERG 06/07/16   [provider]  Multiple Vitamin (MULTIVITAMIN) capsule Take 1 capsule by mouth 2 (two) times a week.     [provider]  pravastatin (PRAVACHOL) 40 MG tablet Take 1 tablet (40 mg total) by mouth daily. 11/21/17   Hilty, Nadean Corwin, MD  PROAIR HFA 108 (90 BASE) MCG/ACT inhaler Inhale 2 puffs into the lungs as needed.  10/25/14   [provider]  sertraline (ZOLOFT) 100 MG tablet Take 50-100 mg by mouth every other day.  09/13/14 07/12/15  [provider]  Vitamin D, Ergocalciferol, (DRISDOL) 50000 units CAPS capsule Take 50,000 Units by mouth once a week. 05/14/16   [provider]    ALLERGIES:  Allergies  Allergen Reactions  . Bee Venom Swelling  . Crestor [Rosuvastatin] Other (See Comments)    Myalgias  . Erythromycin Itching, Rash and Other (See Comments)  . Latex Itching and Rash    rash  . Rosuvastatin Calcium Other (See Comments)    Myalgias  . Aleve [Naproxen Sodium] Other (See Comments)    unknown  . Naproxen Other (See Comments)    unknown  . Tetracyclines & Related Other (See Comments)    unknown  . Vytorin [Ezetimibe-Simvastatin] Other (See Comments)    unknown    SOCIAL HISTORY:  Social History   Tobacco Use  . Smoking status: Never Smoker  . Smokeless tobacco: Never Used  Substance Use Topics  . Alcohol use: No    FAMILY HISTORY: Family History  Problem Relation Age of Onset  . Heart disease Mother   . Kidney disease Mother   . Breast cancer Mother   . Coronary artery disease Mother        CABG at age 54  . Dementia Father   . Stroke Father   . Stroke  Maternal Grandmother   . Cancer Maternal Grandfather   . Cancer Paternal Grandmother   . Kidney disease Paternal Grandfather     EXAM: BP (!) 157/104   Pulse 96   Temp 98 F (36.7 C) (Oral)   Resp (!) 21   SpO2 98%  CONSTITUTIONAL: Alert and oriented and responds appropriately to questions. Well-appearing; well-nourished HEAD: Normocephalic EYES: Conjunctivae clear, pupils appear equal, EOMI ENT: normal nose; moist mucous membranes NECK: Supple, no meningismus, no nuchal rigidity, no LAD  CARD: RRR; S1 and S2 appreciated; no murmurs, no clicks, no rubs, no gallops RESP: Normal chest excursion without splinting or tachypnea; patient has expiratory wheezes appreciated bilaterally, no  rhonchi, no rales, no hypoxia or respiratory distress, speaking full sentences ABD/GI: Normal bowel sounds; non-distended; soft, non-tender, no rebound, no guarding, no peritoneal signs, no hepatosplenomegaly BACK:  The back appears normal and is non-tender to palpation, there is no CVA tenderness EXT: Normal ROM in all joints; non-tender to palpation; no edema; normal capillary refill; no cyanosis, no calf tenderness or swelling    SKIN: Normal color for age and race; warm; no rash NEURO: Moves all extremities equally PSYCH: The patient's mood and manner are appropriate. Grooming and personal hygiene are appropriate.  MEDICAL DECISION MAKING: Patient here with likely asthma exacerbation.  She is wheezing on exam but in no respiratory distress.  Does complain of some chest tightness but mostly with coughing.  EKG shows no ischemic abnormality.  Will give albuterol, Atrovent, Solu-Medrol.  Will obtain cardiac labs.  Chest x-ray shows no acute abnormality.  No sign of volume overload.  No sign of pneumonia.  Doubt PE.  ED PROGRESS: Patient's labs unremarkable other than glucose of 467.  Bicarb and anion gap normal.  She is not in DKA.  She is not a diabetic.  This is likely from her steroid use.  She has already been on 5 days of steroids.  She states she has 2 more weeks prescribed.  I have advised her to stop taking steroids after today and contact her primary doctor.  We will give IV fluids and insulin here.  Reports feeling better after breathing treatment.  Will give second breathing treatment here in the emergency department while we are working on bringing her blood sugar down.  Blood glucose has improved from 467 to 314.  Have advised her to stop the prednisone and follow-up closely with her primary care physician.  She will continue her albuterol inhaler as needed.  I do not feel she needs antibiotics at this time.    At this time, I do not feel there is any life-threatening condition present.  I have reviewed and discussed all results (EKG, imaging, lab, urine as appropriate) and exam findings with patient/family. I have reviewed nursing notes and appropriate previous records.  I feel the patient is safe to be discharged home without further emergent workup and can continue workup as an outpatient as needed. Discussed usual and customary return precautions. Patient/family verbalize understanding and are comfortable with this plan.  Outpatient follow-up has been provided as needed. All questions have been answered.   EKG Interpretation  Date/Time:  Sunday August 16 2018 00:26:49 EST Ventricular Rate:  69 PR Interval:    QRS Duration: 108 QT Interval:  423 QTC Calculation: 454 R Axis:   66 Text Interpretation:  Sinus rhythm Ventricular trigeminy which is new compared to prior Confirmed by Luanne Krzyzanowski, Cyril Mourning [phone]) on 08/16/2018 12:32:26 AM  Jaydynn Wolford, Delice Bison, DO 08/16/18 [phone]

## 2018-08-16 NOTE — ED Notes (Signed)
Called respiratory to bedside. 

## 2018-08-16 NOTE — ED Triage Notes (Signed)
Pt reports cough and SOB x 2 weeks. She endorses a hx of asthma. She states that her cough worsened tonight. She denies chest pain, but states that her chest is tight when she coughs. A&Ox4.

## 2018-08-16 NOTE — ED Notes (Signed)
Patient requested to urinate before being treated.

## 2018-08-17 DIAGNOSIS — F419 Anxiety disorder, unspecified: Secondary | ICD-10-CM | POA: Diagnosis not present

## 2018-08-17 DIAGNOSIS — J4521 Mild intermittent asthma with (acute) exacerbation: Secondary | ICD-10-CM | POA: Diagnosis not present

## 2018-08-17 DIAGNOSIS — E1165 Type 2 diabetes mellitus with hyperglycemia: Secondary | ICD-10-CM | POA: Diagnosis not present

## 2018-08-17 DIAGNOSIS — I10 Essential (primary) hypertension: Secondary | ICD-10-CM | POA: Diagnosis not present

## 2018-08-17 DIAGNOSIS — J45909 Unspecified asthma, uncomplicated: Secondary | ICD-10-CM | POA: Diagnosis not present

## 2018-08-18 DIAGNOSIS — I10 Essential (primary) hypertension: Secondary | ICD-10-CM | POA: Diagnosis not present

## 2018-08-18 DIAGNOSIS — E1165 Type 2 diabetes mellitus with hyperglycemia: Secondary | ICD-10-CM | POA: Diagnosis not present

## 2018-08-18 DIAGNOSIS — J4521 Mild intermittent asthma with (acute) exacerbation: Secondary | ICD-10-CM | POA: Diagnosis not present

## 2018-08-20 DIAGNOSIS — E1165 Type 2 diabetes mellitus with hyperglycemia: Secondary | ICD-10-CM | POA: Diagnosis not present

## 2018-08-20 DIAGNOSIS — J4521 Mild intermittent asthma with (acute) exacerbation: Secondary | ICD-10-CM | POA: Diagnosis not present

## 2018-08-26 DIAGNOSIS — J441 Chronic obstructive pulmonary disease with (acute) exacerbation: Secondary | ICD-10-CM | POA: Diagnosis not present

## 2018-08-26 DIAGNOSIS — E1165 Type 2 diabetes mellitus with hyperglycemia: Secondary | ICD-10-CM | POA: Diagnosis not present

## 2018-08-27 DIAGNOSIS — M62830 Muscle spasm of back: Secondary | ICD-10-CM | POA: Diagnosis not present

## 2018-08-27 DIAGNOSIS — M9902 Segmental and somatic dysfunction of thoracic region: Secondary | ICD-10-CM | POA: Diagnosis not present

## 2018-08-27 DIAGNOSIS — M9901 Segmental and somatic dysfunction of cervical region: Secondary | ICD-10-CM | POA: Diagnosis not present

## 2018-08-27 DIAGNOSIS — M9904 Segmental and somatic dysfunction of sacral region: Secondary | ICD-10-CM | POA: Diagnosis not present

## 2018-08-31 DIAGNOSIS — H52223 Regular astigmatism, bilateral: Secondary | ICD-10-CM | POA: Diagnosis not present

## 2018-09-01 DIAGNOSIS — M9902 Segmental and somatic dysfunction of thoracic region: Secondary | ICD-10-CM | POA: Diagnosis not present

## 2018-09-01 DIAGNOSIS — M62830 Muscle spasm of back: Secondary | ICD-10-CM | POA: Diagnosis not present

## 2018-09-01 DIAGNOSIS — M9901 Segmental and somatic dysfunction of cervical region: Secondary | ICD-10-CM | POA: Diagnosis not present

## 2018-09-01 DIAGNOSIS — M9904 Segmental and somatic dysfunction of sacral region: Secondary | ICD-10-CM | POA: Diagnosis not present

## 2018-09-03 DIAGNOSIS — M9904 Segmental and somatic dysfunction of sacral region: Secondary | ICD-10-CM | POA: Diagnosis not present

## 2018-09-03 DIAGNOSIS — M9902 Segmental and somatic dysfunction of thoracic region: Secondary | ICD-10-CM | POA: Diagnosis not present

## 2018-09-03 DIAGNOSIS — M9901 Segmental and somatic dysfunction of cervical region: Secondary | ICD-10-CM | POA: Diagnosis not present

## 2018-09-03 DIAGNOSIS — M62830 Muscle spasm of back: Secondary | ICD-10-CM | POA: Diagnosis not present

## 2018-09-18 DIAGNOSIS — M5412 Radiculopathy, cervical region: Secondary | ICD-10-CM | POA: Insufficient documentation

## 2018-09-18 DIAGNOSIS — M5030 Other cervical disc degeneration, unspecified cervical region: Secondary | ICD-10-CM | POA: Diagnosis not present

## 2018-09-18 DIAGNOSIS — R200 Anesthesia of skin: Secondary | ICD-10-CM | POA: Diagnosis not present

## 2018-09-18 DIAGNOSIS — M542 Cervicalgia: Secondary | ICD-10-CM | POA: Diagnosis not present

## 2018-10-25 ENCOUNTER — Other Ambulatory Visit: Payer: Self-pay | Admitting: Internal Medicine

## 2018-10-27 DIAGNOSIS — J301 Allergic rhinitis due to pollen: Secondary | ICD-10-CM | POA: Diagnosis not present

## 2018-10-27 DIAGNOSIS — I10 Essential (primary) hypertension: Secondary | ICD-10-CM | POA: Diagnosis not present

## 2018-10-27 DIAGNOSIS — E119 Type 2 diabetes mellitus without complications: Secondary | ICD-10-CM | POA: Diagnosis not present

## 2018-10-27 DIAGNOSIS — J4520 Mild intermittent asthma, uncomplicated: Secondary | ICD-10-CM | POA: Diagnosis not present

## 2018-11-02 DIAGNOSIS — H16223 Keratoconjunctivitis sicca, not specified as Sjogren's, bilateral: Secondary | ICD-10-CM | POA: Diagnosis not present

## 2018-11-02 DIAGNOSIS — H26493 Other secondary cataract, bilateral: Secondary | ICD-10-CM | POA: Diagnosis not present

## 2018-11-11 DIAGNOSIS — Z85828 Personal history of other malignant neoplasm of skin: Secondary | ICD-10-CM | POA: Diagnosis not present

## 2018-11-11 DIAGNOSIS — D485 Neoplasm of uncertain behavior of skin: Secondary | ICD-10-CM | POA: Diagnosis not present

## 2018-11-11 DIAGNOSIS — D225 Melanocytic nevi of trunk: Secondary | ICD-10-CM | POA: Diagnosis not present

## 2018-11-11 DIAGNOSIS — D2330 Other benign neoplasm of skin of unspecified part of face: Secondary | ICD-10-CM | POA: Diagnosis not present

## 2018-11-11 DIAGNOSIS — L570 Actinic keratosis: Secondary | ICD-10-CM | POA: Diagnosis not present

## 2018-12-02 ENCOUNTER — Encounter: Payer: Self-pay | Admitting: Nutrition

## 2018-12-02 ENCOUNTER — Other Ambulatory Visit: Payer: Self-pay

## 2018-12-02 ENCOUNTER — Encounter: Payer: BLUE CROSS/BLUE SHIELD | Attending: Adult Health Nurse Practitioner | Admitting: Nutrition

## 2018-12-02 VITALS — Ht 66.0 in | Wt 174.0 lb

## 2018-12-02 DIAGNOSIS — T380X5A Adverse effect of glucocorticoids and synthetic analogues, initial encounter: Secondary | ICD-10-CM | POA: Diagnosis not present

## 2018-12-02 DIAGNOSIS — E099 Drug or chemical induced diabetes mellitus without complications: Secondary | ICD-10-CM | POA: Insufficient documentation

## 2018-12-02 NOTE — Patient Instructions (Signed)
Goals Follow My Plate  Eat 2-3 carb choices per meal Avoid snacks Increase water Increase exericise 60 minutes 3-4 times per week Check BS in am and before bed

## 2018-12-02 NOTE — Progress Notes (Signed)
  Medical Nutrition Therapy:  Appt start time: 0830 end time:  0930.   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 Lives with her husband. Eats three meals per day. Was in hospital in Dec 2019 for an asthma problem and was given steroids. BS were in the 400's. Wasn't put on medication. She is working on improving her diet and trying to control her BS with diet and exercise.    Admits she was eating more processed and unhealthy foods. Has been working on eating better and exercising.  Check BS twice a day. FBS 80-130's and BS bedtime 100-150's sometimes.  She notes she has lost about  6 lbs in the last month or two while watching what she is eating.  Some Dm in her extended family.  A1C in 2016 was 5.8% Prediabetic range.   Motivated to continue to work on eating better to improve her health and reduce complications and control Dm with diet and exercise.  CMP Latest Ref Rng & Units 08/16/2018 06/28/2016 12/06/2014  Glucose 70 - 99 mg/dL 467(H) 107(H) 87  BUN 8 - 23 mg/dL 22 19 11   Creatinine 0.44 - 1.00 mg/dL 1.24(H) 1.17(H) 0.90  Sodium 135 - 145 mmol/L 130(L) 139 141  Potassium 3.5 - 5.1 mmol/L 3.1(L) 4.4 3.5  Chloride 98 - 111 mmol/L 93(L) 99 106  CO2 22 - 32 mmol/L 23 28 -  Calcium 8.9 - 10.3 mg/dL 9.0 9.7 -  Total Protein 6.0 - 8.3 g/dL - - -  Total Bilirubin 0.3 - 1.2 mg/dL - - -  Alkaline Phos 39 - 117 U/L - - -  AST 0 - 37 U/L - - -  ALT 0 - 35 U/L - - -   Lab Results  Component Value Date   HGBA1C 5.8 (H) 12/09/2014     Preferred Learning Style:   Auditory  Visual  Hands on    Learning Readiness:   Ready  Change in progress   MEDICATIONS: see list    DIETARY INTAKE:    Eats 3 meals and sometimes snacks between meals.  Usual physical activity: walks  Estimated energy needs: 1500  calories 170 g carbohydrates 112 g protein 42 g fat  Progress Towards Goal(s):  In progress.   Nutritional Diagnosis:  NB-1.1 Food  and nutrition-related knowledge deficit As related to DM Type 2.  As evidenced by elevated blood sugars, steriod induced.    Intervention:  Nutrition and Diabetes education provided on My Plate, CHO counting, meal planning, portion sizes, timing of meals, avoiding snacks between meals unless having a low blood sugar, target ranges for A1C and blood sugars, signs/symptoms and treatment of hyper/hypoglycemia, monitoring blood sugars, taking medications as prescribed, benefits of exercising 30 minutes per day and prevention of complications of DM. Marland KitchenGoals Follow My Plate  Eat 2-3 carb choices per meal Avoid snacks Increase water Increase exericise 60 minutes 3-4 times per week Check BS in am and before bed  Teaching Method Utilized:  Visual Auditory Hands on  Handouts given during visit include:  The Plate Method    Meal Plan Card  Diabetes Instructions.   Barriers to learning/adherence to lifestyle change: none  Demonstrated degree of understanding via:  Teach Back   Monitoring/Evaluation:  Dietary intake, exercise, , and body weight in 2 month(s). Follow up on A1C that will be done soon.

## 2018-12-04 ENCOUNTER — Ambulatory Visit (HOSPITAL_COMMUNITY): Payer: BLUE CROSS/BLUE SHIELD

## 2018-12-07 ENCOUNTER — Telehealth: Payer: Self-pay | Admitting: Internal Medicine

## 2018-12-07 DIAGNOSIS — M9903 Segmental and somatic dysfunction of lumbar region: Secondary | ICD-10-CM | POA: Diagnosis not present

## 2018-12-07 DIAGNOSIS — M9901 Segmental and somatic dysfunction of cervical region: Secondary | ICD-10-CM | POA: Diagnosis not present

## 2018-12-07 DIAGNOSIS — M62830 Muscle spasm of back: Secondary | ICD-10-CM | POA: Diagnosis not present

## 2018-12-07 DIAGNOSIS — M9902 Segmental and somatic dysfunction of thoracic region: Secondary | ICD-10-CM | POA: Diagnosis not present

## 2018-12-07 NOTE — Telephone Encounter (Signed)
New Message   [name] with [facility] scheduling is calling in reference to the CT that the patient is schedule to have 4/20. She says that the order is not correct and has to be corrected. Please call to discuss.

## 2018-12-20 ENCOUNTER — Encounter (HOSPITAL_COMMUNITY): Payer: Self-pay

## 2018-12-20 ENCOUNTER — Other Ambulatory Visit: Payer: Self-pay

## 2018-12-20 ENCOUNTER — Emergency Department (HOSPITAL_COMMUNITY)
Admission: EM | Admit: 2018-12-20 | Discharge: 2018-12-20 | Disposition: A | Discharge status: Home / Self Care | Payer: BLUE CROSS/BLUE SHIELD | Attending: Emergency Medicine | Admitting: Emergency Medicine

## 2018-12-20 DIAGNOSIS — Z91040 Latex allergy status: Secondary | ICD-10-CM | POA: Diagnosis not present

## 2018-12-20 DIAGNOSIS — I10 Essential (primary) hypertension: Secondary | ICD-10-CM | POA: Insufficient documentation

## 2018-12-20 DIAGNOSIS — J45909 Unspecified asthma, uncomplicated: Secondary | ICD-10-CM | POA: Diagnosis not present

## 2018-12-20 DIAGNOSIS — H579 Unspecified disorder of eye and adnexa: Secondary | ICD-10-CM | POA: Diagnosis present

## 2018-12-20 DIAGNOSIS — Z79899 Other long term (current) drug therapy: Secondary | ICD-10-CM | POA: Insufficient documentation

## 2018-12-20 DIAGNOSIS — H0011 Chalazion right upper eyelid: Secondary | ICD-10-CM

## 2018-12-20 MED ORDER — TOBRAMYCIN 0.3 % OP SOLN
2.0000 [drp] | Freq: Once | OPHTHALMIC | Status: AC
  Administered 2018-12-20: 2 [drp] via OPHTHALMIC
  Filled 2018-12-20: qty 5

## 2018-12-20 MED ORDER — ACETAMINOPHEN 500 MG PO TABS
1000.0000 mg | ORAL_TABLET | Freq: Once | ORAL | Status: DC
  Filled 2018-12-20: qty 2

## 2018-12-20 NOTE — ED Triage Notes (Signed)
Pt reports soreness across the right eyelid for 3 days. Now has drainage since this am and increase in soreness

## 2018-12-20 NOTE — Discharge Instructions (Addendum)
Please wash your hands frequently.  Please use a warm compress to your eye 3 times daily.  Use Tylenol every 4 hours for pain or soreness.  Use 2 drops of tobramycin every 4 hours over the next 5 days.  Please see your ophthalmology specialist or return to the emergency department if not improving.

## 2018-12-20 NOTE — ED Notes (Signed)
HB in to eval

## 2018-12-20 NOTE — ED Notes (Signed)
R eyelid reddened and swollen x 3 days  Now tender and draining

## 2018-12-21 NOTE — Telephone Encounter (Signed)
Called radiology today- they changed the order. Will likely defer test due to COVID 19.  Dr. Lemmie Evens

## 2018-12-21 NOTE — Telephone Encounter (Signed)
Who informed pt radiology that the order that was placed on 12/05/17 for a CT chest nodule follow low dose without contrast is incorrect. She states pt can not have a follow up low dose if she's never had one before. She is questioning as to whether MD would like to change the order to CT with or without contrast. Will route to MD.

## 2018-12-21 NOTE — ED Provider Notes (Signed)
[facility] EMERGENCY DEPARTMENT Provider Note   CSN: [id number] Arrival date & time: 12/20/18  1705    History   Chief Complaint Chief Complaint  Patient presents with  . Eye Problem    HPI [name] is a 68 y.o. female.     The history is provided by the patient.  Eye Problem  Location:  Right eye Quality:  Foreign body sensation and aching Severity:  Moderate Onset quality:  Gradual Duration:  3 days Timing:  Constant Progression:  Worsening Chronicity:  New Relieved by:  Nothing Worsened by:  Nothing Ineffective treatments: warm compress. Associated symptoms: crusting, discharge, redness, swelling and tearing   Associated symptoms: no double vision, no photophobia and no vomiting     Past Medical History:  Diagnosis Date  . Allergy   . Asthma   . Back pain   . Cancer ([place])    skin cancer  . Depression   . Hyperlipidemia   . Hypertension     Patient Active Problem List   Diagnosis Date Noted  . Family history of heart disease 11/21/2017  . Other fatigue 11/21/2017  . Atypical chest pain 06/21/2016  . Seizures ([place]) 12/14/2014  . Transient memory loss 12/09/2014  . Essential hypertension 12/09/2014  . Mixed hyperlipidemia 12/09/2014    Past Surgical History:  Procedure Laterality Date  . bladder mesh    . COLONOSCOPY N/A 07/12/2015   Procedure: COLONOSCOPY;  Surgeon: Rogene Houston, MD;  Location: [facility];  Service: Endoscopy;  Laterality: N/A;  1030  . NM MYOCAR PERF WALL MOTION  02/2007   dipyridamole myoview; EF 70%, no wall motion abnormalities, no inducible ischemia, low risk   . OVARIAN CYST REMOVAL  1987  . TONSILECTOMY/ADENOIDECTOMY WITH MYRINGOTOMY  1958  . TONSILLECTOMY    . TRANSTHORACIC ECHOCARDIOGRAM  02/2007   RV mildly dlated; LV normal in size; mild MR with thickened MV leaflets; mild TR; mild pulm valve regurg  . VAGINAL DELIVERY     x3     OB History   No obstetric history on file.      Home Medications    Prior to Admission medications   Medication Sig Start Date End Date Taking? Authorizing Provider  albuterol (PROVENTIL HFA;VENTOLIN HFA) 108 (90 Base) MCG/ACT inhaler Inhale 2 puffs into the lungs every 4 (four) hours as needed for wheezing or shortness of breath. 08/16/18   Ward, Delice Bison, DO  allopurinol (ZYLOPRIM) 300 MG tablet Take 300 mg by mouth every evening.  08/03/18   [provider]  atenolol-chlorthalidone (TENORETIC) 50-25 MG tablet Take 1 tablet by mouth daily. 11/21/17   Hilty, Nadean Corwin, MD  benzonatate (TESSALON) 100 MG capsule Take 1 capsule (100 mg total) by mouth 3 (three) times daily as needed for cough. Keep this medication away from children. Patient not taking: Reported on 12/02/2018 08/16/18   Ward, Delice Bison, DO  Biotin 1 MG CAPS Take by mouth.    [provider]  budesonide (PULMICORT) 180 MCG/ACT inhaler Inhale 2 puffs into the lungs 2 (two) times daily.     [provider]  Dextromethorphan-guaiFENesin (COUGH & CHEST CONGESTION DM) 5-100 MG/5ML LIQD Take 5 mLs by mouth every 4 (four) hours as needed (cough and cold symptoms).    [provider]  esomeprazole (NEXIUM) 20 MG capsule Take 20 mg by mouth daily at 12 noon.    [provider]  ferrous sulfate 325 (65 FE) MG EC tablet Take 325 mg by  mouth 3 (three) times daily with meals.    [provider]  hydrocortisone 2.5 % cream Apply 1 application topically daily.  08/04/18   [provider]  ibuprofen (ADVIL,MOTRIN) 200 MG tablet Take 400 mg by mouth every 6 (six) hours as needed for headache.    [provider]  montelukast (SINGULAIR) 10 MG tablet Take 10 mg by mouth every morning.  06/07/16   [provider]  Multiple Vitamin (MULTIVITAMIN) capsule Take 1 capsule by mouth 2 (two) times a week.     [provider]  pravastatin (PRAVACHOL) 40 MG tablet TAKE 1 TABLET(40 MG) BY MOUTH DAILY 10/26/18   Hilty, Nadean Corwin, MD  PROAIR HFA 108  (90 BASE) MCG/ACT inhaler Inhale 2 puffs into the lungs 2 (two) times daily.  10/25/14   [provider]  sertraline (ZOLOFT) 100 MG tablet Take 50-100 mg by mouth every other day.  09/13/14 07/12/15  [provider]  sertraline (ZOLOFT) 25 MG tablet Take 25 mg by mouth daily.  08/03/18   [provider]  Vitamin D, Ergocalciferol, (DRISDOL) 50000 units CAPS capsule Take 50,000 Units by mouth once a week. Take on Sun. 05/14/16   [provider]    Family History Family History  Problem Relation Age of Onset  . Heart disease Mother   . Kidney disease Mother   . Breast cancer Mother   . Coronary artery disease Mother        CABG at age 45  . Dementia Father   . Stroke Father   . Stroke Maternal Grandmother   . Cancer Maternal Grandfather   . Cancer Paternal Grandmother   . Kidney disease Paternal Grandfather     Social History Social History   Tobacco Use  . Smoking status: Never Smoker  . Smokeless tobacco: Never Used  Substance Use Topics  . Alcohol use: No  . Drug use: No     Allergies   Bee venom; Crestor [rosuvastatin]; Erythromycin; Latex; Rosuvastatin calcium; Aleve [naproxen sodium]; Naproxen; Tetracyclines & related; and Vytorin [ezetimibe-simvastatin]   Review of Systems Review of Systems  Constitutional: Negative for activity change.       All ROS Neg except as noted in HPI  HENT: Negative for nosebleeds.   Eyes: Positive for discharge and redness. Negative for double vision and photophobia.  Respiratory: Negative for cough, shortness of breath and wheezing.   Cardiovascular: Negative for chest pain and palpitations.  Gastrointestinal: Negative for abdominal pain, blood in stool and vomiting.  Genitourinary: Negative for dysuria, frequency and hematuria.  Musculoskeletal: Negative for arthralgias, back pain and neck pain.  Skin: Negative.   Neurological: Negative for dizziness, seizures and speech difficulty.   Psychiatric/Behavioral: Negative for confusion and hallucinations.     Physical Exam Updated Vital Signs BP 124/82 (BP Location: Left Arm)   Pulse (!) 58   Temp (!) 96.6 F (35.9 C) (Oral)   Resp 18   Ht 5\' 6"  (1.676 m)   Wt 77.1 kg   SpO2 97%   BMI 27.44 kg/m   Physical Exam Vitals signs and nursing note reviewed.  Constitutional:      Appearance: She is well-developed. She is not toxic-appearing.  HENT:     Head: Normocephalic.     Right Ear: Tympanic membrane and external ear normal.     Left Ear: Tympanic membrane and external ear normal.  Eyes:     General: Lids are normal. No scleral icterus.  Right eye: Discharge present.     Pupils: Pupils are equal, round, and reactive to light.     Comments: There is mild swelling and mild redness of the lateral corner of the right upper eyelid.  There is a slightly raised red area under the lid.  There is scant amount of mucus in the eyelashes.  There is increased redness of the bulbar conjunctiva of the lower lid, and mild increased redness involving the conjunctiva.  The anterior chambers are clear.  The extraocular movements are intact.  Neck:     Musculoskeletal: Normal range of motion and neck supple.     Vascular: No carotid bruit.  Cardiovascular:     Rate and Rhythm: Normal rate and regular rhythm.     Pulses: Normal pulses.     Heart sounds: Normal heart sounds.  Pulmonary:     Effort: No respiratory distress.     Breath sounds: Normal breath sounds.  Abdominal:     General: Bowel sounds are normal.     Palpations: Abdomen is soft.     Tenderness: There is no abdominal tenderness. There is no guarding.  Musculoskeletal: Normal range of motion.  Lymphadenopathy:     Head:     Right side of head: No submandibular adenopathy.     Left side of head: No submandibular adenopathy.     Cervical: No cervical adenopathy.  Skin:    General: Skin is warm and dry.  Neurological:     Mental Status: She is alert and  oriented to person, place, and time.     Cranial Nerves: No cranial nerve deficit.     Sensory: No sensory deficit.  Psychiatric:        Speech: Speech normal.      ED Treatments / Results  Labs (all labs ordered are listed, but only abnormal results are displayed) Labs Reviewed - No data to display  EKG None  Radiology No results found.  Procedures Procedures (including critical care time)  Medications Ordered in ED Medications  tobramycin (TOBREX) 0.3 % ophthalmic solution 2 drop (2 drops Right Eye Given 12/20/18 1758)     Initial Impression / Assessment and Plan / ED Course  I have reviewed the triage vital signs and the nursing notes.  Pertinent labs & imaging results that were available during my care of the patient were reviewed by me and considered in my medical decision making (see chart for details).        Pt seen with me by Dr Gilford Raid.  Final Clinical Impressions(s) / ED Diagnoses MDM  The examination favors a she will lazy and.  The patient is advised to use warm compresses.  Patient will be treated with tobramycin eyedrops, patient is asked to use Tylenol every 4 hours for pain or discomfort.  The patient is asked to see the ophthalmology specialist, or return to the emergency department if any changes in condition, problems, or concerns.   Final diagnoses:  Chalazion of right upper eyelid    ED Discharge Orders    None       Lily Kocher, PA-C 12/21/18 1947    Isla Pence, MD 12/30/18 1100

## 2019-01-04 ENCOUNTER — Ambulatory Visit (HOSPITAL_COMMUNITY): Admission: RE | Admit: 2019-01-04 | Payer: BLUE CROSS/BLUE SHIELD | Source: Ambulatory Visit

## 2019-01-12 DIAGNOSIS — E1165 Type 2 diabetes mellitus with hyperglycemia: Secondary | ICD-10-CM | POA: Diagnosis not present

## 2019-01-14 DIAGNOSIS — E119 Type 2 diabetes mellitus without complications: Secondary | ICD-10-CM | POA: Diagnosis not present

## 2019-01-14 DIAGNOSIS — J4520 Mild intermittent asthma, uncomplicated: Secondary | ICD-10-CM | POA: Diagnosis not present

## 2019-01-14 DIAGNOSIS — I10 Essential (primary) hypertension: Secondary | ICD-10-CM | POA: Diagnosis not present

## 2019-01-19 ENCOUNTER — Ambulatory Visit (HOSPITAL_COMMUNITY): Payer: BLUE CROSS/BLUE SHIELD

## 2019-01-20 ENCOUNTER — Other Ambulatory Visit: Payer: Self-pay | Admitting: Internal Medicine

## 2019-01-20 NOTE — Telephone Encounter (Signed)
Tenoretic refilled.

## 2019-01-25 ENCOUNTER — Ambulatory Visit: Payer: BLUE CROSS/BLUE SHIELD | Admitting: Nutrition

## 2019-01-26 ENCOUNTER — Ambulatory Visit (HOSPITAL_COMMUNITY): Payer: BLUE CROSS/BLUE SHIELD

## 2019-02-01 ENCOUNTER — Telehealth: Payer: Self-pay | Admitting: Internal Medicine

## 2019-02-01 DIAGNOSIS — E782 Mixed hyperlipidemia: Secondary | ICD-10-CM

## 2019-02-01 DIAGNOSIS — R918 Other nonspecific abnormal finding of lung field: Secondary | ICD-10-CM

## 2019-02-01 DIAGNOSIS — I10 Essential (primary) hypertension: Secondary | ICD-10-CM

## 2019-02-01 DIAGNOSIS — R911 Solitary pulmonary nodule: Secondary | ICD-10-CM

## 2019-02-01 NOTE — Telephone Encounter (Signed)
New message:    Patient would like to know if she need a lab done before her appt. Please call patient back. If do not get her call on her cell phone.

## 2019-02-01 NOTE — Telephone Encounter (Signed)
Orders entered for bmet lipid and liver as well as new order for chest ct original ordered expired per pt ./cy

## 2019-02-22 DIAGNOSIS — M9903 Segmental and somatic dysfunction of lumbar region: Secondary | ICD-10-CM | POA: Diagnosis not present

## 2019-02-22 DIAGNOSIS — M9901 Segmental and somatic dysfunction of cervical region: Secondary | ICD-10-CM | POA: Diagnosis not present

## 2019-02-22 DIAGNOSIS — M62830 Muscle spasm of back: Secondary | ICD-10-CM | POA: Diagnosis not present

## 2019-02-22 DIAGNOSIS — M9902 Segmental and somatic dysfunction of thoracic region: Secondary | ICD-10-CM | POA: Diagnosis not present

## 2019-02-23 ENCOUNTER — Other Ambulatory Visit: Payer: Self-pay

## 2019-02-23 ENCOUNTER — Ambulatory Visit (HOSPITAL_COMMUNITY)
Admission: RE | Admit: 2019-02-23 | Discharge: 2019-02-23 | Disposition: A | Discharge status: Home / Self Care | Payer: BC Managed Care – PPO | Source: Ambulatory Visit | Attending: Internal Medicine | Admitting: Internal Medicine

## 2019-02-23 DIAGNOSIS — R918 Other nonspecific abnormal finding of lung field: Secondary | ICD-10-CM | POA: Insufficient documentation

## 2019-02-23 DIAGNOSIS — I700 Atherosclerosis of aorta: Secondary | ICD-10-CM | POA: Diagnosis not present

## 2019-02-23 DIAGNOSIS — R911 Solitary pulmonary nodule: Secondary | ICD-10-CM | POA: Diagnosis not present

## 2019-02-24 DIAGNOSIS — M62830 Muscle spasm of back: Secondary | ICD-10-CM | POA: Diagnosis not present

## 2019-02-24 DIAGNOSIS — M9903 Segmental and somatic dysfunction of lumbar region: Secondary | ICD-10-CM | POA: Diagnosis not present

## 2019-02-24 DIAGNOSIS — M9901 Segmental and somatic dysfunction of cervical region: Secondary | ICD-10-CM | POA: Diagnosis not present

## 2019-02-24 DIAGNOSIS — M9902 Segmental and somatic dysfunction of thoracic region: Secondary | ICD-10-CM | POA: Diagnosis not present

## 2019-03-22 DIAGNOSIS — Z1231 Encounter for screening mammogram for malignant neoplasm of breast: Secondary | ICD-10-CM | POA: Diagnosis not present

## 2019-03-22 DIAGNOSIS — Z01419 Encounter for gynecological examination (general) (routine) without abnormal findings: Secondary | ICD-10-CM | POA: Diagnosis not present

## 2019-03-22 DIAGNOSIS — Z6828 Body mass index (BMI) 28.0-28.9, adult: Secondary | ICD-10-CM | POA: Diagnosis not present

## 2019-04-03 ENCOUNTER — Other Ambulatory Visit: Payer: Self-pay | Admitting: Internal Medicine

## 2019-04-13 DIAGNOSIS — Z8041 Family history of malignant neoplasm of ovary: Secondary | ICD-10-CM | POA: Diagnosis not present

## 2019-04-18 ENCOUNTER — Other Ambulatory Visit: Payer: Self-pay | Admitting: Internal Medicine

## 2019-05-06 DIAGNOSIS — H16143 Punctate keratitis, bilateral: Secondary | ICD-10-CM | POA: Diagnosis not present

## 2019-05-06 DIAGNOSIS — H02885 Meibomian gland dysfunction left lower eyelid: Secondary | ICD-10-CM | POA: Diagnosis not present

## 2019-05-06 DIAGNOSIS — H02882 Meibomian gland dysfunction right lower eyelid: Secondary | ICD-10-CM | POA: Diagnosis not present

## 2019-05-10 ENCOUNTER — Other Ambulatory Visit: Payer: Self-pay | Admitting: *Deleted

## 2019-05-10 DIAGNOSIS — I10 Essential (primary) hypertension: Secondary | ICD-10-CM | POA: Diagnosis not present

## 2019-05-10 DIAGNOSIS — E782 Mixed hyperlipidemia: Secondary | ICD-10-CM

## 2019-05-11 LAB — BASIC METABOLIC PANEL
BUN/Creatinine Ratio: 17 (ref 12–28)
BUN: 16 mg/dL (ref 8–27)
CO2: 27 mmol/L (ref 20–29)
Calcium: 10.3 mg/dL (ref 8.7–10.3)
Chloride: 98 mmol/L (ref 96–106)
Creatinine, Ser: 0.94 mg/dL (ref 0.57–1.00)
GFR calc Af Amer: 72 mL/min/{1.73_m2} (ref >59)
GFR calc non Af Amer: 63 mL/min/{1.73_m2} (ref >59)
Glucose: 117 mg/dL — ABNORMAL HIGH (ref 65–99)
Potassium: 4 mmol/L (ref 3.5–5.2)
Sodium: 143 mmol/L (ref 134–144)

## 2019-05-11 LAB — HEPATIC FUNCTION PANEL
ALT: 35 IU/L — ABNORMAL HIGH (ref 0–32)
AST: 35 IU/L (ref 0–40)
Albumin: 5.1 g/dL — ABNORMAL HIGH (ref 3.8–4.8)
Alkaline Phosphatase: 78 IU/L (ref 39–117)
Bilirubin Total: 0.7 mg/dL (ref 0.0–1.2)
Bilirubin, Direct: 0.16 mg/dL (ref 0.00–0.40)
Total Protein: 6.7 g/dL (ref 6.0–8.5)

## 2019-05-11 LAB — LIPID PANEL
Chol/HDL Ratio: 4.3 ratio (ref 0.0–4.4)
Cholesterol, Total: 204 mg/dL — ABNORMAL HIGH (ref 100–199)
HDL: 48 mg/dL (ref >39)
LDL Calculated: 128 mg/dL — ABNORMAL HIGH (ref 0–99)
Triglycerides: 142 mg/dL (ref 0–149)
VLDL Cholesterol Cal: 28 mg/dL (ref 5–40)

## 2019-05-12 ENCOUNTER — Ambulatory Visit: Payer: BC Managed Care – PPO | Admitting: Internal Medicine

## 2019-05-12 ENCOUNTER — Encounter: Payer: Self-pay | Admitting: Internal Medicine

## 2019-05-12 ENCOUNTER — Other Ambulatory Visit: Payer: Self-pay

## 2019-05-12 VITALS — BP 114/76 | HR 61 | Temp 97.1°F | Ht 66.0 in | Wt 183.0 lb

## 2019-05-12 DIAGNOSIS — Z8249 Family history of ischemic heart disease and other diseases of the circulatory system: Secondary | ICD-10-CM

## 2019-05-12 DIAGNOSIS — E782 Mixed hyperlipidemia: Secondary | ICD-10-CM | POA: Diagnosis not present

## 2019-05-12 DIAGNOSIS — I10 Essential (primary) hypertension: Secondary | ICD-10-CM

## 2019-05-12 NOTE — Progress Notes (Signed)
OFFICE NOTE  Chief Complaint:  Routine follow-up  Primary Care Physician: Tanya Sleeper, PA-C  HPI:  [name] is a 68 y.o. female Who I previously seen in 2011 for palpitations and dyslipidemia. She is a former patient of Dr. Janene Simmons. Her mother [name] was a patient of mine who died recently. I have not seen her since 2011 although she had an appointment scheduled in 2015 which was canceled. Recently she's noted that she's had some elevated blood pressures. She was preparing for a beach trip and had some chest discomfort. This lasted for 2 or 3 hours and was described as a vertical line down the center of her chest. It was more of a dull pressure and resolved spontaneously. She's not had any exertional symptoms however is limited by a pinched nerve in her low back. She did have a cane for which she used to walk today. She does not exercise regularly. She also mentioned an episode where she had about 8 hours of difficulty recalling names and some memory loss after a traumatic event shortly after her mother died. She had an extensive workup for this including an MRI of the brain and ultimately no clear cause was found. She reports very good control of her her palpitations.  11/21/2017  [name] returns today for follow-up.  Overall she is doing well.  She denies any chest pain or worsening shortness of breath.  Previously I had added chlorthalidone to her atenolol.  She is doing well with that and reports blood pressures in the [id number] and [id number] systolic at home.  Recently she had developed a cyst on her left middle finger.  She had surgery for that this week.  She has reported some fatigue and wonders whether or not that may be related to a B12 deficiency.  There are apparently some reports that B12 deficiency can lead to cysts such as when she had on her hand.  Additionally, we discussed her cholesterol today.  In the past she has been intolerant to rosuvastatin and Vytorin.  She was  placed on pravastatin by me for elevated LDL cholesterol close to 190.  She had a marked reduction in total cholesterol on it however may have had side effects.  She discontinued it over a year ago and is not restarted.  She has no known atherosclerotic cardiovascular disease.  In 2016 she had carotid Dopplers which were negative in the setting of a TIA.  The etiology of the TIA is not clear.  03/16/2018  [name] returns today for follow-up.  She underwent coronary artery calcium scoring which was reported as 0, however she was found to have a small subcentimeter nodule which will need follow-up with a repeat CT scan in a year.  Overall she is doing well.  She was confused and got her lipid profile a month earlier than her appointment.  At lipid profile showed total cholesterol 198, triglycerides 178, HDL 48 and LDL 114.  She therefore has had improvement on pravastatin.  We discussed whether or not she actually needs to be on statin therapy, interestingly since she has a 0 calcium score.  Her ten-year risk is actually quite low however one considers her 30-year risk which is reasonable given her age in the mid 66s, would argue for statin therapy provided her diet is optimized.  She seems to be tolerating pravastatin without side effects.  05/12/2019  [name] is seen today in routine follow-up.  Overall she seems  to be doing well.  She has been taking pravastatin without any side effects.  She is noted a marked improvement in her lipid profile.  Total cholesterol is now 204, triglycerides 142, HDL 48 and LDL 128.  We had suggested a goal LDL less than 100.  Although her LDL is a little higher, this may represent recent dietary changes.  Her calcium score was 0 suggesting low risk of cardiovascular events.  Blood pressure is well controlled today.  PMHx:  Past Medical History:  Diagnosis Date  . Allergy   . Asthma   . Back pain   . Cancer ([place])    skin cancer  . Depression   . Hyperlipidemia   .  Hypertension     Past Surgical History:  Procedure Laterality Date  . bladder mesh    . COLONOSCOPY N/A 07/12/2015   Procedure: COLONOSCOPY;  Surgeon: Rogene Houston, MD;  Location: [facility];  Service: Endoscopy;  Laterality: N/A;  1030  . NM MYOCAR PERF WALL MOTION  02/2007   dipyridamole myoview; EF 70%, no wall motion abnormalities, no inducible ischemia, low risk   . OVARIAN CYST REMOVAL  1987  . TONSILECTOMY/ADENOIDECTOMY WITH MYRINGOTOMY  1958  . TONSILLECTOMY    . TRANSTHORACIC ECHOCARDIOGRAM  02/2007   RV mildly dlated; LV normal in size; mild MR with thickened MV leaflets; mild TR; mild pulm valve regurg  . VAGINAL DELIVERY     x3    FAMHx:  Family History  Problem Relation Age of Onset  . Heart disease Mother   . Kidney disease Mother   . Breast cancer Mother   . Coronary artery disease Mother        CABG at age 67  . Dementia Father   . Stroke Father   . Stroke Maternal Grandmother   . Cancer Maternal Grandfather   . Cancer Paternal Grandmother   . Kidney disease Paternal Grandfather     SOCHx:   reports that she has never smoked. She has never used smokeless tobacco. She reports that she does not drink alcohol or use drugs.  ALLERGIES:  Allergies  Allergen Reactions  . Bee Venom Swelling  . Crestor [Rosuvastatin] Other (See Comments)    Myalgias  . Erythromycin Itching, Rash and Other (See Comments)  . Latex Itching and Rash    rash  . Rosuvastatin Calcium Other (See Comments)    Myalgias  . Aleve [Naproxen Sodium] Other (See Comments)    unknown  . Naproxen Other (See Comments)    unknown  . Tetracyclines & Related Other (See Comments)    unknown  . Vytorin [Ezetimibe-Simvastatin] Other (See Comments)    unknown    ROS: Pertinent items noted in HPI and remainder of comprehensive ROS otherwise negative.  HOME MEDS: Current Outpatient Medications on File Prior to Visit  Medication Sig Dispense Refill  . albuterol (PROVENTIL HFA;VENTOLIN  HFA) 108 (90 Base) MCG/ACT inhaler Inhale 2 puffs into the lungs every 4 (four) hours as needed for wheezing or shortness of breath. 1 Inhaler 0  . allopurinol (ZYLOPRIM) 300 MG tablet Take 300 mg by mouth every evening.   3  . atenolol-chlorthalidone (TENORETIC) 50-25 MG tablet TAKE 1 TABLET BY MOUTH DAILY 90 tablet 0  . Biotin 1 MG CAPS Take by mouth.    . budesonide (PULMICORT) 180 MCG/ACT inhaler Inhale 2 puffs into the lungs 2 (two) times daily.     . cetirizine (ZYRTEC ALLERGY) 10 MG tablet Take 1 tablet by  mouth daily.    Marland Kitchen esomeprazole (NEXIUM) 20 MG capsule Take 20 mg by mouth daily at 12 noon.    . ferrous sulfate 325 (65 FE) MG EC tablet Take 325 mg by mouth daily with breakfast.     . hydrocortisone 2.5 % cream Apply 1 application topically daily.   3  . ibuprofen (ADVIL,MOTRIN) 200 MG tablet Take 400 mg by mouth every 6 (six) hours as needed for headache.    . montelukast (SINGULAIR) 10 MG tablet Take 10 mg by mouth every morning.     . Multiple Vitamin (MULTIVITAMIN) capsule Take 1 capsule by mouth daily.     . pravastatin (PRAVACHOL) 40 MG tablet TAKE 1 TABLET(40 MG) BY MOUTH DAILY 30 tablet 5  . PROAIR HFA 108 (90 BASE) MCG/ACT inhaler Inhale 2 puffs into the lungs 2 (two) times daily.   2  . sertraline (ZOLOFT) 25 MG tablet Take 25 mg by mouth daily.   1   No current facility-administered medications on file prior to visit.     LABS/IMAGING: No results found for this or any previous visit (from the past 48 hour(s)). No results found.  WEIGHTS: Wt Readings from Last 3 Encounters:  05/12/19 183 lb (83 kg)  12/20/18 170 lb (77.1 kg)  12/02/18 174 lb (78.9 kg)    VITALS: Ht 5\' 6"  (1.676 m)   Wt 183 lb (83 kg)   BMI 29.54 kg/m   EXAM: General appearance: alert and no distress Neck: no carotid bruit, no JVD and thyroid not enlarged, symmetric, no tenderness/mass/nodules Lungs: clear to auscultation bilaterally Heart: regular rate and rhythm Abdomen: soft,  non-tender; bowel sounds normal; no masses,  no organomegaly Extremities: extremities normal, atraumatic, no cyanosis or edema Pulses: 2+ and symmetric Skin: Skin color, texture, turgor normal. No rashes or lesions Neurologic: Grossly normal Psych: Pleasant  EKG: Normal sinus rhythm 61, left posterior fascicular block-personally reviewed  ASSESSMENT: 1. Dyslipidemia 2. Hypertension - uncontrolled 3. Palpitations 4. Family history of cardiovascular disease 5. 0 coronary calcium score (11/2017)  PLAN: 1.   [name] seems to be doing well on pravastatin with reduction in her LDL cholesterol.  Her target is less than 100 focus on more significant dietary changes.  She related that she might of had COVID with symptoms back in December.  Her husband was also sick.  Both of them were tested later but antibodies were not detected.  Overall, she seems to have recovered.  We will plan on continued medical therapy, dietary changes and follow-up annually or sooner as necessary.  Pixie Casino, MD, [facility], [place] Director of the Advanced Lipid Disorders &  Cardiovascular Risk Reduction Clinic Diplomate of the American Board of Clinical Lipidology Attending Cardiologist  Direct Dial: [phone]  Fax: [phone]  Website:  [email or web address] 05/12/2019, 2:59 PM

## 2019-05-12 NOTE — Patient Instructions (Addendum)
Medication Instructions:  Your physician recommends that you continue on your current medications as directed. Please refer to the Current Medication list given to you today.  If you need a refill on your cardiac medications before your next appointment, please call your pharmacy.   Lab work: FASTING lab work before your next appointment in 1 year -- lab order will be mailed If you have labs (blood work) drawn today and your tests are completely normal, you will receive your results only by: Marland Kitchen MyChart Message (if you have MyChart) OR . A paper copy in the mail If you have any lab test that is abnormal or we need to change your treatment, we will call you to review the results.  Follow-Up: At [facility], you and your health needs are our priority.  As part of our continuing mission to provide you with exceptional heart care, we have created designated Provider Care Teams.  These Care Teams include your primary Cardiologist (physician) and Advanced Practice Providers (APPs -  Physician Assistants and Nurse Practitioners) who all work together to provide you with the care you need, when you need it. You will need a follow up appointment in 12 months.  Please call our office 2 months in advance to schedule this appointment.  You may see Dr. Debara Pickett or one of the following Advanced Practice Providers on your designated Care Team: Almyra Deforest, [place] . Fabian Sharp, PA-C

## 2019-05-14 DIAGNOSIS — J4520 Mild intermittent asthma, uncomplicated: Secondary | ICD-10-CM | POA: Diagnosis not present

## 2019-05-14 DIAGNOSIS — I10 Essential (primary) hypertension: Secondary | ICD-10-CM | POA: Diagnosis not present

## 2019-05-14 DIAGNOSIS — E119 Type 2 diabetes mellitus without complications: Secondary | ICD-10-CM | POA: Diagnosis not present

## 2019-06-01 ENCOUNTER — Other Ambulatory Visit: Payer: Self-pay | Admitting: Internal Medicine

## 2019-06-10 ENCOUNTER — Other Ambulatory Visit: Payer: Self-pay | Admitting: *Deleted

## 2019-06-10 DIAGNOSIS — J4520 Mild intermittent asthma, uncomplicated: Secondary | ICD-10-CM | POA: Diagnosis not present

## 2019-06-10 DIAGNOSIS — R6889 Other general symptoms and signs: Secondary | ICD-10-CM | POA: Diagnosis not present

## 2019-06-10 DIAGNOSIS — Z20822 Contact with and (suspected) exposure to covid-19: Secondary | ICD-10-CM

## 2019-06-10 DIAGNOSIS — U071 COVID-19: Secondary | ICD-10-CM | POA: Diagnosis not present

## 2019-06-11 DIAGNOSIS — J4520 Mild intermittent asthma, uncomplicated: Secondary | ICD-10-CM | POA: Diagnosis not present

## 2019-06-11 DIAGNOSIS — U071 COVID-19: Secondary | ICD-10-CM | POA: Diagnosis not present

## 2019-06-11 LAB — NOVEL CORONAVIRUS, NAA: SARS-CoV-2, NAA: DETECTED — AB

## 2019-06-16 DIAGNOSIS — J4520 Mild intermittent asthma, uncomplicated: Secondary | ICD-10-CM | POA: Diagnosis not present

## 2019-06-16 DIAGNOSIS — E119 Type 2 diabetes mellitus without complications: Secondary | ICD-10-CM | POA: Diagnosis not present

## 2019-06-16 DIAGNOSIS — U071 COVID-19: Secondary | ICD-10-CM | POA: Diagnosis not present

## 2019-06-22 ENCOUNTER — Other Ambulatory Visit: Payer: Self-pay

## 2019-06-22 DIAGNOSIS — Z20828 Contact with and (suspected) exposure to other viral communicable diseases: Secondary | ICD-10-CM | POA: Diagnosis not present

## 2019-06-22 DIAGNOSIS — Z20822 Contact with and (suspected) exposure to covid-19: Secondary | ICD-10-CM

## 2019-06-24 LAB — NOVEL CORONAVIRUS, NAA: SARS-CoV-2, NAA: NOT DETECTED

## 2019-06-28 ENCOUNTER — Telehealth: Payer: Self-pay

## 2019-06-28 NOTE — Telephone Encounter (Signed)
Per pt. Request, faxed most recent [place] (10/6) to her employer @ [employer] @ [phone].

## 2019-07-13 ENCOUNTER — Encounter: Payer: Self-pay | Admitting: Physician Assistant

## 2019-07-13 ENCOUNTER — Other Ambulatory Visit: Payer: Self-pay

## 2019-07-13 ENCOUNTER — Ambulatory Visit: Payer: BC Managed Care – PPO | Admitting: Physician Assistant

## 2019-07-13 VITALS — BP 112/85 | HR 68 | Temp 98.2°F | Ht 66.0 in | Wt 178.8 lb

## 2019-07-13 DIAGNOSIS — E782 Mixed hyperlipidemia: Secondary | ICD-10-CM | POA: Diagnosis not present

## 2019-07-13 DIAGNOSIS — Z23 Encounter for immunization: Secondary | ICD-10-CM | POA: Diagnosis not present

## 2019-07-13 DIAGNOSIS — R7309 Other abnormal glucose: Secondary | ICD-10-CM | POA: Diagnosis not present

## 2019-07-13 DIAGNOSIS — Z0000 Encounter for general adult medical examination without abnormal findings: Secondary | ICD-10-CM

## 2019-07-13 DIAGNOSIS — I10 Essential (primary) hypertension: Secondary | ICD-10-CM | POA: Diagnosis not present

## 2019-07-13 LAB — BAYER DCA HB A1C WAIVED: HB A1C (BAYER DCA - WAIVED): 6.7 % (ref <7.0)

## 2019-07-13 NOTE — Patient Instructions (Signed)
Carbohydrate Counting for Diabetes Mellitus, Adult  Carbohydrate counting is a method of keeping track of how many carbohydrates you eat. Eating carbohydrates naturally increases the amount of sugar (glucose) in the blood. Counting how many carbohydrates you eat helps keep your blood glucose within normal limits, which helps you manage your diabetes (diabetes mellitus). It is important to know how many carbohydrates you can safely have in each meal. This is different for every person. A diet and nutrition specialist (registered dietitian) can help you make a meal plan and calculate how many carbohydrates you should have at each meal and snack. Carbohydrates are found in the following foods:  Grains, such as breads and cereals.  Dried beans and soy products.  Starchy vegetables, such as potatoes, peas, and corn.  Fruit and fruit juices.  Milk and yogurt.  Sweets and snack foods, such as cake, cookies, candy, chips, and soft drinks. How do I count carbohydrates? There are two ways to count carbohydrates in food. You can use either of the methods or a combination of both. Reading "Nutrition Facts" on packaged food The "Nutrition Facts" list is included on the labels of almost all packaged foods and beverages in the [place] It includes:  The serving size.  Information about nutrients in each serving, including the grams (g) of carbohydrate per serving. To use the "Nutrition Facts":  Decide how many servings you will have.  Multiply the number of servings by the number of carbohydrates per serving.  The resulting number is the total amount of carbohydrates that you will be having. Learning standard serving sizes of other foods When you eat carbohydrate foods that are not packaged or do not include "Nutrition Facts" on the label, you need to measure the servings in order to count the amount of carbohydrates:  Measure the foods that you will eat with a food scale or measuring cup, if needed.   Decide how many standard-size servings you will eat.  Multiply the number of servings by 15. Most carbohydrate-[place] foods have about 15 g of carbohydrates per serving. ? For example, if you eat 8 oz (170 g) of strawberries, you will have eaten 2 servings and 30 g of carbohydrates (2 servings x 15 g = 30 g).  For foods that have more than one food mixed, such as soups and casseroles, you must count the carbohydrates in each food that is included. The following list contains standard serving sizes of common carbohydrate-[place] foods. Each of these servings has about 15 g of carbohydrates:   hamburger bun or  English muffin.   oz (15 mL) syrup.   oz (14 g) jelly.  1 slice of bread.  1 six-inch tortilla.  3 oz (85 g) cooked rice or pasta.  4 oz (113 g) cooked dried beans.  4 oz (113 g) starchy vegetable, such as peas, corn, or potatoes.  4 oz (113 g) hot cereal.  4 oz (113 g) mashed potatoes or  of a large baked potato.  4 oz (113 g) canned or frozen fruit.  4 oz (120 mL) fruit juice.  4-6 crackers.  6 chicken nuggets.  6 oz (170 g) unsweetened dry cereal.  6 oz (170 g) plain fat-free yogurt or yogurt sweetened with artificial sweeteners.  8 oz (240 mL) milk.  8 oz (170 g) fresh fruit or one small piece of fruit.  24 oz (680 g) popped popcorn. Example of carbohydrate counting Sample meal  3 oz (85 g) chicken breast.  6 oz (170 g)   brown rice.  4 oz (113 g) corn.  8 oz (240 mL) milk.  8 oz (170 g) strawberries with sugar-free whipped topping. Carbohydrate calculation 1. Identify the foods that contain carbohydrates: ? Rice. ? Corn. ? Milk. ? Strawberries. 2. Calculate how many servings you have of each food: ? 2 servings rice. ? 1 serving corn. ? 1 serving milk. ? 1 serving strawberries. 3. Multiply each number of servings by 15 g: ? 2 servings rice x 15 g = 30 g. ? 1 serving corn x 15 g = 15 g. ? 1 serving milk x 15 g = 15 g. ? 1 serving  strawberries x 15 g = 15 g. 4. Add together all of the amounts to find the total grams of carbohydrates eaten: ? 30 g + 15 g + 15 g + 15 g = 75 g of carbohydrates total. Summary  Carbohydrate counting is a method of keeping track of how many carbohydrates you eat.  Eating carbohydrates naturally increases the amount of sugar (glucose) in the blood.  Counting how many carbohydrates you eat helps keep your blood glucose within normal limits, which helps you manage your diabetes.  A diet and nutrition specialist (registered dietitian) can help you make a meal plan and calculate how many carbohydrates you should have at each meal and snack. This information is not intended to replace advice given to you by your health care provider. Make sure you discuss any questions you have with your health care provider. Document Released: 09/02/2005 Document Revised: 03/27/2017 Document Reviewed: 02/14/2016 Elsevier Patient Education  2020 Elsevier Inc.  

## 2019-07-14 LAB — CMP14+EGFR
ALT: 41 IU/L — ABNORMAL HIGH (ref 0–32)
AST: 41 IU/L — ABNORMAL HIGH (ref 0–40)
Albumin/Globulin Ratio: 2.1 (ref 1.2–2.2)
Albumin: 4.8 g/dL (ref 3.8–4.8)
Alkaline Phosphatase: 83 IU/L (ref 39–117)
BUN/Creatinine Ratio: 12 (ref 12–28)
BUN: 11 mg/dL (ref 8–27)
Bilirubin Total: 0.5 mg/dL (ref 0.0–1.2)
CO2: 23 mmol/L (ref 20–29)
Calcium: 9.9 mg/dL (ref 8.7–10.3)
Chloride: 100 mmol/L (ref 96–106)
Creatinine, Ser: 0.9 mg/dL (ref 0.57–1.00)
GFR calc Af Amer: 76 mL/min/{1.73_m2} (ref >59)
GFR calc non Af Amer: 66 mL/min/{1.73_m2} (ref >59)
Globulin, Total: 2.3 g/dL (ref 1.5–4.5)
Glucose: 120 mg/dL — ABNORMAL HIGH (ref 65–99)
Potassium: 3.4 mmol/L — ABNORMAL LOW (ref 3.5–5.2)
Sodium: 143 mmol/L (ref 134–144)
Total Protein: 7.1 g/dL (ref 6.0–8.5)

## 2019-07-14 LAB — CBC WITH DIFFERENTIAL/PLATELET
Basophils Absolute: 0.1 10*3/uL (ref 0.0–0.2)
Basos: 1 %
EOS (ABSOLUTE): 0.2 10*3/uL (ref 0.0–0.4)
Eos: 2 %
Hematocrit: 40.9 % (ref 34.0–46.6)
Hemoglobin: 14.4 g/dL (ref 11.1–15.9)
Immature Grans (Abs): 0 10*3/uL (ref 0.0–0.1)
Immature Granulocytes: 1 %
Lymphocytes Absolute: 2.5 10*3/uL (ref 0.7–3.1)
Lymphs: 30 %
MCH: 29.8 pg (ref 26.6–33.0)
MCHC: 35.2 g/dL (ref 31.5–35.7)
MCV: 85 fL (ref 79–97)
Monocytes Absolute: 0.5 10*3/uL (ref 0.1–0.9)
Monocytes: 6 %
Neutrophils Absolute: 5.1 10*3/uL (ref 1.4–7.0)
Neutrophils: 60 %
Platelets: 206 10*3/uL (ref 150–450)
RBC: 4.84 x10E6/uL (ref 3.77–5.28)
RDW: 12.9 % (ref 11.7–15.4)
WBC: 8.3 10*3/uL (ref 3.4–10.8)

## 2019-07-15 ENCOUNTER — Telehealth: Payer: Self-pay | Admitting: Physician Assistant

## 2019-07-15 MED ORDER — MONTELUKAST SODIUM 10 MG PO TABS: 10.0000 mg | ORAL_TABLET | ORAL | 1 refills | Status: DC

## 2019-07-15 NOTE — Telephone Encounter (Signed)
rx filled

## 2019-07-15 NOTE — Telephone Encounter (Signed)
[name] has not filled - please advise if we can send rx.

## 2019-07-15 NOTE — Progress Notes (Signed)
BP 112/85   Pulse 68   Temp 98.2 F (36.8 C) (Temporal)   Ht 5' 6" (1.676 m)   Wt 178 lb 12.8 oz (81.1 kg)   SpO2 99%   BMI 28.86 kg/m    Subjective:    Patient ID: [name], female    DOB: 05-28-1951, 67 y.o.   MRN: [id number]  HPI: [name] is a 68 y.o. female presenting on 07/13/2019 for New Patient (Initial Visit) and Establish Care  This patient comes in to be established.  She has been a longtime patient of Dr. Luan Pulling.  Her medical illnesses do include hypertension, hyperlipidemia, history of seizures, family history of heart disease, elevated blood sugar.  We will have labs performed today.  She does need to have labs refill.  Back in September the patient had positive Covid diagnosis.  She was out of work for about 3 weeks.  She has returned now.  The most pressing thing for her now is just excessive fatigue whenever she gets home.  After about a week of being back to work she does feel like things are starting to get better.  Past Medical History:  Diagnosis Date  . Allergy   . Asthma   . Back pain   . Cancer ([place])    skin cancer  . Depression   . Hyperlipidemia   . Hypertension    Relevant past medical, surgical, family and social history reviewed and updated as indicated. Interim medical history since our last visit reviewed. Allergies and medications reviewed and updated. DATA REVIEWED: CHART IN EPIC  Family History reviewed for pertinent findings.  Review of Systems  Constitutional: Negative.  Negative for activity change, fatigue and fever.  HENT: Negative.   Eyes: Negative.   Respiratory: Negative.  Negative for cough.   Cardiovascular: Negative.  Negative for chest pain.  Gastrointestinal: Negative.  Negative for abdominal pain.  Endocrine: Negative.   Genitourinary: Negative.  Negative for dysuria.  Musculoskeletal: Negative.   Skin: Negative.   Neurological: Negative.     Allergies as of 07/13/2019      Reactions   Bee Venom  Swelling   Crestor [rosuvastatin] Other (See Comments)   Myalgias   Erythromycin Itching, Rash, Other (See Comments)   Latex Itching, Rash   rash   Rosuvastatin Calcium Other (See Comments)   Myalgias   Aleve [naproxen Sodium] Other (See Comments)   unknown   Naproxen Other (See Comments)   unknown   Tetracyclines & Related Other (See Comments)   unknown   Vytorin [ezetimibe-simvastatin] Other (See Comments)   unknown      Medication List       Accurate as of July 13, 2019 11:59 PM. If you have any questions, ask your nurse or doctor.        STOP taking these medications   hydrocortisone 2.5 % cream Stopped by: Terald Sleeper, PA-C   ibuprofen 200 MG tablet Commonly known as: ADVIL Stopped by: Terald Sleeper, PA-C     TAKE these medications   albuterol 108 (90 Base) MCG/ACT inhaler Commonly known as: VENTOLIN HFA Inhale 2 puffs into the lungs every 4 (four) hours as needed for wheezing or shortness of breath. What changed: Another medication with the same name was removed. Continue taking this medication, and follow the directions you see here. Changed by: Terald Sleeper, PA-C   allopurinol 300 MG tablet Commonly known as: ZYLOPRIM Take 300 mg by mouth every evening.  atenolol-chlorthalidone 50-25 MG tablet Commonly known as: TENORETIC TAKE 1 TABLET BY MOUTH DAILY   Biotin 1 MG Caps Take by mouth.   budesonide 180 MCG/ACT inhaler Commonly known as: PULMICORT Inhale 2 puffs into the lungs 2 (two) times daily.   cholecalciferol 25 MCG (1000 UT) tablet Commonly known as: VITAMIN D3 Take 1,000 Units by mouth daily.   esomeprazole 20 MG capsule Commonly known as: NEXIUM Take 20 mg by mouth daily at 12 noon.   ferrous sulfate 325 (65 FE) MG EC tablet Take 325 mg by mouth daily with breakfast.   montelukast 10 MG tablet Commonly known as: SINGULAIR Take 10 mg by mouth every morning.   multivitamin capsule Take 1 capsule by mouth daily.   pravastatin  40 MG tablet Commonly known as: PRAVACHOL TAKE 1 TABLET(40 MG) BY MOUTH DAILY   sertraline 25 MG tablet Commonly known as: ZOLOFT Take 25 mg by mouth daily.   ZyrTEC Allergy 10 MG tablet Generic drug: cetirizine Take 1 tablet by mouth daily.          Objective:    BP 112/85   Pulse 68   Temp 98.2 F (36.8 C) (Temporal)   Ht 5' 6" (1.676 m)   Wt 178 lb 12.8 oz (81.1 kg)   SpO2 99%   BMI 28.86 kg/m   Allergies  Allergen Reactions  . Bee Venom Swelling  . Crestor [Rosuvastatin] Other (See Comments)    Myalgias  . Erythromycin Itching, Rash and Other (See Comments)  . Latex Itching and Rash    rash  . Rosuvastatin Calcium Other (See Comments)    Myalgias  . Aleve [Naproxen Sodium] Other (See Comments)    unknown  . Naproxen Other (See Comments)    unknown  . Tetracyclines & Related Other (See Comments)    unknown  . Vytorin [Ezetimibe-Simvastatin] Other (See Comments)    unknown    Wt Readings from Last 3 Encounters:  07/13/19 178 lb 12.8 oz (81.1 kg)  05/12/19 183 lb (83 kg)  12/20/18 170 lb (77.1 kg)    Physical Exam Constitutional:      Appearance: She is well-developed.  HENT:     Head: Normocephalic and atraumatic.     Right Ear: Tympanic membrane, ear canal and external ear normal.     Left Ear: Tympanic membrane, ear canal and external ear normal.     Nose: Nose normal. No rhinorrhea.     Mouth/Throat:     Pharynx: No oropharyngeal exudate or posterior oropharyngeal erythema.  Eyes:     Conjunctiva/sclera: Conjunctivae normal.     Pupils: Pupils are equal, round, and reactive to light.  Neck:     Musculoskeletal: Normal range of motion and neck supple.  Cardiovascular:     Rate and Rhythm: Normal rate and regular rhythm.     Heart sounds: Normal heart sounds.  Pulmonary:     Effort: Pulmonary effort is normal.     Breath sounds: Normal breath sounds.  Abdominal:     General: Bowel sounds are normal.     Palpations: Abdomen is soft.  Skin:     General: Skin is warm and dry.     Findings: No rash.  Neurological:     Mental Status: She is alert and oriented to person, place, and time.     Deep Tendon Reflexes: Reflexes are normal and symmetric.  Psychiatric:        Behavior: Behavior normal.        Thought  Content: Thought content normal.        Judgment: Judgment normal.     Results for orders placed or performed in visit on 07/13/19  CBC with Differential/Platelet  Result Value Ref Range   WBC 8.3 3.4 - 10.8 x10E3/uL   RBC 4.84 3.77 - 5.28 x10E6/uL   Hemoglobin 14.4 11.1 - 15.9 g/dL   Hematocrit 40.9 34.0 - 46.6 %   MCV 85 79 - 97 fL   MCH 29.8 26.6 - 33.0 pg   MCHC 35.2 31.5 - 35.7 g/dL   RDW 12.9 11.7 - 15.4 %   Platelets 206 150 - 450 x10E3/uL   Neutrophils 60 Not Estab. %   Lymphs 30 Not Estab. %   Monocytes 6 Not Estab. %   Eos 2 Not Estab. %   Basos 1 Not Estab. %   Neutrophils Absolute 5.1 1.4 - 7.0 x10E3/uL   Lymphocytes Absolute 2.5 0.7 - 3.1 x10E3/uL   Monocytes Absolute 0.5 0.1 - 0.9 x10E3/uL   EOS (ABSOLUTE) 0.2 0.0 - 0.4 x10E3/uL   Basophils Absolute 0.1 0.0 - 0.2 x10E3/uL   Immature Granulocytes 1 Not Estab. %   Immature Grans (Abs) 0.0 0.0 - 0.1 x10E3/uL  CMP14+EGFR  Result Value Ref Range   Glucose 120 (H) 65 - 99 mg/dL   BUN 11 8 - 27 mg/dL   Creatinine, Ser 0.90 0.57 - 1.00 mg/dL   GFR calc non Af Amer 66 >59 mL/min/1.73   GFR calc Af Amer 76 >59 mL/min/1.73   BUN/Creatinine Ratio 12 12 - 28   Sodium 143 134 - 144 mmol/L   Potassium 3.4 (L) 3.5 - 5.2 mmol/L   Chloride 100 96 - 106 mmol/L   CO2 23 20 - 29 mmol/L   Calcium 9.9 8.7 - 10.3 mg/dL   Total Protein 7.1 6.0 - 8.5 g/dL   Albumin 4.8 3.8 - 4.8 g/dL   Globulin, Total 2.3 1.5 - 4.5 g/dL   Albumin/Globulin Ratio 2.1 1.2 - 2.2   Bilirubin Total 0.5 0.0 - 1.2 mg/dL   Alkaline Phosphatase 83 39 - 117 IU/L   AST 41 (H) 0 - 40 IU/L   ALT 41 (H) 0 - 32 IU/L  Bayer DCA Hb A1c Waived  Result Value Ref Range   HB A1C (BAYER DCA -  WAIVED) 6.7 <7.0 %      Assessment & Plan:   1. Elevated glucose - CBC with Differential/Platelet - CMP14+EGFR - Bayer DCA Hb A1c Waived  2. Well adult exam - CBC with Differential/Platelet - CMP14+EGFR - Bayer DCA Hb A1c Waived  3. Essential hypertension - CBC with Differential/Platelet - CMP14+EGFR  4. Mixed hyperlipidemia - CBC with Differential/Platelet - CMP14+EGFR   Continue all other maintenance medications as listed above.  Follow up plan: Return for late Jan for triage nurse, vaccine visit.  Educational handout given for Derma PA-C [place] [address]  [place], [place] [address] [phone]   07/15/2019, 9:08 PM

## 2019-07-15 NOTE — Telephone Encounter (Signed)
Line busy x 2

## 2019-07-19 ENCOUNTER — Other Ambulatory Visit: Payer: Self-pay | Admitting: Physician Assistant

## 2019-07-20 ENCOUNTER — Other Ambulatory Visit: Payer: Self-pay | Admitting: *Deleted

## 2019-07-20 MED ORDER — ALLOPURINOL 300 MG PO TABS: 300.0000 mg | ORAL_TABLET | Freq: Every evening | ORAL | 3 refills | Status: DC

## 2019-10-13 ENCOUNTER — Other Ambulatory Visit: Payer: Self-pay

## 2019-10-14 ENCOUNTER — Ambulatory Visit (INDEPENDENT_AMBULATORY_CARE_PROVIDER_SITE_OTHER): Payer: BC Managed Care – PPO | Admitting: Physician Assistant

## 2019-10-14 ENCOUNTER — Ambulatory Visit: Payer: BC Managed Care – PPO

## 2019-10-14 ENCOUNTER — Encounter: Payer: Self-pay | Admitting: Physician Assistant

## 2019-10-14 VITALS — BP 126/81 | HR 61 | Temp 96.5°F | Ht 66.0 in | Wt 183.4 lb

## 2019-10-14 DIAGNOSIS — Z23 Encounter for immunization: Secondary | ICD-10-CM | POA: Diagnosis not present

## 2019-10-14 DIAGNOSIS — J4540 Moderate persistent asthma, uncomplicated: Secondary | ICD-10-CM | POA: Diagnosis not present

## 2019-10-14 DIAGNOSIS — I10 Essential (primary) hypertension: Secondary | ICD-10-CM

## 2019-10-14 DIAGNOSIS — E782 Mixed hyperlipidemia: Secondary | ICD-10-CM

## 2019-10-18 ENCOUNTER — Encounter: Payer: Self-pay | Admitting: Physician Assistant

## 2019-10-18 DIAGNOSIS — J45909 Unspecified asthma, uncomplicated: Secondary | ICD-10-CM | POA: Insufficient documentation

## 2019-10-18 NOTE — Progress Notes (Signed)
Acute Office Visit  Subjective:    Patient ID: [name], female    DOB: [birth year], 69 y.o.   MRN: [id number]  Chief Complaint  Patient presents with  . Medical Management of Chronic Issues    3 month    For chronic medical conditions.  They do include asthma, hypertension, GERD, hyperlipidemia, seizures.  Overall she states that she has been very stable the last few months.  She does not have any new complaints.  She did have Covid last fall and does feel much better from that.  She is planning to get her vaccine as soon as possible.  She does need to have labs performed and we will get an order placed for her to come and have this performed.  Refills do need to be sent.  Hypertension This is a chronic problem. The current episode started today. The problem is controlled. Pertinent negatives include no chest pain or shortness of breath. There are no associated agents to hypertension. Past treatments include beta blockers and diuretics. The current treatment provides significant improvement. There are no compliance problems.   Asthma She complains of cough and wheezing. There is no chest tightness, hemoptysis or shortness of breath. This is a chronic problem. The current episode started 1 to 4 weeks ago. The problem occurs intermittently. The problem has been waxing and waning. The cough is non-productive. Pertinent negatives include no chest pain or fever. Her symptoms are alleviated by beta-agonist and steroid inhaler. She reports significant improvement on treatment. Her past medical history is significant for asthma.  Hyperlipidemia This is a chronic problem. The current episode started more than 1 year ago. The problem is controlled. Pertinent negatives include no chest pain or shortness of breath. Current antihyperlipidemic treatment includes statins. The current treatment provides significant improvement of lipids.     Past Medical History:  Diagnosis Date  . Allergy   .  Asthma   . Back pain   . Cancer ([place])    skin cancer  . Depression   . Hyperlipidemia     Past Surgical History:  Procedure Laterality Date  . bladder mesh    . COLONOSCOPY N/A 07/12/2015   Procedure: COLONOSCOPY;  Surgeon: Rogene Houston, MD;  Location: [facility];  Service: Endoscopy;  Laterality: N/A;  1030  . NM MYOCAR PERF WALL MOTION  02/2007   dipyridamole myoview; EF 70%, no wall motion abnormalities, no inducible ischemia, low risk   . OVARIAN CYST REMOVAL  1987  . TONSILECTOMY/ADENOIDECTOMY WITH MYRINGOTOMY  1958  . TONSILLECTOMY    . TRANSTHORACIC ECHOCARDIOGRAM  02/2007   RV mildly dlated; LV normal in size; mild MR with thickened MV leaflets; mild TR; mild pulm valve regurg  . VAGINAL DELIVERY     x3    Family History  Problem Relation Age of Onset  . Heart disease Mother   . Kidney disease Mother   . Breast cancer Mother   . Coronary artery disease Mother        CABG at age 34  . Dementia Father   . Stroke Father   . Stroke Maternal Grandmother   . Cancer Maternal Grandfather   . Cancer Paternal Grandmother   . Kidney disease Paternal Grandfather     Social History   Socioeconomic History  . Marital status: Married    Spouse name: Not on file  . Number of children: Not on file  . Years of education: Not on file  . Highest  education level: Not on file  Occupational History  . Not on file  Tobacco Use  . Smoking status: Never Smoker  . Smokeless tobacco: Never Used  Substance and Sexual Activity  . Alcohol use: No  . Drug use: No  . Sexual activity: Not on file  Other Topics Concern  . Not on file  Social History Narrative  . Not on file   Social Determinants of Health   Financial Resource Strain:   . Difficulty of Paying Living Expenses: Not on file  Food Insecurity:   . Worried About Charity fundraiser in the Last Year: Not on file  . Ran Out of Food in the Last Year: Not on file  Transportation Needs:   . Lack of Transportation  (Medical): Not on file  . Lack of Transportation (Non-Medical): Not on file  Physical Activity:   . Days of Exercise per Week: Not on file  . Minutes of Exercise per Session: Not on file  Stress:   . Feeling of Stress : Not on file  Social Connections:   . Frequency of Communication with Friends and Family: Not on file  . Frequency of Social Gatherings with Friends and Family: Not on file  . Attends Religious Services: Not on file  . Active Member of Clubs or Organizations: Not on file  . Attends Archivist Meetings: Not on file  . Marital Status: Not on file  Intimate Partner Violence:   . Fear of Current or Ex-Partner: Not on file  . Emotionally Abused: Not on file  . Physically Abused: Not on file  . Sexually Abused: Not on file    Outpatient Medications Prior to Visit  Medication Sig Dispense Refill  . albuterol (PROVENTIL HFA;VENTOLIN HFA) 108 (90 Base) MCG/ACT inhaler Inhale 2 puffs into the lungs every 4 (four) hours as needed for wheezing or shortness of breath. 1 Inhaler 0  . allopurinol (ZYLOPRIM) 300 MG tablet Take 1 tablet (300 mg total) by mouth every evening. 90 tablet 3  . atenolol-chlorthalidone (TENORETIC) 50-25 MG tablet TAKE 1 TABLET BY MOUTH DAILY 90 tablet 1  . Biotin 1 MG CAPS Take by mouth.    . budesonide (PULMICORT) 180 MCG/ACT inhaler Inhale 2 puffs into the lungs 2 (two) times daily.     . cetirizine (ZYRTEC ALLERGY) 10 MG tablet Take 1 tablet by mouth daily.    . cholecalciferol (VITAMIN D3) 25 MCG (1000 UT) tablet Take 1,000 Units by mouth daily.    Marland Kitchen esomeprazole (NEXIUM) 20 MG capsule Take 20 mg by mouth daily at 12 noon.    . ferrous sulfate 325 (65 FE) MG EC tablet Take 325 mg by mouth daily with breakfast.     . montelukast (SINGULAIR) 10 MG tablet Take 1 tablet (10 mg total) by mouth every morning. 90 tablet 1  . Multiple Vitamin (MULTIVITAMIN) capsule Take 1 capsule by mouth daily.     . pravastatin (PRAVACHOL) 40 MG tablet TAKE 1  TABLET(40 MG) BY MOUTH DAILY 30 tablet 6  . sertraline (ZOLOFT) 25 MG tablet Take 25 mg by mouth daily.   1   No facility-administered medications prior to visit.    Allergies  Allergen Reactions  . Bee Venom Swelling  . Crestor [Rosuvastatin] Other (See Comments)    Myalgias  . Erythromycin Itching, Rash and Other (See Comments)  . Latex Itching and Rash    rash  . Rosuvastatin Calcium Other (See Comments)    Myalgias  . Aleve [  Naproxen Sodium] Other (See Comments)    unknown  . Naproxen Other (See Comments)    unknown  . Tetracyclines & Related Other (See Comments)    unknown  . Vytorin [Ezetimibe-Simvastatin] Other (See Comments)    unknown    Review of Systems  Constitutional: Negative.  Negative for activity change, fatigue and fever.  HENT: Negative.   Eyes: Negative.   Respiratory: Positive for cough and wheezing. Negative for hemoptysis and shortness of breath.   Cardiovascular: Negative.  Negative for chest pain.  Gastrointestinal: Negative.  Negative for abdominal pain.  Endocrine: Negative.   Genitourinary: Negative.  Negative for dysuria.  Musculoskeletal: Negative.   Skin: Negative.   Neurological: Negative.        Objective:    Physical Exam Constitutional:      General: She is not in acute distress.    Appearance: Normal appearance. She is well-developed.  HENT:     Head: Normocephalic and atraumatic.  Cardiovascular:     Rate and Rhythm: Normal rate.  Pulmonary:     Effort: Pulmonary effort is normal.  Skin:    General: Skin is warm and dry.     Findings: No rash.  Neurological:     Mental Status: She is alert and oriented to person, place, and time.     Deep Tendon Reflexes: Reflexes are normal and symmetric.     BP 126/81   Pulse 61   Temp (!) 96.5 F (35.8 C) (Temporal)   Ht 5\' 6"  (1.676 m)   Wt 183 lb 6.4 oz (83.2 kg)   SpO2 99%   BMI 29.60 kg/m  Wt Readings from Last 3 Encounters:  10/14/19 183 lb 6.4 oz (83.2 kg)  07/13/19  178 lb 12.8 oz (81.1 kg)  05/12/19 183 lb (83 kg)    Health Maintenance Due  Topic Date Due  . Hepatitis C Screening  08/26/1951  . TETANUS/TDAP  04/25/1970  . MAMMOGRAM  06/01/2010  . DEXA SCAN  04/25/2016    There are no preventive care reminders to display for this patient.   Lab Results  Component Value Date   TSH 2.410 12/09/2014   Lab Results  Component Value Date   WBC 8.3 07/13/2019   HGB 14.4 07/13/2019   HCT 40.9 07/13/2019   MCV 85 07/13/2019   PLT 206 07/13/2019   Lab Results  Component Value Date   NA 143 07/13/2019   K 3.4 (L) 07/13/2019   CO2 23 07/13/2019   GLUCOSE 120 (H) 07/13/2019   BUN 11 07/13/2019   CREATININE 0.90 07/13/2019   BILITOT 0.5 07/13/2019   ALKPHOS 83 07/13/2019   AST 41 (H) 07/13/2019   ALT 41 (H) 07/13/2019   PROT 7.1 07/13/2019   ALBUMIN 4.8 07/13/2019   CALCIUM 9.9 07/13/2019   ANIONGAP 14 08/16/2018   Lab Results  Component Value Date   CHOL 204 (H) 05/10/2019   Lab Results  Component Value Date   HDL 48 05/10/2019   Lab Results  Component Value Date   LDLCALC 128 (H) 05/10/2019   Lab Results  Component Value Date   TRIG 142 05/10/2019   Lab Results  Component Value Date   CHOLHDL 4.3 05/10/2019   Lab Results  Component Value Date   HGBA1C 6.7 07/13/2019       Assessment & Plan:   1. Essential hypertension Continue Tenoretic 50-25 mg   2. Moderate persistent asthma without complication Continue albuterol as needed Continue Symbicort Continue Singulair  3.  Mixed hyperlipidemia Continue pravastatin 40 mg 1 daily   No orders of the defined types were placed in this encounter.  Terald Sleeper PA-C [facility] [address]  [place], [place] [address] [place], PA-C

## 2019-10-25 DIAGNOSIS — M9901 Segmental and somatic dysfunction of cervical region: Secondary | ICD-10-CM | POA: Diagnosis not present

## 2019-10-25 DIAGNOSIS — M9903 Segmental and somatic dysfunction of lumbar region: Secondary | ICD-10-CM | POA: Diagnosis not present

## 2019-10-25 DIAGNOSIS — M9902 Segmental and somatic dysfunction of thoracic region: Secondary | ICD-10-CM | POA: Diagnosis not present

## 2019-10-25 DIAGNOSIS — M62830 Muscle spasm of back: Secondary | ICD-10-CM | POA: Diagnosis not present

## 2019-10-28 DIAGNOSIS — M62830 Muscle spasm of back: Secondary | ICD-10-CM | POA: Diagnosis not present

## 2019-10-28 DIAGNOSIS — M9903 Segmental and somatic dysfunction of lumbar region: Secondary | ICD-10-CM | POA: Diagnosis not present

## 2019-10-28 DIAGNOSIS — M9901 Segmental and somatic dysfunction of cervical region: Secondary | ICD-10-CM | POA: Diagnosis not present

## 2019-10-28 DIAGNOSIS — M9902 Segmental and somatic dysfunction of thoracic region: Secondary | ICD-10-CM | POA: Diagnosis not present

## 2019-11-03 DIAGNOSIS — M62830 Muscle spasm of back: Secondary | ICD-10-CM | POA: Diagnosis not present

## 2019-11-03 DIAGNOSIS — M9903 Segmental and somatic dysfunction of lumbar region: Secondary | ICD-10-CM | POA: Diagnosis not present

## 2019-11-03 DIAGNOSIS — M9901 Segmental and somatic dysfunction of cervical region: Secondary | ICD-10-CM | POA: Diagnosis not present

## 2019-11-03 DIAGNOSIS — M9902 Segmental and somatic dysfunction of thoracic region: Secondary | ICD-10-CM | POA: Diagnosis not present

## 2019-11-08 DIAGNOSIS — M62830 Muscle spasm of back: Secondary | ICD-10-CM | POA: Diagnosis not present

## 2019-11-08 DIAGNOSIS — M9903 Segmental and somatic dysfunction of lumbar region: Secondary | ICD-10-CM | POA: Diagnosis not present

## 2019-11-08 DIAGNOSIS — M9901 Segmental and somatic dysfunction of cervical region: Secondary | ICD-10-CM | POA: Diagnosis not present

## 2019-11-08 DIAGNOSIS — M9902 Segmental and somatic dysfunction of thoracic region: Secondary | ICD-10-CM | POA: Diagnosis not present

## 2019-11-10 DIAGNOSIS — M9903 Segmental and somatic dysfunction of lumbar region: Secondary | ICD-10-CM | POA: Diagnosis not present

## 2019-11-10 DIAGNOSIS — M62830 Muscle spasm of back: Secondary | ICD-10-CM | POA: Diagnosis not present

## 2019-11-10 DIAGNOSIS — M9901 Segmental and somatic dysfunction of cervical region: Secondary | ICD-10-CM | POA: Diagnosis not present

## 2019-11-10 DIAGNOSIS — M9902 Segmental and somatic dysfunction of thoracic region: Secondary | ICD-10-CM | POA: Diagnosis not present

## 2019-11-11 DIAGNOSIS — M9902 Segmental and somatic dysfunction of thoracic region: Secondary | ICD-10-CM | POA: Diagnosis not present

## 2019-11-11 DIAGNOSIS — M9903 Segmental and somatic dysfunction of lumbar region: Secondary | ICD-10-CM | POA: Diagnosis not present

## 2019-11-11 DIAGNOSIS — Z23 Encounter for immunization: Secondary | ICD-10-CM | POA: Diagnosis not present

## 2019-11-11 DIAGNOSIS — M62830 Muscle spasm of back: Secondary | ICD-10-CM | POA: Diagnosis not present

## 2019-11-11 DIAGNOSIS — M9901 Segmental and somatic dysfunction of cervical region: Secondary | ICD-10-CM | POA: Diagnosis not present

## 2019-11-15 DIAGNOSIS — L309 Dermatitis, unspecified: Secondary | ICD-10-CM | POA: Diagnosis not present

## 2019-11-15 DIAGNOSIS — M62830 Muscle spasm of back: Secondary | ICD-10-CM | POA: Diagnosis not present

## 2019-11-15 DIAGNOSIS — M9902 Segmental and somatic dysfunction of thoracic region: Secondary | ICD-10-CM | POA: Diagnosis not present

## 2019-11-15 DIAGNOSIS — M9903 Segmental and somatic dysfunction of lumbar region: Secondary | ICD-10-CM | POA: Diagnosis not present

## 2019-11-15 DIAGNOSIS — M9901 Segmental and somatic dysfunction of cervical region: Secondary | ICD-10-CM | POA: Diagnosis not present

## 2019-11-15 DIAGNOSIS — L570 Actinic keratosis: Secondary | ICD-10-CM | POA: Diagnosis not present

## 2019-11-24 DIAGNOSIS — M9903 Segmental and somatic dysfunction of lumbar region: Secondary | ICD-10-CM | POA: Diagnosis not present

## 2019-11-24 DIAGNOSIS — M62830 Muscle spasm of back: Secondary | ICD-10-CM | POA: Diagnosis not present

## 2019-11-24 DIAGNOSIS — M9901 Segmental and somatic dysfunction of cervical region: Secondary | ICD-10-CM | POA: Diagnosis not present

## 2019-11-24 DIAGNOSIS — M9902 Segmental and somatic dysfunction of thoracic region: Secondary | ICD-10-CM | POA: Diagnosis not present

## 2019-11-25 DIAGNOSIS — M62830 Muscle spasm of back: Secondary | ICD-10-CM | POA: Diagnosis not present

## 2019-11-25 DIAGNOSIS — M9903 Segmental and somatic dysfunction of lumbar region: Secondary | ICD-10-CM | POA: Diagnosis not present

## 2019-11-25 DIAGNOSIS — M9901 Segmental and somatic dysfunction of cervical region: Secondary | ICD-10-CM | POA: Diagnosis not present

## 2019-11-25 DIAGNOSIS — M9902 Segmental and somatic dysfunction of thoracic region: Secondary | ICD-10-CM | POA: Diagnosis not present

## 2019-11-29 DIAGNOSIS — M9903 Segmental and somatic dysfunction of lumbar region: Secondary | ICD-10-CM | POA: Diagnosis not present

## 2019-11-29 DIAGNOSIS — M62830 Muscle spasm of back: Secondary | ICD-10-CM | POA: Diagnosis not present

## 2019-11-29 DIAGNOSIS — M9902 Segmental and somatic dysfunction of thoracic region: Secondary | ICD-10-CM | POA: Diagnosis not present

## 2019-11-29 DIAGNOSIS — M9901 Segmental and somatic dysfunction of cervical region: Secondary | ICD-10-CM | POA: Diagnosis not present

## 2019-12-02 DIAGNOSIS — M9901 Segmental and somatic dysfunction of cervical region: Secondary | ICD-10-CM | POA: Diagnosis not present

## 2019-12-02 DIAGNOSIS — M9902 Segmental and somatic dysfunction of thoracic region: Secondary | ICD-10-CM | POA: Diagnosis not present

## 2019-12-02 DIAGNOSIS — M9903 Segmental and somatic dysfunction of lumbar region: Secondary | ICD-10-CM | POA: Diagnosis not present

## 2019-12-02 DIAGNOSIS — M62830 Muscle spasm of back: Secondary | ICD-10-CM | POA: Diagnosis not present

## 2019-12-06 DIAGNOSIS — M9901 Segmental and somatic dysfunction of cervical region: Secondary | ICD-10-CM | POA: Diagnosis not present

## 2019-12-06 DIAGNOSIS — M9903 Segmental and somatic dysfunction of lumbar region: Secondary | ICD-10-CM | POA: Diagnosis not present

## 2019-12-06 DIAGNOSIS — M9902 Segmental and somatic dysfunction of thoracic region: Secondary | ICD-10-CM | POA: Diagnosis not present

## 2019-12-06 DIAGNOSIS — M62830 Muscle spasm of back: Secondary | ICD-10-CM | POA: Diagnosis not present

## 2019-12-09 DIAGNOSIS — M62830 Muscle spasm of back: Secondary | ICD-10-CM | POA: Diagnosis not present

## 2019-12-09 DIAGNOSIS — M9902 Segmental and somatic dysfunction of thoracic region: Secondary | ICD-10-CM | POA: Diagnosis not present

## 2019-12-09 DIAGNOSIS — M9903 Segmental and somatic dysfunction of lumbar region: Secondary | ICD-10-CM | POA: Diagnosis not present

## 2019-12-09 DIAGNOSIS — M9901 Segmental and somatic dysfunction of cervical region: Secondary | ICD-10-CM | POA: Diagnosis not present

## 2019-12-10 DIAGNOSIS — Z23 Encounter for immunization: Secondary | ICD-10-CM | POA: Diagnosis not present

## 2020-01-04 ENCOUNTER — Other Ambulatory Visit: Payer: Self-pay | Admitting: Internal Medicine

## 2020-01-04 ENCOUNTER — Other Ambulatory Visit: Payer: Self-pay | Admitting: Nurse Practitioner

## 2020-01-14 ENCOUNTER — Ambulatory Visit: Payer: BC Managed Care – PPO | Admitting: Physician Assistant

## 2020-01-14 ENCOUNTER — Other Ambulatory Visit: Payer: Self-pay

## 2020-01-14 ENCOUNTER — Ambulatory Visit (INDEPENDENT_AMBULATORY_CARE_PROVIDER_SITE_OTHER): Payer: BC Managed Care – PPO

## 2020-01-14 DIAGNOSIS — Z23 Encounter for immunization: Secondary | ICD-10-CM | POA: Diagnosis not present

## 2020-01-20 ENCOUNTER — Other Ambulatory Visit: Payer: Self-pay | Admitting: *Deleted

## 2020-01-20 MED ORDER — ATENOLOL-CHLORTHALIDONE 50-25 MG PO TABS: 1.0000 | ORAL_TABLET | Freq: Every day | ORAL | 0 refills | Status: DC

## 2020-02-08 ENCOUNTER — Other Ambulatory Visit: Payer: Self-pay

## 2020-02-08 ENCOUNTER — Ambulatory Visit: Payer: BC Managed Care – PPO | Admitting: Family Medicine

## 2020-02-08 ENCOUNTER — Telehealth: Payer: Self-pay | Admitting: *Deleted

## 2020-02-08 ENCOUNTER — Encounter: Payer: Self-pay | Admitting: Family Medicine

## 2020-02-08 VITALS — BP 118/70 | HR 63 | Temp 97.1°F | Ht 66.0 in | Wt 176.2 lb

## 2020-02-08 DIAGNOSIS — M1A9XX0 Chronic gout, unspecified, without tophus (tophi): Secondary | ICD-10-CM

## 2020-02-08 DIAGNOSIS — Z23 Encounter for immunization: Secondary | ICD-10-CM | POA: Diagnosis not present

## 2020-02-08 DIAGNOSIS — R7309 Other abnormal glucose: Secondary | ICD-10-CM | POA: Diagnosis not present

## 2020-02-08 DIAGNOSIS — E782 Mixed hyperlipidemia: Secondary | ICD-10-CM

## 2020-02-08 DIAGNOSIS — K219 Gastro-esophageal reflux disease without esophagitis: Secondary | ICD-10-CM | POA: Diagnosis not present

## 2020-02-08 DIAGNOSIS — H938X3 Other specified disorders of ear, bilateral: Secondary | ICD-10-CM

## 2020-02-08 DIAGNOSIS — M170 Bilateral primary osteoarthritis of knee: Secondary | ICD-10-CM

## 2020-02-08 DIAGNOSIS — I10 Essential (primary) hypertension: Secondary | ICD-10-CM | POA: Diagnosis not present

## 2020-02-08 DIAGNOSIS — Z7689 Persons encountering health services in other specified circumstances: Secondary | ICD-10-CM

## 2020-02-08 MED ORDER — DICLOFENAC SODIUM 1 % EX GEL: 4.0000 g | Freq: Four times a day (QID) | CUTANEOUS | 3 refills | Status: DC

## 2020-02-08 NOTE — Progress Notes (Signed)
 Subjective: CC: est care, HTN, HLD, elevated BGs PCP: Gottschalk, Ashly M, DO HPI:[name] is a 68 y.o. female presenting to clinic today for:  1. HTN, HLD Compliant with atenolol-chlorthalidone 50-25 mg daily, pravastatin.  No chest pain, shortness of breath.  She is followed by Dr. Hilty as well for hyperlipidemia and blood pressure.  2. Elevated BGs Patient had a hemoglobin A1c in October that was 6.7.  She has really cut back on her food and has increased physical exercise.  Of note, when this lab was obtained she had been acutely ill and treated with steroids.  She does have a family history of type 2 diabetes.  No visual disturbance.  Does not report polydipsia, polyuria.    3. Ear fullness Patient notes fullness on left. No pain.  She is compliant with singulair, frequently drinks water.  4. GERD Good control with Nexium no hematochezia, melena, nausea, vomiting.  No history of GI bleed or peptic ulcer.  She has had history of gastritis with use of oral NSAIDs.  5.  Bilateral knee pain Patient reports bilateral knee pain that seems to be worse when getting up from a seated position.  She has known OA in bilateral knees.  Does not report any falls.  Again oral NSAIDs are not usable in this patient due to intolerance.   ROS: Per HPI  Allergies  Allergen Reactions  . Bee Venom Swelling  . Crestor [Rosuvastatin] Other (See Comments)    Myalgias  . Erythromycin Itching, Rash and Other (See Comments)  . Latex Itching and Rash    rash  . Rosuvastatin Calcium Other (See Comments)    Myalgias  . Aleve [Naproxen Sodium] Other (See Comments)    unknown  . Naproxen Other (See Comments)    unknown  . Tetracyclines & Related Other (See Comments)    unknown  . Vytorin [Ezetimibe-Simvastatin] Other (See Comments)    unknown   Past Medical History:  Diagnosis Date  . Allergy   . Asthma   . Back pain   . Cancer ([facility])    skin cancer  . Depression   . Hyperlipidemia     Current Outpatient Medications:  .  albuterol (PROVENTIL HFA;VENTOLIN HFA) 108 (90 Base) MCG/ACT inhaler, Inhale 2 puffs into the lungs every 4 (four) hours as needed for wheezing or shortness of breath., Disp: 1 Inhaler, Rfl: 0 .  allopurinol (ZYLOPRIM) 300 MG tablet, Take 1 tablet (300 mg total) by mouth every evening., Disp: 90 tablet, Rfl: 3 .  atenolol-chlorthalidone (TENORETIC) 50-25 MG tablet, Take 1 tablet by mouth daily., Disp: 90 tablet, Rfl: 0 .  Biotin 1 MG CAPS, Take by mouth., Disp: , Rfl:  .  budesonide (PULMICORT) 180 MCG/ACT inhaler, Inhale 2 puffs into the lungs 2 (two) times daily. , Disp: , Rfl:  .  cetirizine (ZYRTEC ALLERGY) 10 MG tablet, Take 1 tablet by mouth daily., Disp: , Rfl:  .  cholecalciferol (VITAMIN D3) 25 MCG (1000 UT) tablet, Take 1,000 Units by mouth daily., Disp: , Rfl:  .  esomeprazole (NEXIUM) 20 MG capsule, Take 20 mg by mouth daily at 12 noon., Disp: , Rfl:  .  ferrous sulfate 325 (65 FE) MG EC tablet, Take 325 mg by mouth daily with breakfast. , Disp: , Rfl:  .  montelukast (SINGULAIR) 10 MG tablet, TAKE 1 TABLET(10 MG) BY MOUTH EVERY MORNING, Disp: 90 tablet, Rfl: 1 .  Multiple Vitamin (MULTIVITAMIN) capsule, Take 1 capsule by mouth daily. , Disp: ,   Rfl:  .  pravastatin (PRAVACHOL) 40 MG tablet, TAKE 1 TABLET(40 MG) BY MOUTH DAILY, Disp: 90 tablet, Rfl: 0 .  sertraline (ZOLOFT) 25 MG tablet, Take 25 mg by mouth daily. , Disp: , Rfl: 1 Social History   Socioeconomic History  . Marital status: Married    Spouse name: Not on file  . Number of children: Not on file  . Years of education: Not on file  . Highest education level: Not on file  Occupational History  . Not on file  Tobacco Use  . Smoking status: Never Smoker  . Smokeless tobacco: Never Used  Substance and Sexual Activity  . Alcohol use: No  . Drug use: No  . Sexual activity: Not on file  Other Topics Concern  . Not on file  Social History Narrative  . Not on file   Social  Determinants of Health   Financial Resource Strain:   . Difficulty of Paying Living Expenses:   Food Insecurity:   . Worried About Running Out of Food in the Last Year:   . Ran Out of Food in the Last Year:   Transportation Needs:   . Lack of Transportation (Medical):   . Lack of Transportation (Non-Medical):   Physical Activity:   . Days of Exercise per Week:   . Minutes of Exercise per Session:   Stress:   . Feeling of Stress :   Social Connections:   . Frequency of Communication with Friends and Family:   . Frequency of Social Gatherings with Friends and Family:   . Attends Religious Services:   . Active Member of Clubs or Organizations:   . Attends Club or Organization Meetings:   . Marital Status:   Intimate Partner Violence:   . Fear of Current or Ex-Partner:   . Emotionally Abused:   . Physically Abused:   . Sexually Abused:    Family History  Problem Relation Age of Onset  . Heart disease Mother   . Kidney disease Mother   . Breast cancer Mother   . Coronary artery disease Mother        CABG at age 76  . Dementia Father   . Stroke Father   . Stroke Maternal Grandmother   . Cancer Maternal Grandfather   . Cancer Paternal Grandmother   . Kidney disease Paternal Grandfather     Objective: Office vital signs reviewed. BP 118/70   Pulse 63   Temp (!) 97.1 F (36.2 C)   Ht 5' 6" (1.676 m)   Wt 176 lb 3.2 oz (79.9 kg)   SpO2 98%   BMI 28.44 kg/m   Physical Examination:  General: Awake, alert, well nourished, No acute distress HEENT: Normal, sclera white, MMM; mild bulging of bilateral TMs.  No perforation. Cardio: regular rate and rhythm, S1S2 heard, no murmurs appreciated Pulm: clear to auscultation bilaterally, no wheezes, rhonchi or rales; normal work of breathing on room air Extremities: warm, well perfused, No edema, cyanosis or clubbing; +2 pulses bilaterally MSK: normal gait and station  Knees: Right knee with appreciable joint effusion.  She has  fullness along the medial aspect of the knee.  Left knee with no significant joint effusion.  No tenderness palpation to the patella, patellar tendon, quad tendon.  No ligamentous laxity.  She has prominent tibial tuberosities.  No erythema or increased warmth. Skin: dry; intact; no rashes or lesions Neuro light touch sensation grossly intact  Assessment/ Plan: 68 y.o. female   1. Essential hypertension Controlled.    Continue current regimen - CMP14+EGFR  2. Mixed hyperlipidemia - CMP14+EGFR  3. Establishing care with new doctor, encounter for  4. Gastroesophageal reflux disease without esophagitis Controlled.  Continue current regimen  5. Elevated hemoglobin A1c Check A1c.  If persistently elevated, formal diagnosis of type 2 diabetes will be made. - Bayer DCA Hb A1c Waived  6. Chronic gout without tophus, unspecified cause, unspecified site Check uric acid.  Continue allopurinol - Uric Acid  7. Primary osteoarthritis of both knees Topical Voltaren gel reinforced.  Home care instructions were discussed. - diclofenac Sodium (VOLTAREN) 1 % GEL; Apply 4 g topically 4 (four) times daily.  Dispense: 400 g; Refill: 3  8. Need for tetanus, diphtheria, and acellular pertussis (Tdap) vaccine Administered during today's visit - Tdap vaccine greater than or equal to 7yo IM  9. Sensation of fullness in both ears Reinforced nasal saline, nasal corticosteroid and oral antihistamine.  Push oral fluids.   Orders Placed This Encounter  Procedures  . CMP14+EGFR  . Uric Acid  . Bayer DCA Hb A1c Waived   No orders of the defined types were placed in this encounter.    Ashly M Gottschalk, DO [facility] [phone]   

## 2020-02-08 NOTE — Telephone Encounter (Signed)
Pa in process  Key: [id number] - Rx #: [id number] Need help? Call us at [phone] Status Additional Information Required Drug Diclofenac Sodium 1% gel Form Blue Building control surveyor Form (CB) Original Claim Info 75 DRUG REQUIRES PRIOR AUTHORIZATION

## 2020-02-08 NOTE — Patient Instructions (Signed)
You had labs performed today.  You will be contacted with the results of the labs once they are available, usually in the next 3 business days for routine lab work.  If you have an active my chart account, they will be released to your MyChart.  If you prefer to have these labs released to you via telephone, please let us know.  If you had a pap smear or biopsy performed, expect to be contacted in about 7-10 days.   Start Voltaren gel topically up to 4 times daily.  Chronic Knee Pain, Adult Chronic knee pain is pain in one or both knees that lasts longer than 3 months. Symptoms of chronic knee pain may include swelling, stiffness, and discomfort. Age-related wear and tear (osteoarthritis) of the knee joint is the most common cause of chronic knee pain. Other possible causes include:  A long-term immune-related disease that causes inflammation of the knee (rheumatoid arthritis). This usually affects both knees.  Inflammatory arthritis, such as gout or pseudogout.  An injury to the knee that causes arthritis.  An injury to the knee that damages the ligaments. Ligaments are strong tissues that connect bones to each other.  Runner's knee or pain behind the kneecap. Treatment for chronic knee pain depends on the cause. The main treatments for chronic knee pain are physical therapy and weight loss. This condition may also be treated with medicines, injections, a knee sleeve or brace, and by using crutches. Rest, ice, compression (pressure), and elevation (RICE) therapy may also be recommended. Follow these instructions at home: If you have a knee sleeve or brace:   Wear it as told by your health care provider. Remove it only as told by your health care provider.  Loosen it if your toes tingle, become numb, or turn cold and blue.  Keep it clean.  If the sleeve or brace is not waterproof: ? Do not let it get wet. ? Remove it if allowed by your health care provider, or cover it with a  watertight covering when you take a bath or a shower. Managing pain, stiffness, and swelling      If directed, apply heat to the affected area as often as told by your health care provider. Use the heat source that your health care provider recommends, such as a moist heat pack or a heating pad. ? If you have a removable sleeve or brace, remove it as told by your health care provider. ? Place a towel between your skin and the heat source. ? Leave the heat on for 20-30 minutes. ? Remove the heat if your skin turns bright red. This is especially important if you are unable to feel pain, heat, or cold. You may have a greater risk of getting burned.  If directed, put ice on the affected area. ? If you have a removable sleeve or brace, remove it as told by your health care provider. ? Put ice in a plastic bag. ? Place a towel between your skin and the bag. ? Leave the ice on for 20 minutes, 2-3 times a day.  Move your toes often to reduce stiffness and swelling.  Raise (elevate) the injured area above the level of your heart while you are sitting or lying down. Activity  Avoid activities where both feet leave the ground at the same time (high-impact activities). Examples are running, jumping rope, and doing jumping jacks.  Return to your normal activities as told by your health care provider. Ask your health care  provider what activities are safe for you.  Follow the exercise plan that your health care provider designed for you. Your health care provider may suggest that you: ? Avoid activities that make knee pain worse. This may require you to change your exercise routines, sport participation, or job duties. ? Wear shoes with cushioned soles. ? Avoid high-impact activities or sports that require running and sudden changes in direction. ? Do physical therapy as told by your health care provider. Physical therapy is planned to match your needs and abilities. It may include exercises for  strength, flexibility, stability, and endurance. ? Do exercises that increase balance and strength, such as tai chi and yoga.  Do not use the injured limb to support your body weight until your health care provider says that you can. Use crutches, a cane, or a walker, as told by your health care provider. General instructions  Take over-the-counter and prescription medicines only as told by your health care provider.  Lose weight if you are overweight. Losing even a little weight can reduce knee pain. Ask your health care provider what your ideal weight is, and how to safely lose extra weight. A food expert (dietitian) may be able to help you plan your meals.  Do not use any products that contain nicotine or tobacco, such as cigarettes, e-cigarettes, and chewing tobacco. These can delay healing. If you need help quitting, ask your health care provider.  Keep all follow-up visits as told by your health care provider. This is important. Contact a health care provider if:  You have knee pain that is not getting better or gets worse.  You are unable to do your physical therapy exercises due to knee pain. Get help right away if:  Your knee swells and the swelling becomes worse.  You cannot move your knee.  You have severe knee pain. Summary  Knee pain that lasts more than 3 months is considered chronic knee pain.  The main treatments for chronic knee pain are physical therapy and weight loss. You may also need to take medicines, wear a knee sleeve or brace, use crutches, and apply ice or heat.  Losing even a little weight can reduce knee pain. Ask your health care provider what your ideal weight is, and how to safely lose extra weight. A food expert (dietitian) may be able to help you plan your meals.  Work with a physical therapist to make a safe exercise program, as told by your health care provider. This information is not intended to replace advice given to you by your health care  provider. Make sure you discuss any questions you have with your health care provider. Document Revised: 11/12/2018 Document Reviewed: 11/12/2018 Elsevier Patient Education  [place].

## 2020-02-10 NOTE — Telephone Encounter (Signed)
[facility]: [id number] - Rx #[id number] Need help? Call us at [phone] Status Additional Information Required Drug Diclofenac Sodium 1% gel Form Blue Building control surveyor Form (CB) Original Claim Info 75 DRUG REQUIRES PRIOR AUTHORIZATION  PHARMACY AWARE

## 2020-02-17 ENCOUNTER — Telehealth: Payer: Self-pay | Admitting: Family Medicine

## 2020-02-17 ENCOUNTER — Encounter: Payer: Self-pay | Admitting: Family

## 2020-02-17 ENCOUNTER — Ambulatory Visit (INDEPENDENT_AMBULATORY_CARE_PROVIDER_SITE_OTHER): Payer: BC Managed Care – PPO | Admitting: Family

## 2020-02-17 DIAGNOSIS — J329 Chronic sinusitis, unspecified: Secondary | ICD-10-CM

## 2020-02-17 DIAGNOSIS — J40 Bronchitis, not specified as acute or chronic: Secondary | ICD-10-CM | POA: Diagnosis not present

## 2020-02-17 MED ORDER — AMOXICILLIN-POT CLAVULANATE 875-125 MG PO TABS: 1.0000 | ORAL_TABLET | Freq: Two times a day (BID) | ORAL | 0 refills | Status: DC

## 2020-02-17 NOTE — Telephone Encounter (Signed)
  Prescription Request  02/17/2020  What is the name of the medication or equipment? Pt needs something for sinus infection and bronchitis. She had televisit with dr g on 05/25 and is worse  Have you contacted your pharmacy to request a refill? (if applicable) no   Which pharmacy would you like this sent to? [facility]   Patient notified that their request is being sent to the clinical staff for review and that they should receive a response within 2 business days.

## 2020-02-17 NOTE — Progress Notes (Signed)
   Virtual Visit via telephone Note Due to COVID-19 pandemic this visit was conducted virtually. This visit type was conducted due to national recommendations for restrictions regarding the COVID-19 Pandemic (e.g. social distancing, sheltering in place) in an effort to limit this patient's exposure and mitigate transmission in our community. All issues noted in this document were discussed and addressed.  A physical exam was not performed with this format.  I connected with [name] on 02/17/20 at 10:13 Am by telephone and verified that I am speaking with the correct person using two identifiers. [name] is currently located at work and no one is currently with her during visit. The provider, Evelina Dun, FNP is located in their office at time of visit.  I discussed the limitations, risks, security and privacy concerns of performing an evaluation and management service by telephone and the availability of in person appointments. I also discussed with the patient that there may be a patient responsible charge related to this service. The patient expressed understanding and agreed to proceed.   History and Present Illness:  Cough This is a new problem. The current episode started 1 to 4 weeks ago. The problem has been gradually worsening. The problem occurs every few minutes. The cough is productive of sputum. Associated symptoms include ear congestion, ear pain, nasal congestion, postnasal drip and a sore throat. Pertinent negatives include no chills, fever, headaches, myalgias, rhinorrhea, shortness of breath or wheezing. Associated symptoms comments: hoarse. The symptoms are aggravated by lying down. She has tried rest (singulair, saline nasal spray) for the symptoms. The treatment provided no relief.      Review of Systems  Constitutional: Negative for chills and fever.  HENT: Positive for ear pain, postnasal drip and sore throat. Negative for rhinorrhea.   Respiratory: Positive  for cough. Negative for shortness of breath and wheezing.   Musculoskeletal: Negative for myalgias.  Neurological: Negative for headaches.  All other systems reviewed and are negative.    Observations/Objective: No SOB or distress noted, hoarse voice and constant tight nonproductive cough  Assessment and Plan: 1. Sinobronchitis Continue allergy medications - Take meds as prescribed - Use a cool mist humidifier  -Use saline nose sprays frequently -Force fluids -For any cough or congestion  Use plain Mucinex- regular strength or max strength is fine -For fever or aces or pains- take tylenol or ibuprofen. -Throat lozenges if help -RTO if symptom worsen or do not improve  - amoxicillin-clavulanate (AUGMENTIN) 875-125 MG tablet; Take 1 tablet by mouth 2 (two) times daily.  Dispense: 14 tablet; Refill: 0    I discussed the assessment and treatment plan with the patient. The patient was provided an opportunity to ask questions and all were answered. The patient agreed with the plan and demonstrated an understanding of the instructions.   The patient was advised to call back or seek an in-person evaluation if the symptoms worsen or if the condition fails to improve as anticipated.  The above assessment and management plan was discussed with the patient. The patient verbalized understanding of and has agreed to the management plan. Patient is aware to call the clinic if symptoms persist or worsen. Patient is aware when to return to the clinic for a follow-up visit. Patient educated on when it is appropriate to go to the emergency department.   Time call ended:  10:21 AM  I provided 8 minutes of non-face-to-face time during this encounter.    Evelina Dun, FNP

## 2020-03-02 DIAGNOSIS — M9901 Segmental and somatic dysfunction of cervical region: Secondary | ICD-10-CM | POA: Diagnosis not present

## 2020-03-02 DIAGNOSIS — M62830 Muscle spasm of back: Secondary | ICD-10-CM | POA: Diagnosis not present

## 2020-03-02 DIAGNOSIS — M9902 Segmental and somatic dysfunction of thoracic region: Secondary | ICD-10-CM | POA: Diagnosis not present

## 2020-03-02 DIAGNOSIS — M9903 Segmental and somatic dysfunction of lumbar region: Secondary | ICD-10-CM | POA: Diagnosis not present

## 2020-03-06 DIAGNOSIS — M9902 Segmental and somatic dysfunction of thoracic region: Secondary | ICD-10-CM | POA: Diagnosis not present

## 2020-03-06 DIAGNOSIS — M9901 Segmental and somatic dysfunction of cervical region: Secondary | ICD-10-CM | POA: Diagnosis not present

## 2020-03-06 DIAGNOSIS — M9903 Segmental and somatic dysfunction of lumbar region: Secondary | ICD-10-CM | POA: Diagnosis not present

## 2020-03-06 DIAGNOSIS — M62830 Muscle spasm of back: Secondary | ICD-10-CM | POA: Diagnosis not present

## 2020-03-09 DIAGNOSIS — M9901 Segmental and somatic dysfunction of cervical region: Secondary | ICD-10-CM | POA: Diagnosis not present

## 2020-03-09 DIAGNOSIS — M9902 Segmental and somatic dysfunction of thoracic region: Secondary | ICD-10-CM | POA: Diagnosis not present

## 2020-03-09 DIAGNOSIS — M9903 Segmental and somatic dysfunction of lumbar region: Secondary | ICD-10-CM | POA: Diagnosis not present

## 2020-03-09 DIAGNOSIS — M62830 Muscle spasm of back: Secondary | ICD-10-CM | POA: Diagnosis not present

## 2020-03-21 ENCOUNTER — Other Ambulatory Visit: Payer: Self-pay | Admitting: Internal Medicine

## 2020-03-21 DIAGNOSIS — E782 Mixed hyperlipidemia: Secondary | ICD-10-CM

## 2020-03-21 DIAGNOSIS — I10 Essential (primary) hypertension: Secondary | ICD-10-CM

## 2020-03-21 DIAGNOSIS — Z79899 Other long term (current) drug therapy: Secondary | ICD-10-CM

## 2020-03-30 ENCOUNTER — Other Ambulatory Visit: Payer: Self-pay | Admitting: Internal Medicine

## 2020-04-05 DIAGNOSIS — Z1231 Encounter for screening mammogram for malignant neoplasm of breast: Secondary | ICD-10-CM | POA: Diagnosis not present

## 2020-04-05 DIAGNOSIS — I10 Essential (primary) hypertension: Secondary | ICD-10-CM | POA: Diagnosis not present

## 2020-04-05 DIAGNOSIS — Z79899 Other long term (current) drug therapy: Secondary | ICD-10-CM | POA: Diagnosis not present

## 2020-04-05 DIAGNOSIS — E782 Mixed hyperlipidemia: Secondary | ICD-10-CM | POA: Diagnosis not present

## 2020-04-05 LAB — HEPATIC FUNCTION PANEL
ALT: 21 IU/L (ref 0–32)
AST: 28 IU/L (ref 0–40)
Albumin: 4.9 g/dL — ABNORMAL HIGH (ref 3.8–4.8)
Alkaline Phosphatase: 77 IU/L (ref 48–121)
Bilirubin Total: 0.6 mg/dL (ref 0.0–1.2)
Bilirubin, Direct: 0.14 mg/dL (ref 0.00–0.40)
Total Protein: 6.9 g/dL (ref 6.0–8.5)

## 2020-04-05 LAB — BASIC METABOLIC PANEL
BUN/Creatinine Ratio: 14 (ref 12–28)
BUN: 14 mg/dL (ref 8–27)
CO2: 27 mmol/L (ref 20–29)
Calcium: 9.7 mg/dL (ref 8.7–10.3)
Chloride: 99 mmol/L (ref 96–106)
Creatinine, Ser: 0.98 mg/dL (ref 0.57–1.00)
GFR calc Af Amer: 69 mL/min/{1.73_m2} (ref >59)
GFR calc non Af Amer: 59 mL/min/{1.73_m2} — ABNORMAL LOW (ref >59)
Glucose: 112 mg/dL — ABNORMAL HIGH (ref 65–99)
Potassium: 3.4 mmol/L — ABNORMAL LOW (ref 3.5–5.2)
Sodium: 142 mmol/L (ref 134–144)

## 2020-04-05 LAB — LIPID PANEL
Chol/HDL Ratio: 4 ratio (ref 0.0–4.4)
Cholesterol, Total: 183 mg/dL (ref 100–199)
HDL: 46 mg/dL (ref >39)
LDL Chol Calc (NIH): 116 mg/dL — ABNORMAL HIGH (ref 0–99)
Triglycerides: 117 mg/dL (ref 0–149)
VLDL Cholesterol Cal: 21 mg/dL (ref 5–40)

## 2020-04-10 DIAGNOSIS — M9903 Segmental and somatic dysfunction of lumbar region: Secondary | ICD-10-CM | POA: Diagnosis not present

## 2020-04-10 DIAGNOSIS — M9901 Segmental and somatic dysfunction of cervical region: Secondary | ICD-10-CM | POA: Diagnosis not present

## 2020-04-10 DIAGNOSIS — M62830 Muscle spasm of back: Secondary | ICD-10-CM | POA: Diagnosis not present

## 2020-04-10 DIAGNOSIS — M9902 Segmental and somatic dysfunction of thoracic region: Secondary | ICD-10-CM | POA: Diagnosis not present

## 2020-04-19 ENCOUNTER — Other Ambulatory Visit: Payer: Self-pay | Admitting: Internal Medicine

## 2020-04-19 MED ORDER — ATENOLOL-CHLORTHALIDONE 50-25 MG PO TABS: 1.0000 | ORAL_TABLET | Freq: Every day | ORAL | 0 refills | Status: DC

## 2020-04-19 NOTE — Telephone Encounter (Signed)
*  STAT* If patient is at the pharmacy, call can be transferred to refill team.   1. Which medications need to be refilled? (please list name of each medication and dose if known) Atenolol  2. Which pharmacy/location (including street and city if local pharmacy) is medication to be sent to? [facility]- [phone]  3. Do they need a 30 day or 90 day supply? #90- pt have an appt on 05-11-20

## 2020-04-19 NOTE — Telephone Encounter (Signed)
Refill for Atenolol sent to pharmacy.

## 2020-05-03 DIAGNOSIS — M9902 Segmental and somatic dysfunction of thoracic region: Secondary | ICD-10-CM | POA: Diagnosis not present

## 2020-05-03 DIAGNOSIS — M62830 Muscle spasm of back: Secondary | ICD-10-CM | POA: Diagnosis not present

## 2020-05-03 DIAGNOSIS — M9901 Segmental and somatic dysfunction of cervical region: Secondary | ICD-10-CM | POA: Diagnosis not present

## 2020-05-03 DIAGNOSIS — M9903 Segmental and somatic dysfunction of lumbar region: Secondary | ICD-10-CM | POA: Diagnosis not present

## 2020-05-08 DIAGNOSIS — M9901 Segmental and somatic dysfunction of cervical region: Secondary | ICD-10-CM | POA: Diagnosis not present

## 2020-05-08 DIAGNOSIS — M9902 Segmental and somatic dysfunction of thoracic region: Secondary | ICD-10-CM | POA: Diagnosis not present

## 2020-05-08 DIAGNOSIS — M9903 Segmental and somatic dysfunction of lumbar region: Secondary | ICD-10-CM | POA: Diagnosis not present

## 2020-05-08 DIAGNOSIS — M62830 Muscle spasm of back: Secondary | ICD-10-CM | POA: Diagnosis not present

## 2020-05-11 ENCOUNTER — Other Ambulatory Visit: Payer: Self-pay

## 2020-05-11 ENCOUNTER — Ambulatory Visit: Payer: BC Managed Care – PPO | Admitting: Internal Medicine

## 2020-05-11 ENCOUNTER — Encounter: Payer: Self-pay | Admitting: Internal Medicine

## 2020-05-11 VITALS — BP 126/80 | HR 63 | Ht 66.0 in | Wt 177.4 lb

## 2020-05-11 DIAGNOSIS — E782 Mixed hyperlipidemia: Secondary | ICD-10-CM

## 2020-05-11 DIAGNOSIS — Z8249 Family history of ischemic heart disease and other diseases of the circulatory system: Secondary | ICD-10-CM | POA: Diagnosis not present

## 2020-05-11 DIAGNOSIS — M62830 Muscle spasm of back: Secondary | ICD-10-CM | POA: Diagnosis not present

## 2020-05-11 DIAGNOSIS — M9903 Segmental and somatic dysfunction of lumbar region: Secondary | ICD-10-CM | POA: Diagnosis not present

## 2020-05-11 DIAGNOSIS — I10 Essential (primary) hypertension: Secondary | ICD-10-CM

## 2020-05-11 DIAGNOSIS — M9902 Segmental and somatic dysfunction of thoracic region: Secondary | ICD-10-CM | POA: Diagnosis not present

## 2020-05-11 DIAGNOSIS — M9901 Segmental and somatic dysfunction of cervical region: Secondary | ICD-10-CM | POA: Diagnosis not present

## 2020-05-11 NOTE — Progress Notes (Signed)
OFFICE NOTE  Chief Complaint:  Routine follow-up  Primary Care Physician: Janora Norlander, DO  HPI:  [name] is a 69 y.o. female Who I previously seen in 2011 for palpitations and dyslipidemia. She is a former patient of Dr. Janene Madeira. Her mother [name] was a patient of mine who died recently. I have not seen her since 2011 although she had an appointment scheduled in 2015 which was canceled. Recently she's noted that she's had some elevated blood pressures. She was preparing for a beach trip and had some chest discomfort. This lasted for 2 or 3 hours and was described as a vertical line down the center of her chest. It was more of a dull pressure and resolved spontaneously. She's not had any exertional symptoms however is limited by a pinched nerve in her low back. She did have a cane for which she used to walk today. She does not exercise regularly. She also mentioned an episode where she had about 8 hours of difficulty recalling names and some memory loss after a traumatic event shortly after her mother died. She had an extensive workup for this including an MRI of the brain and ultimately no clear cause was found. She reports very good control of her her palpitations.  11/21/2017  [name] returns today for follow-up.  Overall she is doing well.  She denies any chest pain or worsening shortness of breath.  Previously I had added chlorthalidone to her atenolol.  She is doing well with that and reports blood pressures in the 409W and 119 systolic at home.  Recently she had developed a cyst on her left middle finger.  She had surgery for that this week.  She has reported some fatigue and wonders whether or not that may be related to a B12 deficiency.  There are apparently some reports that B12 deficiency can lead to cysts such as when she had on her hand.  Additionally, we discussed her cholesterol today.  In the past she has been intolerant to rosuvastatin and Vytorin.  She  was placed on pravastatin by me for elevated LDL cholesterol close to 190.  She had a marked reduction in total cholesterol on it however may have had side effects.  She discontinued it over a year ago and is not restarted.  She has no known atherosclerotic cardiovascular disease.  In 2016 she had carotid Dopplers which were negative in the setting of a TIA.  The etiology of the TIA is not clear.  03/16/2018  [name] returns today for follow-up.  She underwent coronary artery calcium scoring which was reported as 0, however she was found to have a small subcentimeter nodule which will need follow-up with a repeat CT scan in a year.  Overall she is doing well.  She was confused and got her lipid profile a month earlier than her appointment.  At lipid profile showed total cholesterol 198, triglycerides 178, HDL 48 and LDL 114.  She therefore has had improvement on pravastatin.  We discussed whether or not she actually needs to be on statin therapy, interestingly since she has a 0 calcium score.  Her ten-year risk is actually quite low however one considers her 30-year risk which is reasonable given her age in the mid 68s, would argue for statin therapy provided her diet is optimized.  She seems to be tolerating pravastatin without side effects.  05/12/2019  Mrs. Havener is seen today in routine follow-up.  Overall she seems  to be doing well.  She has been taking pravastatin without any side effects.  She is noted a marked improvement in her lipid profile.  Total cholesterol is now 204, triglycerides 142, HDL 48 and LDL 128.  We had suggested a goal LDL less than 100.  Although her LDL is a little higher, this may represent recent dietary changes.  Her calcium score was 0 suggesting low risk of cardiovascular events.  Blood pressure is well controlled today.  05/11/2020  [name] is seen today in follow-up.  Overall she seems to be doing well.  Most recently she had a lipid profile showing total cholesterol 187,  triglycerides 117, HDL 46 and LDL 116.  She has been intolerant in the past to Crestor and Vytorin both causing myalgias.  Currently she is on pravastatin 40 mg.  EKG shows normal sinus rhythm.  She denies chest pain or shortness of breath.  As mentioned above she had a low calcium score of 0.  I would recommend a target LDL less than 100.  PMHx:  Past Medical History:  Diagnosis Date  . Allergy   . Asthma   . Back pain   . Cancer ([place])    skin cancer  . Depression   . Hyperlipidemia     Past Surgical History:  Procedure Laterality Date  . bladder mesh    . COLONOSCOPY N/A 07/12/2015   Procedure: COLONOSCOPY;  Surgeon: Rogene Houston, MD;  Location: [facility];  Service: Endoscopy;  Laterality: N/A;  1030  . NM MYOCAR PERF WALL MOTION  02/2007   dipyridamole myoview; EF 70%, no wall motion abnormalities, no inducible ischemia, low risk   . OVARIAN CYST REMOVAL  1987  . TONSILECTOMY/ADENOIDECTOMY WITH MYRINGOTOMY  1958  . TONSILLECTOMY    . TRANSTHORACIC ECHOCARDIOGRAM  02/2007   RV mildly dlated; LV normal in size; mild MR with thickened MV leaflets; mild TR; mild pulm valve regurg  . VAGINAL DELIVERY     x3    FAMHx:  Family History  Problem Relation Age of Onset  . Heart disease Mother   . Kidney disease Mother   . Breast cancer Mother   . Coronary artery disease Mother        CABG at age 67  . Dementia Father   . Stroke Father   . Stroke Maternal Grandmother   . Cancer Maternal Grandfather   . Cancer Paternal Grandmother   . Kidney disease Paternal Grandfather     SOCHx:   reports that she has never smoked. She has never used smokeless tobacco. She reports that she does not drink alcohol and does not use drugs.  ALLERGIES:  Allergies  Allergen Reactions  . Bee Venom Swelling  . Crestor [Rosuvastatin] Other (See Comments)    Myalgias  . Erythromycin Itching, Rash and Other (See Comments)  . Latex Itching and Rash    rash  . Rosuvastatin Calcium Other (See  Comments)    Myalgias  . Aleve [Naproxen Sodium]     Gastritis  . Tetracyclines & Related Other (See Comments)    unknown  . Vytorin [Ezetimibe-Simvastatin] Other (See Comments)    unknown    ROS: Pertinent items noted in HPI and remainder of comprehensive ROS otherwise negative.  HOME MEDS: Current Outpatient Medications on File Prior to Visit  Medication Sig Dispense Refill  . albuterol (PROVENTIL HFA;VENTOLIN HFA) 108 (90 Base) MCG/ACT inhaler Inhale 2 puffs into the lungs every 4 (four) hours as needed for wheezing  or shortness of breath. 1 Inhaler 0  . allopurinol (ZYLOPRIM) 300 MG tablet Take 1 tablet (300 mg total) by mouth every evening. 90 tablet 3  . amoxicillin-clavulanate (AUGMENTIN) 875-125 MG tablet Take 1 tablet by mouth 2 (two) times daily. 14 tablet 0  . atenolol-chlorthalidone (TENORETIC) 50-25 MG tablet Take 1 tablet by mouth daily. 90 tablet 0  . Biotin 1 MG CAPS Take by mouth.    . budesonide (PULMICORT) 180 MCG/ACT inhaler Inhale 2 puffs into the lungs 2 (two) times daily.     . cetirizine (ZYRTEC ALLERGY) 10 MG tablet Take 1 tablet by mouth daily.    . cholecalciferol (VITAMIN D3) 25 MCG (1000 UT) tablet Take 1,000 Units by mouth daily.    . diclofenac Sodium (VOLTAREN) 1 % GEL Apply 4 g topically 4 (four) times daily. 400 g 3  . esomeprazole (NEXIUM) 20 MG capsule Take 20 mg by mouth daily at 12 noon.    . ferrous sulfate 325 (65 FE) MG EC tablet Take 325 mg by mouth daily with breakfast.     . montelukast (SINGULAIR) 10 MG tablet TAKE 1 TABLET(10 MG) BY MOUTH EVERY MORNING 90 tablet 1  . Multiple Vitamin (MULTIVITAMIN) capsule Take 1 capsule by mouth daily.     Marland Kitchen POTASSIUM PO Take 1 tablet by mouth daily.    . pravastatin (PRAVACHOL) 40 MG tablet TAKE 1 TABLET(40 MG) BY MOUTH DAILY 90 tablet 0  . sertraline (ZOLOFT) 25 MG tablet Take 25 mg by mouth daily.   1   No current facility-administered medications on file prior to visit.    LABS/IMAGING: No  results found for this or any previous visit (from the past 48 hour(s)). No results found.  WEIGHTS: Wt Readings from Last 3 Encounters:  05/11/20 177 lb 6.4 oz (80.5 kg)  02/08/20 176 lb 3.2 oz (79.9 kg)  10/14/19 183 lb 6.4 oz (83.2 kg)    VITALS: BP 126/80   Pulse 63   Ht 5\' 6"  (1.676 m)   Wt 177 lb 6.4 oz (80.5 kg)   BMI 28.63 kg/m   EXAM: General appearance: alert and no distress Neck: no carotid bruit, no JVD and thyroid not enlarged, symmetric, no tenderness/mass/nodules Lungs: clear to auscultation bilaterally Heart: regular rate and rhythm Abdomen: soft, non-tender; bowel sounds normal; no masses,  no organomegaly Extremities: extremities normal, atraumatic, no cyanosis or edema Pulses: 2+ and symmetric Skin: Skin color, texture, turgor normal. No rashes or lesions Neurologic: Grossly normal Psych: Pleasant  EKG: Normal sinus rhythm at 63-personally reviewed  ASSESSMENT: 1. Dyslipidemia 2. Hypertension - uncontrolled 3. Palpitations 4. Family history of cardiovascular disease 5. 0 coronary calcium score (11/2017)  PLAN: 1.   Mrs. Frick is doing well and remains asymptomatic.  Blood pressure is well controlled.  Her cholesterol is slightly higher than target LDL less than 70.  She is continue to work on diet and if she is not able to reach that target we may consider adding ezetimibe to her pravastatin.  I suspect her intolerance to Vytorin in the past was related to the simvastatin.  She denies recurrent palpitations.  She did say she gets some occasional cramping and weakness in her legs.  I am not convinced this is related to her pravastatin, however she could consider holding it for 2 weeks to see if it improves.  If this is also not tolerated then we would consider ezetimibe or possibly PCSK9 inhibitor.  Follow-up with me annually or sooner as necessary.  Pixie Casino, MD, [facility], [place] Director of the Advanced Lipid  Disorders &  Cardiovascular Risk Reduction Clinic Diplomate of the American Board of Clinical Lipidology Attending Cardiologist  Direct Dial: [phone]  Fax: [phone]  Website:  [email or web address] 05/11/2020, 3:18 PM

## 2020-05-11 NOTE — Patient Instructions (Signed)
Medication Instructions:  HOLD pravastatin 40mg  for TWO WEEKS -- please call our office with an update on your symptoms  *If you need a refill on your cardiac medications before your next appointment, please call your pharmacy*  Follow-Up: At [facility], you and your health needs are our priority.  As part of our continuing mission to provide you with exceptional heart care, we have created designated Provider Care Teams.  These Care Teams include your primary Cardiologist (physician) and Advanced Practice Providers (APPs -  Physician Assistants and Nurse Practitioners) who all work together to provide you with the care you need, when you need it.  We recommend signing up for the patient portal called "MyChart".  Sign up information is provided on this After Visit Summary.  MyChart is used to connect with patients for Virtual Visits (Telemedicine).  Patients are able to view lab/test results, encounter notes, upcoming appointments, etc.  Non-urgent messages can be sent to your provider as well.   To learn more about what you can do with MyChart, go to [email or web address].    Your next appointment:   12 month(s)  The format for your next appointment:   In Person  Provider:   You may see Dr. Debara Pickett or one of the following Advanced Practice Providers on your designated Care Team:    Almyra Deforest, PA-C  Fabian Sharp, [place] or   Roby Lofts, [place]    Other Instructions

## 2020-05-13 ENCOUNTER — Encounter: Payer: Self-pay | Admitting: Internal Medicine

## 2020-05-15 DIAGNOSIS — M62830 Muscle spasm of back: Secondary | ICD-10-CM | POA: Diagnosis not present

## 2020-05-15 DIAGNOSIS — M9901 Segmental and somatic dysfunction of cervical region: Secondary | ICD-10-CM | POA: Diagnosis not present

## 2020-05-15 DIAGNOSIS — M9903 Segmental and somatic dysfunction of lumbar region: Secondary | ICD-10-CM | POA: Diagnosis not present

## 2020-05-15 DIAGNOSIS — M9902 Segmental and somatic dysfunction of thoracic region: Secondary | ICD-10-CM | POA: Diagnosis not present

## 2020-05-23 DIAGNOSIS — M62830 Muscle spasm of back: Secondary | ICD-10-CM | POA: Diagnosis not present

## 2020-05-23 DIAGNOSIS — M9903 Segmental and somatic dysfunction of lumbar region: Secondary | ICD-10-CM | POA: Diagnosis not present

## 2020-05-23 DIAGNOSIS — M9901 Segmental and somatic dysfunction of cervical region: Secondary | ICD-10-CM | POA: Diagnosis not present

## 2020-05-23 DIAGNOSIS — M9902 Segmental and somatic dysfunction of thoracic region: Secondary | ICD-10-CM | POA: Diagnosis not present

## 2020-05-25 DIAGNOSIS — M9903 Segmental and somatic dysfunction of lumbar region: Secondary | ICD-10-CM | POA: Diagnosis not present

## 2020-05-25 DIAGNOSIS — M9901 Segmental and somatic dysfunction of cervical region: Secondary | ICD-10-CM | POA: Diagnosis not present

## 2020-05-25 DIAGNOSIS — M9902 Segmental and somatic dysfunction of thoracic region: Secondary | ICD-10-CM | POA: Diagnosis not present

## 2020-05-25 DIAGNOSIS — M62830 Muscle spasm of back: Secondary | ICD-10-CM | POA: Diagnosis not present

## 2020-05-31 DIAGNOSIS — M9901 Segmental and somatic dysfunction of cervical region: Secondary | ICD-10-CM | POA: Diagnosis not present

## 2020-05-31 DIAGNOSIS — M62830 Muscle spasm of back: Secondary | ICD-10-CM | POA: Diagnosis not present

## 2020-05-31 DIAGNOSIS — M9903 Segmental and somatic dysfunction of lumbar region: Secondary | ICD-10-CM | POA: Diagnosis not present

## 2020-05-31 DIAGNOSIS — M9902 Segmental and somatic dysfunction of thoracic region: Secondary | ICD-10-CM | POA: Diagnosis not present

## 2020-06-01 DIAGNOSIS — Z01419 Encounter for gynecological examination (general) (routine) without abnormal findings: Secondary | ICD-10-CM | POA: Diagnosis not present

## 2020-06-01 DIAGNOSIS — Z6829 Body mass index (BMI) 29.0-29.9, adult: Secondary | ICD-10-CM | POA: Diagnosis not present

## 2020-06-02 DIAGNOSIS — Z01419 Encounter for gynecological examination (general) (routine) without abnormal findings: Secondary | ICD-10-CM | POA: Diagnosis not present

## 2020-06-03 LAB — HM PAP SMEAR
HPV DNA High Risk: NOT DETECTED
Pap: NEGATIVE

## 2020-06-07 DIAGNOSIS — M9902 Segmental and somatic dysfunction of thoracic region: Secondary | ICD-10-CM | POA: Diagnosis not present

## 2020-06-07 DIAGNOSIS — M9901 Segmental and somatic dysfunction of cervical region: Secondary | ICD-10-CM | POA: Diagnosis not present

## 2020-06-07 DIAGNOSIS — M62830 Muscle spasm of back: Secondary | ICD-10-CM | POA: Diagnosis not present

## 2020-06-07 DIAGNOSIS — M9903 Segmental and somatic dysfunction of lumbar region: Secondary | ICD-10-CM | POA: Diagnosis not present

## 2020-06-14 DIAGNOSIS — M9903 Segmental and somatic dysfunction of lumbar region: Secondary | ICD-10-CM | POA: Diagnosis not present

## 2020-06-14 DIAGNOSIS — M9901 Segmental and somatic dysfunction of cervical region: Secondary | ICD-10-CM | POA: Diagnosis not present

## 2020-06-14 DIAGNOSIS — M9902 Segmental and somatic dysfunction of thoracic region: Secondary | ICD-10-CM | POA: Diagnosis not present

## 2020-06-14 DIAGNOSIS — M62830 Muscle spasm of back: Secondary | ICD-10-CM | POA: Diagnosis not present

## 2020-06-21 DIAGNOSIS — M62830 Muscle spasm of back: Secondary | ICD-10-CM | POA: Diagnosis not present

## 2020-06-21 DIAGNOSIS — M9901 Segmental and somatic dysfunction of cervical region: Secondary | ICD-10-CM | POA: Diagnosis not present

## 2020-06-21 DIAGNOSIS — M9903 Segmental and somatic dysfunction of lumbar region: Secondary | ICD-10-CM | POA: Diagnosis not present

## 2020-06-21 DIAGNOSIS — M9902 Segmental and somatic dysfunction of thoracic region: Secondary | ICD-10-CM | POA: Diagnosis not present

## 2020-06-22 DIAGNOSIS — Z8041 Family history of malignant neoplasm of ovary: Secondary | ICD-10-CM | POA: Diagnosis not present

## 2020-06-26 ENCOUNTER — Other Ambulatory Visit: Payer: Self-pay | Admitting: Family Medicine

## 2020-06-27 ENCOUNTER — Encounter (INDEPENDENT_AMBULATORY_CARE_PROVIDER_SITE_OTHER): Payer: Self-pay | Admitting: Gastroenterology

## 2020-06-27 DIAGNOSIS — M25512 Pain in left shoulder: Secondary | ICD-10-CM | POA: Diagnosis not present

## 2020-07-10 ENCOUNTER — Emergency Department (HOSPITAL_COMMUNITY)
Admission: EM | Admit: 2020-07-10 | Discharge: 2020-07-10 | Disposition: A | Discharge status: Home / Self Care | Payer: BC Managed Care – PPO | Attending: Emergency Medicine | Admitting: Emergency Medicine

## 2020-07-10 ENCOUNTER — Other Ambulatory Visit: Payer: Self-pay

## 2020-07-10 ENCOUNTER — Emergency Department (HOSPITAL_COMMUNITY): Payer: BC Managed Care – PPO

## 2020-07-10 DIAGNOSIS — Z79899 Other long term (current) drug therapy: Secondary | ICD-10-CM | POA: Diagnosis not present

## 2020-07-10 DIAGNOSIS — S0990XA Unspecified injury of head, initial encounter: Secondary | ICD-10-CM | POA: Diagnosis not present

## 2020-07-10 DIAGNOSIS — S0083XA Contusion of other part of head, initial encounter: Secondary | ICD-10-CM | POA: Insufficient documentation

## 2020-07-10 DIAGNOSIS — Z7951 Long term (current) use of inhaled steroids: Secondary | ICD-10-CM | POA: Diagnosis not present

## 2020-07-10 DIAGNOSIS — H0589 Other disorders of orbit: Secondary | ICD-10-CM | POA: Diagnosis not present

## 2020-07-10 DIAGNOSIS — Y92002 Bathroom of unspecified non-institutional (private) residence single-family (private) house as the place of occurrence of the external cause: Secondary | ICD-10-CM | POA: Insufficient documentation

## 2020-07-10 DIAGNOSIS — Z91040 Latex allergy status: Secondary | ICD-10-CM | POA: Insufficient documentation

## 2020-07-10 DIAGNOSIS — Y9301 Activity, walking, marching and hiking: Secondary | ICD-10-CM | POA: Insufficient documentation

## 2020-07-10 DIAGNOSIS — S0993XA Unspecified injury of face, initial encounter: Secondary | ICD-10-CM | POA: Diagnosis not present

## 2020-07-10 DIAGNOSIS — Z85828 Personal history of other malignant neoplasm of skin: Secondary | ICD-10-CM | POA: Insufficient documentation

## 2020-07-10 DIAGNOSIS — J45909 Unspecified asthma, uncomplicated: Secondary | ICD-10-CM | POA: Diagnosis not present

## 2020-07-10 DIAGNOSIS — I10 Essential (primary) hypertension: Secondary | ICD-10-CM | POA: Insufficient documentation

## 2020-07-10 DIAGNOSIS — W2201XA Walked into wall, initial encounter: Secondary | ICD-10-CM | POA: Insufficient documentation

## 2020-07-10 DIAGNOSIS — W19XXXA Unspecified fall, initial encounter: Secondary | ICD-10-CM

## 2020-07-10 MED ORDER — ACETAMINOPHEN 500 MG PO TABS
1000.0000 mg | ORAL_TABLET | Freq: Once | ORAL | Status: AC
  Administered 2020-07-10: 1000 mg via ORAL
  Filled 2020-07-10: qty 2

## 2020-07-10 NOTE — ED Triage Notes (Signed)
Pt walked into a wall today causing her to fall backwards. Pt denies hitting her head or losing consciousness. Pt has a eye injury from walking into the wall.

## 2020-07-10 NOTE — ED Provider Notes (Signed)
[facility] EMERGENCY DEPARTMENT Provider Note   CSN: [id number] Arrival date & time: 07/10/20  1534     History Chief Complaint  Patient presents with  . Fall    [name] is a 69 y.o. female.  Pt presents to the ED today with a head injury.  Pt said she was walking into her bathroom in the dark this morning around 0620.  She accidentally walked into a wall which knocked her backwards.  She did not injure anything other than her head.  She has no injury to her eye itself.  She denies any blurry vision or eye pain.  Pt has pain to her forehead and around her right eye.  The pt also has a headache.  She tried to go to work today, but had to leave due to the headache.  She has not taken anything for her sx.  She is not on blood thinners.        Past Medical History:  Diagnosis Date  . Allergy   . Asthma   . Back pain   . Cancer ([place])    skin cancer  . Depression   . Hyperlipidemia     Patient Active Problem List   Diagnosis Date Noted  . Gastroesophageal reflux disease without esophagitis 02/08/2020  . Asthma   . Family history of heart disease 11/21/2017  . Other fatigue 11/21/2017  . Atypical chest pain 06/21/2016  . Seizures ([place]) 12/14/2014  . Transient memory loss 12/09/2014  . Essential hypertension 12/09/2014  . Mixed hyperlipidemia 12/09/2014    Past Surgical History:  Procedure Laterality Date  . bladder mesh    . COLONOSCOPY N/A 07/12/2015   Procedure: COLONOSCOPY;  Surgeon: Rogene Houston, MD;  Location: [facility] ENDO SUITE;  Service: Endoscopy;  Laterality: N/A;  1030  . NM MYOCAR PERF WALL MOTION  02/2007   dipyridamole myoview; EF 70%, no wall motion abnormalities, no inducible ischemia, low risk   . OVARIAN CYST REMOVAL  1987  . TONSILECTOMY/ADENOIDECTOMY WITH MYRINGOTOMY  1958  . TONSILLECTOMY    . TRANSTHORACIC ECHOCARDIOGRAM  02/2007   RV mildly dlated; LV normal in size; mild MR with thickened MV leaflets; mild TR; mild pulm valve regurg  .  VAGINAL DELIVERY     x3     OB History   No obstetric history on file.     Family History  Problem Relation Age of Onset  . Heart disease Mother   . Kidney disease Mother   . Breast cancer Mother   . Coronary artery disease Mother        CABG at age 20  . Dementia Father   . Stroke Father   . Stroke Maternal Grandmother   . Cancer Maternal Grandfather   . Cancer Paternal Grandmother   . Kidney disease Paternal Grandfather     Social History   Tobacco Use  . Smoking status: Never Smoker  . Smokeless tobacco: Never Used  Vaping Use  . Vaping Use: Never used  Substance Use Topics  . Alcohol use: No  . Drug use: No    Home Medications Prior to Admission medications   Medication Sig Start Date End Date Taking? Authorizing Provider  albuterol (PROVENTIL HFA;VENTOLIN HFA) 108 (90 Base) MCG/ACT inhaler Inhale 2 puffs into the lungs every 4 (four) hours as needed for wheezing or shortness of breath. 08/16/18   Ward, Delice Bison, DO  allopurinol (ZYLOPRIM) 300 MG tablet Take 1 tablet (300 mg total) by mouth every  evening. 07/20/19   Terald Sleeper, PA-C  amoxicillin-clavulanate (AUGMENTIN) 875-125 MG tablet Take 1 tablet by mouth 2 (two) times daily. 02/17/20   Evelina Dun A, FNP  atenolol-chlorthalidone (TENORETIC) 50-25 MG tablet Take 1 tablet by mouth daily. 04/19/20   Hilty, Nadean Corwin, MD  Biotin 1 MG CAPS Take by mouth.    [provider]  budesonide (PULMICORT) 180 MCG/ACT inhaler Inhale 2 puffs into the lungs 2 (two) times daily.     [provider]  cetirizine (ZYRTEC ALLERGY) 10 MG tablet Take 1 tablet by mouth daily.    [provider]  cholecalciferol (VITAMIN D3) 25 MCG (1000 UT) tablet Take 1,000 Units by mouth daily.    [provider]  diclofenac Sodium (VOLTAREN) 1 % GEL Apply 4 g topically 4 (four) times daily. 02/08/20   Janora Norlander, DO  esomeprazole (NEXIUM) 20 MG capsule Take 20 mg by mouth daily at 12 noon.    [provider]  ferrous sulfate 325 (65 FE) MG EC tablet Take 325 mg by mouth daily with breakfast.     [provider]  montelukast (SINGULAIR) 10 MG tablet TAKE 1 TABLET(10 MG) BY MOUTH EVERY MORNING 06/26/20   Ronnie Doss M, DO  Multiple Vitamin (MULTIVITAMIN) capsule Take 1 capsule by mouth daily.     [provider]  POTASSIUM PO Take 1 tablet by mouth daily.    [provider]  pravastatin (PRAVACHOL) 40 MG tablet TAKE 1 TABLET(40 MG) BY MOUTH DAILY Patient taking differently: 2 week statin holiday starting 05/11/20 per Dr. Debara Pickett 03/30/20   Hilty, Nadean Corwin, MD  sertraline (ZOLOFT) 25 MG tablet Take 25 mg by mouth daily.  08/03/18   [provider]    Allergies    Bee venom, Crestor [rosuvastatin], Erythromycin, Latex, Rosuvastatin calcium, Aleve [naproxen sodium], Tetracyclines & related, and Vytorin [ezetimibe-simvastatin]  Review of Systems   Review of Systems  HENT: Positive for facial swelling.   Neurological: Positive for headaches.  All other systems reviewed and are negative.   Physical Exam Updated Vital Signs BP (!) 149/94   Pulse (!) 58   Temp 98.2 F (36.8 C) (Oral)   Resp 17   Ht 5\' 5"  (1.651 m)   Wt 79.8 kg   SpO2 100%   BMI 29.29 kg/m   Physical Exam Vitals and nursing note reviewed.  Constitutional:      Appearance: Normal appearance.  HENT:     Head: Normocephalic.      Right Ear: External ear normal.     Left Ear: External ear normal.     Nose: Nose normal.     Mouth/Throat:     Mouth: Mucous membranes are moist.     Pharynx: Oropharynx is clear.  Eyes:     Extraocular Movements: Extraocular movements intact.     Conjunctiva/sclera: Conjunctivae normal.     Pupils: Pupils are equal, round, and reactive to light.  Cardiovascular:     Rate and Rhythm: Normal rate and regular rhythm.     Pulses: Normal pulses.     Heart sounds: Normal heart sounds.  Pulmonary:     Effort: Pulmonary effort is normal.      Breath sounds: Normal breath sounds.  Abdominal:     General: Abdomen is flat. Bowel sounds are normal.     Palpations: Abdomen is soft.  Musculoskeletal:        General: Normal range of motion.     Cervical back: Normal  range of motion and neck supple.  Skin:    General: Skin is warm.     Capillary Refill: Capillary refill takes less than 2 seconds.  Neurological:     General: No focal deficit present.     Mental Status: She is alert and oriented to person, place, and time.  Psychiatric:        Mood and Affect: Mood normal.        Behavior: Behavior normal.     ED Results / Procedures / Treatments   Labs (all labs ordered are listed, but only abnormal results are displayed) Labs Reviewed - No data to display  EKG None  Radiology CT Head Wo Contrast  Result Date: 07/10/2020 CLINICAL DATA:  Facial trauma. Additional provided: Patient reports walking into a wall today, causing her to fall backwards. Right periorbital ecchymosis. EXAM: CT HEAD WITHOUT CONTRAST CT MAXILLOFACIAL WITHOUT CONTRAST TECHNIQUE: Multidetector CT imaging of the head and maxillofacial structures were performed using the standard protocol without intravenous contrast. Multiplanar CT image reconstructions of the maxillofacial structures were also generated. COMPARISON:  Brain MRI 12/06/2014.  Head CT 12/06/2014. FINDINGS: CT HEAD FINDINGS Brain: Cerebral volume is normal for age. There is no acute intracranial hemorrhage. No demarcated cortical infarct. No extra-axial fluid collection. No evidence of intracranial mass. No midline shift. Vascular: No hyperdense vessel.  Atherosclerotic calcifications. Skull: Normal. Negative for fracture or focal lesion. Other: No significant mastoid effusion. CT MAXILLOFACIAL FINDINGS Osseous: No acute maxillofacial fracture is identified. Orbits: There is a subcentimeter focus of ill-defined hyperdensity within the extraconal inferior right orbit, along the lateral aspect of the  inferior rectus muscle (series 11, image 30) (series 7, image 31). The globes are normal in size and contour. The extraocular muscles and optic nerve sheath complexes are symmetric and unremarkable. Sinuses: No significant paranasal sinus disease. Soft tissues: There is no definite periorbital or maxillofacial soft tissue swelling appreciable by CT. IMPRESSION: CT head: No evidence of acute intracranial abnormality. CT maxillofacial: 1. No evidence of acute maxillofacial fracture. 2. Subcentimeter focus of ill-defined hyperdensity within the extraconal inferior right orbit, along the lateral aspect of the inferior rectus muscle. This finding is nonspecific, but may reflect a small amount of intraorbital hemorrhage given the provided history of facial trauma with right periorbital ecchymosis. Electronically Signed   By: Kellie Simmering DO   On: 07/10/2020 17:44   CT Maxillofacial Wo Contrast  Result Date: 07/10/2020 CLINICAL DATA:  Facial trauma. Additional provided: Patient reports walking into a wall today, causing her to fall backwards. Right periorbital ecchymosis. EXAM: CT HEAD WITHOUT CONTRAST CT MAXILLOFACIAL WITHOUT CONTRAST TECHNIQUE: Multidetector CT imaging of the head and maxillofacial structures were performed using the standard protocol without intravenous contrast. Multiplanar CT image reconstructions of the maxillofacial structures were also generated. COMPARISON:  Brain MRI 12/06/2014.  Head CT 12/06/2014. FINDINGS: CT HEAD FINDINGS Brain: Cerebral volume is normal for age. There is no acute intracranial hemorrhage. No demarcated cortical infarct. No extra-axial fluid collection. No evidence of intracranial mass. No midline shift. Vascular: No hyperdense vessel.  Atherosclerotic calcifications. Skull: Normal. Negative for fracture or focal lesion. Other: No significant mastoid effusion. CT MAXILLOFACIAL FINDINGS Osseous: No acute maxillofacial fracture is identified. Orbits: There is a  subcentimeter focus of ill-defined hyperdensity within the extraconal inferior right orbit, along the lateral aspect of the inferior rectus muscle (series 11, image 30) (series 7, image 31). The globes are normal in size and contour. The extraocular muscles and optic nerve sheath complexes are  symmetric and unremarkable. Sinuses: No significant paranasal sinus disease. Soft tissues: There is no definite periorbital or maxillofacial soft tissue swelling appreciable by CT. IMPRESSION: CT head: No evidence of acute intracranial abnormality. CT maxillofacial: 1. No evidence of acute maxillofacial fracture. 2. Subcentimeter focus of ill-defined hyperdensity within the extraconal inferior right orbit, along the lateral aspect of the inferior rectus muscle. This finding is nonspecific, but may reflect a small amount of intraorbital hemorrhage given the provided history of facial trauma with right periorbital ecchymosis. Electronically Signed   By: Kellie Simmering DO   On: 07/10/2020 17:44    Procedures Procedures (including critical care time)  Medications Ordered in ED Medications  acetaminophen (TYLENOL) tablet 1,000 mg (1,000 mg Oral Given 07/10/20 1638)    ED Course  I have reviewed the triage vital signs and the nursing notes.  Pertinent labs & imaging results that were available during my care of the patient were reviewed by me and considered in my medical decision making (see chart for details).    MDM Rules/Calculators/A&P                          There is a questionable hemorrhage to the right inferior rectus muscle.  Pt has no pain with EOM.  Pt is stable for d/c.  Return if worse. No fx or internal bleeding. Final Clinical Impression(s) / ED Diagnoses Final diagnoses:  Fall, initial encounter  Contusion of face, initial encounter    Rx / DC Orders ED Discharge Orders    None       Isla Pence, MD 07/10/20 1753

## 2020-07-11 ENCOUNTER — Telehealth: Payer: Self-pay

## 2020-07-13 ENCOUNTER — Ambulatory Visit: Payer: BC Managed Care – PPO | Admitting: Nurse Practitioner

## 2020-07-13 ENCOUNTER — Encounter: Payer: Self-pay | Admitting: Family Medicine

## 2020-07-14 DIAGNOSIS — M25512 Pain in left shoulder: Secondary | ICD-10-CM | POA: Diagnosis not present

## 2020-07-16 ENCOUNTER — Other Ambulatory Visit: Payer: Self-pay | Admitting: Internal Medicine

## 2020-07-20 ENCOUNTER — Telehealth: Payer: Self-pay | Admitting: *Deleted

## 2020-07-20 DIAGNOSIS — M67814 Other specified disorders of tendon, left shoulder: Secondary | ICD-10-CM | POA: Diagnosis not present

## 2020-07-20 DIAGNOSIS — Z79899 Other long term (current) drug therapy: Secondary | ICD-10-CM

## 2020-07-20 DIAGNOSIS — M25512 Pain in left shoulder: Secondary | ICD-10-CM | POA: Diagnosis not present

## 2020-07-20 DIAGNOSIS — M7582 Other shoulder lesions, left shoulder: Secondary | ICD-10-CM | POA: Insufficient documentation

## 2020-07-20 HISTORY — DX: Other shoulder lesions, left shoulder: M75.82

## 2020-07-20 NOTE — Telephone Encounter (Signed)
Patient came into the office today to let us know she stopped taking her pravastatin and it has made a difference in her symptoms. Will make dr hilty aware.

## 2020-07-20 NOTE — Telephone Encounter (Signed)
Thanks Hipolito Bayley, would recommend stop pravastatin and start zetia 10 mg daily - repeat lipid in 3 months.  Dr Lemmie Evens

## 2020-07-21 MED ORDER — EZETIMIBE 10 MG PO TABS: 10.0000 mg | ORAL_TABLET | Freq: Every day | ORAL | 2 refills | Status: DC

## 2020-07-21 NOTE — Telephone Encounter (Signed)
Spoke with patient, advised patient of Dr. Lysbeth Penner recommendation of starting Zetia 10mg  daily. Patient does state she had muscle aches with Vytorin in the past. Checked with pharmacy who states that it was most likely related to the statin in Vytorin. Advised patient that Zetia is not a statin medication. Patient verbalized understanding and would like to try this. Prescription sent to pharmacy listed. Advised patient to let us know if she has any issues. Advised patient to return to office in 3 months for repeat Lipid panel blood work. Patient verbalized understanding.

## 2020-07-24 ENCOUNTER — Encounter (INDEPENDENT_AMBULATORY_CARE_PROVIDER_SITE_OTHER): Payer: Self-pay | Admitting: Gastroenterology

## 2020-07-24 ENCOUNTER — Other Ambulatory Visit: Payer: Self-pay

## 2020-07-24 ENCOUNTER — Ambulatory Visit (INDEPENDENT_AMBULATORY_CARE_PROVIDER_SITE_OTHER): Payer: BC Managed Care – PPO | Admitting: Gastroenterology

## 2020-07-24 VITALS — BP 120/78 | HR 64 | Temp 99.0°F | Ht 66.0 in | Wt 176.1 lb

## 2020-07-24 DIAGNOSIS — K219 Gastro-esophageal reflux disease without esophagitis: Secondary | ICD-10-CM

## 2020-07-24 DIAGNOSIS — R195 Other fecal abnormalities: Secondary | ICD-10-CM | POA: Diagnosis not present

## 2020-07-24 NOTE — Progress Notes (Addendum)
Maylon Peppers, M.D. Gastroenterology & Hepatology [facility] [address] [place], [place] [address] Primary Care Physician: Janora Norlander, DO [place] [id number]  Referring [id number] Sherran Needs, MD  I will communicate my assessment and recommendations to the referring MD via EMR. Note: Occasional unusual wording and randomly placed punctuation marks may result from the use of speech recognition technology to transcribe this document"  Chief Complaint: Yellow stools  History of Present Illness: [name] is a 69 y.o. female with PMH asthma, IBS, skin cancer, HLD, depression, who presents for evaluation of yellow colored stools.  Patient states that since the beginning of Summer 2021 she has presented yellow colored stools. She believes she took some antibiotic before her symptoms happened, but she cannot recall which one. At that time she had several loose BMs a day without any blood.  These improve on its own, and currently she is having 1-4 BMs per day. She has soft BMs every day.  However, the patient states that since then her bowel movements have had a yellow color.  Has not noticed any clay colored or white stools.  The patient denies having any nausea, vomiting, fever, chills, hematochezia, melena, hematemesis, abdominal distention, abdominal pain, diarrhea, jaundice, pruritus or weight loss.  In terms of her GERD, patient states that if she does not take Nexium she will have recurrent heartburn.  However, if compliant her symptoms are well controlled and denies having any heartburn episodes. No dysphagia or odynophagia.  She was told she had IBS many years ago.  Last imaging available form 02/23/2018 - fatty liver infiltration, splenomegaly  Upon review of her medical chart, she has labs from 04/05/2020 which showed a CMP with normal liver enzymes showing total bilirubin of 0.6, AST 28, ALT 21, albumin 4.9,  alkaline phosphatase 77, she also had normal CBC on 07/13/2019.  Last Colonoscopy: 2016 - 7 mm polyp - TA, repeat in 7 years  FHx: neg for any gastrointestinal/liver disease, mother breast cancer, uncle prostate cancer, ovarian cancer grandmother Social: neg smoking, alcohol or illicit drug use Surgical: bladder sling  Past Medical History: Past Medical History:  Diagnosis Date  . Allergy   . Asthma   . Back pain   . Cancer ([place])    skin cancer  . Depression   . Hyperlipidemia     Past Surgical History: Past Surgical History:  Procedure Laterality Date  . bladder mesh    . COLONOSCOPY N/A 07/12/2015   Procedure: COLONOSCOPY;  Surgeon: Rogene Houston, MD;  Location[facility];  Service: Endoscopy;  Laterality: N/A;  1030  . NM MYOCAR PERF WALL MOTION  02/2007   dipyridamole myoview; EF 70%, no wall motion abnormalities, no inducible ischemia, low risk   . OVARIAN CYST REMOVAL  1987  . TONSILECTOMY/ADENOIDECTOMY WITH MYRINGOTOMY  1958  . TONSILLECTOMY    . TRANSTHORACIC ECHOCARDIOGRAM  02/2007   RV mildly dlated; LV normal in size; mild MR with thickened MV leaflets; mild TR; mild pulm valve regurg  . VAGINAL DELIVERY     x3    Family History: Family History  Problem Relation Age of Onset  . Heart disease Mother   . Kidney disease Mother   . Breast cancer Mother   . Coronary artery disease Mother        CABG at age 31  . Dementia Father   . Stroke Father   . Stroke Maternal Grandmother   .  Cancer Maternal Grandfather   . Cancer Paternal Grandmother   . Kidney disease Paternal Grandfather     Social History: Social History   Tobacco Use  Smoking Status Never Smoker  Smokeless Tobacco Never Used   Social History   Substance and Sexual Activity  Alcohol Use No   Social History   Substance and Sexual Activity  Drug Use No    Allergies: Allergies  Allergen Reactions  . Bee Venom Swelling  . Crestor [Rosuvastatin] Other (See Comments)    Myalgias   . Erythromycin Itching, Rash and Other (See Comments)  . Latex Itching and Rash    rash  . Rosuvastatin Calcium Other (See Comments)    Myalgias  . Pravastatin Other (See Comments)    Pain in joints.  Tori Milks [Naproxen Sodium]     Gastritis  . Tetracyclines & Related Other (See Comments)    unknown  . Vytorin [Ezetimibe-Simvastatin] Other (See Comments)    unknown    Medications: Current Outpatient Medications  Medication Sig Dispense Refill  . albuterol (PROVENTIL HFA;VENTOLIN HFA) 108 (90 Base) MCG/ACT inhaler Inhale 2 puffs into the lungs every 4 (four) hours as needed for wheezing or shortness of breath. 1 Inhaler 0  . allopurinol (ZYLOPRIM) 300 MG tablet Take 1 tablet (300 mg total) by mouth every evening. 90 tablet 3  . atenolol-chlorthalidone (TENORETIC) 50-25 MG tablet TAKE 1 TABLET BY MOUTH DAILY 90 tablet 3  . Biotin 1 MG CAPS Take by mouth.    . budesonide (PULMICORT) 180 MCG/ACT inhaler Inhale 2 puffs into the lungs 2 (two) times daily.     . cetirizine (ZYRTEC ALLERGY) 10 MG tablet Take 1 tablet by mouth daily.    . cholecalciferol (VITAMIN D3) 25 MCG (1000 UT) tablet Take 1,000 Units by mouth daily.    Marland Kitchen esomeprazole (NEXIUM) 20 MG capsule Take 20 mg by mouth daily at 12 noon.    . ezetimibe (ZETIA) 10 MG tablet Take 1 tablet (10 mg total) by mouth daily. 90 tablet 2  . ferrous sulfate 325 (65 FE) MG EC tablet Take 325 mg by mouth daily with breakfast.     . montelukast (SINGULAIR) 10 MG tablet TAKE 1 TABLET(10 MG) BY MOUTH EVERY MORNING 90 tablet 0  . Multiple Vitamin (MULTIVITAMIN) capsule Take 1 capsule by mouth daily.     . sertraline (ZOLOFT) 25 MG tablet Take 25 mg by mouth daily.   1   No current facility-administered medications for this visit.    Review of Systems: GENERAL: negative for malaise, night sweats HEENT: No changes in hearing or vision, no nose bleeds or other nasal problems. NECK: Negative for lumps, goiter, pain and significant neck  swelling RESPIRATORY: Negative for cough, wheezing CARDIOVASCULAR: Negative for chest pain, leg swelling, palpitations, orthopnea GI: SEE HPI MUSCULOSKELETAL: Negative for joint pain or swelling, back pain, and muscle pain. SKIN: Negative for lesions, rash PSYCH: Negative for sleep disturbance, mood disorder and recent psychosocial stressors. HEMATOLOGY Negative for prolonged bleeding, bruising easily, and swollen nodes. ENDOCRINE: Negative for cold or heat intolerance, polyuria, polydipsia and goiter. NEURO: negative for tremor, gait imbalance, syncope and seizures. The remainder of the review of systems is noncontributory.   Physical Exam: BP 120/78 (BP Location: Right Arm, Patient Position: Sitting, Cuff Size: Large)   Pulse 64   Temp 99 F (37.2 C) (Oral)   Ht 5\' 6"  (1.676 m)   Wt 176 lb 1.6 oz (79.9 kg)   BMI 28.42 kg/m  GENERAL: The  patient is AO x3, in no acute distress. HEENT: Head is normocephalic and atraumatic. EOMI are intact. Mouth is well hydrated and without lesions. NECK: Supple. No masses LUNGS: Clear to auscultation. No presence of rhonchi/wheezing/rales. Adequate chest expansion HEART: RRR, normal s1 and s2. ABDOMEN: Soft, nontender, no guarding, no peritoneal signs, and nondistended. BS +. No masses. EXTREMITIES: Without any cyanosis, clubbing, rash, lesions or edema. NEUROLOGIC: AOx3, no focal motor deficit. SKIN: no jaundice, no rashes   Imaging/Labs: as above  I personally reviewed and interpreted the available labs, imaging and endoscopic files.  Impression and Plan: [name] is a 69 y.o. female with PMH asthma, IBS, skin cancer, HLD, depression, who presents for evaluation of yellow colored stools.  The patient has had macroscopic changes in the color of her stools and some soft consistency of her bowel movements, but has not presented any other concomitant symptoms.  I explained to her that the color of her stools are likely related to the type  of food she eats or her microbiota, but she has not presented any acholia or presence of melena/hematochezia that would warrant any further investigation.  We will recheck a CMP and CBC, but fortunately she has had normal labs in the last 6 months, I consider less likely she has any degree of obstructive disease in her biliary system leading to a change in her bowel movements.  The patient is not interested in taking any bulking agent to improve her bowel movement consistency, which I agree is adequate.  Her GERD is adequately controlled and she should continue on her current PPI dosage.  - Patient reassured - Check CBC and CMP today -Continue Nexium 20 mg every day  All questions were answered.      Maylon Peppers, MD Gastroenterology and Hepatology [facility]

## 2020-07-24 NOTE — Patient Instructions (Signed)
Perform blood workup  

## 2020-07-25 LAB — COMPREHENSIVE METABOLIC PANEL
AG Ratio: 2.3 (calc) (ref 1.0–2.5)
ALT: 18 U/L (ref 6–29)
AST: 19 U/L (ref 10–35)
Albumin: 4.8 g/dL (ref 3.6–5.1)
Alkaline phosphatase (APISO): 66 U/L (ref 37–153)
BUN: 18 mg/dL (ref 7–25)
CO2: 30 mmol/L (ref 20–32)
Calcium: 9.5 mg/dL (ref 8.6–10.4)
Chloride: 99 mmol/L (ref 98–110)
Creat: 0.85 mg/dL (ref 0.50–0.99)
Globulin: 2.1 g/dL (calc) (ref 1.9–3.7)
Glucose, Bld: 91 mg/dL (ref 65–139)
Potassium: 3.6 mmol/L (ref 3.5–5.3)
Sodium: 141 mmol/L (ref 135–146)
Total Bilirubin: 0.5 mg/dL (ref 0.2–1.2)
Total Protein: 6.9 g/dL (ref 6.1–8.1)

## 2020-07-25 LAB — CBC WITH DIFFERENTIAL/PLATELET
Absolute Monocytes: 697 cells/uL (ref 200–950)
Basophils Absolute: 73 cells/uL (ref 0–200)
Basophils Relative: 0.7 %
Eosinophils Absolute: 146 cells/uL (ref 15–500)
Eosinophils Relative: 1.4 %
HCT: 43.1 % (ref 35.0–45.0)
Hemoglobin: 14.9 g/dL (ref 11.7–15.5)
Lymphs Abs: 2600 cells/uL (ref 850–3900)
MCH: 30.2 pg (ref 27.0–33.0)
MCHC: 34.6 g/dL (ref 32.0–36.0)
MCV: 87.2 fL (ref 80.0–100.0)
MPV: 10.3 fL (ref 7.5–12.5)
Monocytes Relative: 6.7 %
Neutro Abs: 6885 cells/uL (ref 1500–7800)
Neutrophils Relative %: 66.2 %
Platelets: 205 10*3/uL (ref 140–400)
RBC: 4.94 10*6/uL (ref 3.80–5.10)
RDW: 13.2 % (ref 11.0–15.0)
Total Lymphocyte: 25 %
WBC: 10.4 10*3/uL (ref 3.8–10.8)

## 2020-07-25 NOTE — Progress Notes (Signed)
Hi Crystal,  Can you please call the patient and let her know that her liver enzymes were completely normal? No further testing or follow up is warranted.  Thanks,  Maylon Peppers, MD Gastroenterology and Hepatology [facility]

## 2020-08-14 ENCOUNTER — Other Ambulatory Visit: Payer: Self-pay

## 2020-08-14 ENCOUNTER — Ambulatory Visit: Payer: BC Managed Care – PPO | Admitting: Family Medicine

## 2020-08-14 VITALS — BP 118/77 | HR 66 | Temp 97.1°F | Ht 66.0 in | Wt 178.0 lb

## 2020-08-14 DIAGNOSIS — Z23 Encounter for immunization: Secondary | ICD-10-CM

## 2020-08-14 DIAGNOSIS — I10 Essential (primary) hypertension: Secondary | ICD-10-CM

## 2020-08-14 DIAGNOSIS — E782 Mixed hyperlipidemia: Secondary | ICD-10-CM | POA: Diagnosis not present

## 2020-08-14 DIAGNOSIS — R7309 Other abnormal glucose: Secondary | ICD-10-CM

## 2020-08-14 LAB — BAYER DCA HB A1C WAIVED: HB A1C (BAYER DCA - WAIVED): 5.7 % (ref <7.0)

## 2020-08-14 MED ORDER — MONTELUKAST SODIUM 10 MG PO TABS
ORAL_TABLET | ORAL | 3 refills | Status: DC
Start: 2020-08-14 — End: 2021-06-07

## 2020-08-14 MED ORDER — ALLOPURINOL 300 MG PO TABS
300.0000 mg | ORAL_TABLET | Freq: Every evening | ORAL | 3 refills | Status: DC
Start: 2020-08-14 — End: 2021-06-08

## 2020-08-14 NOTE — Progress Notes (Signed)
Subjective: CC: HTN, HLD PCP: Janora Norlander, DO QIW:[id number] [name] is a 69 y.o. female presenting to clinic today for:  1.  Hypertension with hyperlipidemia Patient was last seen about 6 months ago.  She had lipid panel obtained by her cardiologist and did have mild elevation in her LDL.she was switched off of her previous cholesterol medicine onto Zetia because she was having quite a bit of arthralgia. She was continued on atenolol-chlorthalidone.  2. Elevated serum glucose Patient noted to have elevated serum glucose. Her last A1c in October 2020 was technically in the diabetic range. She has been reading a couple books about diabetes, there is a strong family history of diabetes, and ways to prevent it. She was exercising regularly until it got really cold and she has since discontinued. She does not report any significant modifications to diet but acknowledges that she does need to make changes because she is not active during the daytime. She does report polyuria and polydipsia  Lab Results  Component Value Date   HGBA1C 6.7 07/13/2019    ROS: Per HPI  Allergies  Allergen Reactions  . Bee Venom Swelling  . Crestor [Rosuvastatin] Other (See Comments)    Myalgias  . Erythromycin Itching, Rash and Other (See Comments)  . Latex Itching and Rash    rash  . Rosuvastatin Calcium Other (See Comments)    Myalgias  . Pravastatin Other (See Comments)    Pain in joints.  Tori Milks [Naproxen Sodium]     Gastritis  . Tetracyclines & Related Other (See Comments)    unknown  . Vytorin [Ezetimibe-Simvastatin] Other (See Comments)    unknown   Past Medical History:  Diagnosis Date  . Allergy   . Asthma   . Back pain   . Cancer ([place])    skin cancer  . Depression   . Hyperlipidemia     Current Outpatient Medications:  .  albuterol (PROVENTIL HFA;VENTOLIN HFA) 108 (90 Base) MCG/ACT inhaler, Inhale 2 puffs into the lungs every 4 (four) hours as needed for wheezing or shortness  of breath., Disp: 1 Inhaler, Rfl: 0 .  allopurinol (ZYLOPRIM) 300 MG tablet, Take 1 tablet (300 mg total) by mouth every evening., Disp: 90 tablet, Rfl: 3 .  atenolol-chlorthalidone (TENORETIC) 50-25 MG tablet, TAKE 1 TABLET BY MOUTH DAILY, Disp: 90 tablet, Rfl: 3 .  Biotin 1 MG CAPS, Take by mouth., Disp: , Rfl:  .  budesonide (PULMICORT) 180 MCG/ACT inhaler, Inhale 2 puffs into the lungs 2 (two) times daily. , Disp: , Rfl:  .  cetirizine (ZYRTEC ALLERGY) 10 MG tablet, Take 1 tablet by mouth daily., Disp: , Rfl:  .  cholecalciferol (VITAMIN D3) 25 MCG (1000 UT) tablet, Take 1,000 Units by mouth daily., Disp: , Rfl:  .  esomeprazole (NEXIUM) 20 MG capsule, Take 20 mg by mouth daily at 12 noon., Disp: , Rfl:  .  ezetimibe (ZETIA) 10 MG tablet, Take 1 tablet (10 mg total) by mouth daily., Disp: 90 tablet, Rfl: 2 .  ferrous sulfate 325 (65 FE) MG EC tablet, Take 325 mg by mouth daily with breakfast. , Disp: , Rfl:  .  montelukast (SINGULAIR) 10 MG tablet, TAKE 1 TABLET(10 MG) BY MOUTH EVERY MORNING, Disp: 90 tablet, Rfl: 0 .  Multiple Vitamin (MULTIVITAMIN) capsule, Take 1 capsule by mouth daily. , Disp: , Rfl:  .  sertraline (ZOLOFT) 25 MG tablet, Take 25 mg by mouth daily. , Disp: , Rfl: 1 Social History  Socioeconomic History  . Marital status: Married    Spouse name: Not on file  . Number of children: Not on file  . Years of education: Not on file  . Highest education level: Not on file  Occupational History  . Not on file  Tobacco Use  . Smoking status: Never Smoker  . Smokeless tobacco: Never Used  Vaping Use  . Vaping Use: Never used  Substance and Sexual Activity  . Alcohol use: No  . Drug use: No  . Sexual activity: Not on file  Other Topics Concern  . Not on file  Social History Narrative  . Not on file   Social Determinants of Health   Financial Resource Strain:   . Difficulty of Paying Living Expenses: Not on file  Food Insecurity:   . Worried About Sales executive in the Last Year: Not on file  . Ran Out of Food in the Last Year: Not on file  Transportation Needs:   . Lack of Transportation (Medical): Not on file  . Lack of Transportation (Non-Medical): Not on file  Physical Activity:   . Days of Exercise per Week: Not on file  . Minutes of Exercise per Session: Not on file  Stress:   . Feeling of Stress : Not on file  Social Connections:   . Frequency of Communication with Friends and Family: Not on file  . Frequency of Social Gatherings with Friends and Family: Not on file  . Attends Religious Services: Not on file  . Active Member of Clubs or Organizations: Not on file  . Attends Archivist Meetings: Not on file  . Marital Status: Not on file  Intimate Partner Violence:   . Fear of Current or Ex-Partner: Not on file  . Emotionally Abused: Not on file  . Physically Abused: Not on file  . Sexually Abused: Not on file   Family History  Problem Relation Age of Onset  . Heart disease Mother   . Kidney disease Mother   . Breast cancer Mother   . Coronary artery disease Mother        CABG at age 47  . Dementia Father   . Stroke Father   . Stroke Maternal Grandmother   . Cancer Maternal Grandfather   . Cancer Paternal Grandmother   . Kidney disease Paternal Grandfather     Objective: Office vital signs reviewed. BP 118/77   Pulse 66   Temp (!) 97.1 F (36.2 C) (Temporal)   Ht 5\' 6"  (1.676 m)   Wt 178 lb (80.7 kg)   SpO2 99%   BMI 28.73 kg/m   Physical Examination:  General: Awake, alert, well nourished, No acute distress HEENT: Normal, sclera white, MMM Cardio: regular rate and rhythm, S1S2 heard, no murmurs appreciated Pulm: clear to auscultation bilaterally, no wheezes, rhonchi or rales; normal work of breathing on room air Extremities: warm, well perfused, No edema, cyanosis or clubbing; +2 pulses bilaterally MSK: normal gait and station  Assessment/ Plan: 69 y.o. female   Essential  hypertension  Mixed hyperlipidemia  Elevated hemoglobin A1c - Plan: Bayer DCA Hb A1c Waived  Blood pressures well controlled. Continue current regimen. I reviewed her last lipid panel by her cardiologist. Too soon to collect lipid panel but she will plan to do this in the next couple of months. Additionally, blood sugar was noted to be elevated in October and I would like to repeat her A1c since this was technically diabetic range. If her  A1c remains at 6.5 or above, she will unfortunately gain the diagnosis of type 2 diabetes and we will need to proceed with significant diet modification and increase in exercise. Patient aware of recommendations and I will contact her once this result is available  Influenza vaccination administered  No orders of the defined types were placed in this encounter.  Meds ordered this encounter  Medications  . allopurinol (ZYLOPRIM) 300 MG tablet    Sig: Take 1 tablet (300 mg total) by mouth every evening.    Dispense:  90 tablet    Refill:  3  . montelukast (SINGULAIR) 10 MG tablet    Sig: TAKE 1 TABLET(10 MG) BY MOUTH EVERY MORNING    Dispense:  90 tablet    Refill:  [place], DO [place] [phone]

## 2020-08-14 NOTE — Patient Instructions (Signed)
Checking your sugar since it was high this time last year.

## 2020-08-18 ENCOUNTER — Other Ambulatory Visit: Payer: Self-pay | Admitting: *Deleted

## 2020-08-18 MED ORDER — LANCETS 30G MISC: 3 refills | Status: DC

## 2020-08-18 MED ORDER — CONTOUR NEXT TEST VI STRP: ORAL_STRIP | 3 refills | Status: DC

## 2020-09-14 DIAGNOSIS — H00024 Hordeolum internum left upper eyelid: Secondary | ICD-10-CM | POA: Diagnosis not present

## 2020-09-25 ENCOUNTER — Other Ambulatory Visit: Payer: Self-pay | Admitting: *Deleted

## 2020-09-25 DIAGNOSIS — Z79899 Other long term (current) drug therapy: Secondary | ICD-10-CM

## 2020-10-05 ENCOUNTER — Telehealth: Payer: Self-pay | Admitting: Internal Medicine

## 2020-10-05 NOTE — Telephone Encounter (Signed)
LM2CB left detailed message: Dr Debara Pickett would like to see pt in the office. I scheduled an appointment in March at 2pm. Left detailed message about details of this message and to call back if this day/time is not good for her

## 2020-10-05 NOTE — Telephone Encounter (Signed)
New message:     Patient calling stating that she was on a new medication. Patient do not know the name of it. It was a new one. Please call patient and advise.

## 2020-10-05 NOTE — Telephone Encounter (Signed)
Called pt she states that in November Dr Debara Pickett told her to start Zetia 10mg . She states that took this medication for "about a month" and she started having leg and foot cramping. so she stopped the Zetia for 2 weeks. The pain stopped so she restarted taking the Zetia after a few days the pain started again. So she would like another medication instead of this medication. Please advise

## 2020-10-05 NOTE — Telephone Encounter (Signed)
Options are limited for her - would need to see her back to discuss. I don't think she has a recall until August, so will need something sooner.  Dr Lemmie Evens

## 2020-11-08 DIAGNOSIS — M9901 Segmental and somatic dysfunction of cervical region: Secondary | ICD-10-CM | POA: Diagnosis not present

## 2020-11-08 DIAGNOSIS — M62830 Muscle spasm of back: Secondary | ICD-10-CM | POA: Diagnosis not present

## 2020-11-08 DIAGNOSIS — M9902 Segmental and somatic dysfunction of thoracic region: Secondary | ICD-10-CM | POA: Diagnosis not present

## 2020-11-08 DIAGNOSIS — M9903 Segmental and somatic dysfunction of lumbar region: Secondary | ICD-10-CM | POA: Diagnosis not present

## 2020-11-13 DIAGNOSIS — M9902 Segmental and somatic dysfunction of thoracic region: Secondary | ICD-10-CM | POA: Diagnosis not present

## 2020-11-13 DIAGNOSIS — L570 Actinic keratosis: Secondary | ICD-10-CM | POA: Diagnosis not present

## 2020-11-13 DIAGNOSIS — M9903 Segmental and somatic dysfunction of lumbar region: Secondary | ICD-10-CM | POA: Diagnosis not present

## 2020-11-13 DIAGNOSIS — C44311 Basal cell carcinoma of skin of nose: Secondary | ICD-10-CM | POA: Diagnosis not present

## 2020-11-13 DIAGNOSIS — M9901 Segmental and somatic dysfunction of cervical region: Secondary | ICD-10-CM | POA: Diagnosis not present

## 2020-11-13 DIAGNOSIS — M62830 Muscle spasm of back: Secondary | ICD-10-CM | POA: Diagnosis not present

## 2020-11-15 DIAGNOSIS — M9902 Segmental and somatic dysfunction of thoracic region: Secondary | ICD-10-CM | POA: Diagnosis not present

## 2020-11-15 DIAGNOSIS — M9903 Segmental and somatic dysfunction of lumbar region: Secondary | ICD-10-CM | POA: Diagnosis not present

## 2020-11-15 DIAGNOSIS — M62830 Muscle spasm of back: Secondary | ICD-10-CM | POA: Diagnosis not present

## 2020-11-15 DIAGNOSIS — M9901 Segmental and somatic dysfunction of cervical region: Secondary | ICD-10-CM | POA: Diagnosis not present

## 2020-11-16 DIAGNOSIS — M9903 Segmental and somatic dysfunction of lumbar region: Secondary | ICD-10-CM | POA: Diagnosis not present

## 2020-11-16 DIAGNOSIS — M9902 Segmental and somatic dysfunction of thoracic region: Secondary | ICD-10-CM | POA: Diagnosis not present

## 2020-11-16 DIAGNOSIS — M9901 Segmental and somatic dysfunction of cervical region: Secondary | ICD-10-CM | POA: Diagnosis not present

## 2020-11-16 DIAGNOSIS — M62830 Muscle spasm of back: Secondary | ICD-10-CM | POA: Diagnosis not present

## 2020-11-20 DIAGNOSIS — M9902 Segmental and somatic dysfunction of thoracic region: Secondary | ICD-10-CM | POA: Diagnosis not present

## 2020-11-20 DIAGNOSIS — M9901 Segmental and somatic dysfunction of cervical region: Secondary | ICD-10-CM | POA: Diagnosis not present

## 2020-11-20 DIAGNOSIS — M9903 Segmental and somatic dysfunction of lumbar region: Secondary | ICD-10-CM | POA: Diagnosis not present

## 2020-11-20 DIAGNOSIS — M62830 Muscle spasm of back: Secondary | ICD-10-CM | POA: Diagnosis not present

## 2020-11-22 DIAGNOSIS — M9903 Segmental and somatic dysfunction of lumbar region: Secondary | ICD-10-CM | POA: Diagnosis not present

## 2020-11-22 DIAGNOSIS — M9902 Segmental and somatic dysfunction of thoracic region: Secondary | ICD-10-CM | POA: Diagnosis not present

## 2020-11-22 DIAGNOSIS — M9901 Segmental and somatic dysfunction of cervical region: Secondary | ICD-10-CM | POA: Diagnosis not present

## 2020-11-22 DIAGNOSIS — M62830 Muscle spasm of back: Secondary | ICD-10-CM | POA: Diagnosis not present

## 2020-11-27 DIAGNOSIS — M9902 Segmental and somatic dysfunction of thoracic region: Secondary | ICD-10-CM | POA: Diagnosis not present

## 2020-11-27 DIAGNOSIS — M62830 Muscle spasm of back: Secondary | ICD-10-CM | POA: Diagnosis not present

## 2020-11-27 DIAGNOSIS — M9903 Segmental and somatic dysfunction of lumbar region: Secondary | ICD-10-CM | POA: Diagnosis not present

## 2020-11-27 DIAGNOSIS — M9901 Segmental and somatic dysfunction of cervical region: Secondary | ICD-10-CM | POA: Diagnosis not present

## 2020-11-29 DIAGNOSIS — M9903 Segmental and somatic dysfunction of lumbar region: Secondary | ICD-10-CM | POA: Diagnosis not present

## 2020-11-29 DIAGNOSIS — M9901 Segmental and somatic dysfunction of cervical region: Secondary | ICD-10-CM | POA: Diagnosis not present

## 2020-11-29 DIAGNOSIS — M9902 Segmental and somatic dysfunction of thoracic region: Secondary | ICD-10-CM | POA: Diagnosis not present

## 2020-11-29 DIAGNOSIS — M62830 Muscle spasm of back: Secondary | ICD-10-CM | POA: Diagnosis not present

## 2020-11-30 DIAGNOSIS — R5383 Other fatigue: Secondary | ICD-10-CM | POA: Diagnosis not present

## 2020-11-30 DIAGNOSIS — C44311 Basal cell carcinoma of skin of nose: Secondary | ICD-10-CM | POA: Diagnosis not present

## 2020-12-04 ENCOUNTER — Ambulatory Visit: Payer: BC Managed Care – PPO | Admitting: Internal Medicine

## 2020-12-04 DIAGNOSIS — Z79899 Other long term (current) drug therapy: Secondary | ICD-10-CM | POA: Diagnosis not present

## 2020-12-05 LAB — LIPID PANEL
Chol/HDL Ratio: 5.4 ratio — ABNORMAL HIGH (ref 0.0–4.4)
Cholesterol, Total: 238 mg/dL — ABNORMAL HIGH (ref 100–199)
HDL: 44 mg/dL (ref >39)
LDL Chol Calc (NIH): 157 mg/dL — ABNORMAL HIGH (ref 0–99)
Triglycerides: 201 mg/dL — ABNORMAL HIGH (ref 0–149)
VLDL Cholesterol Cal: 37 mg/dL (ref 5–40)

## 2020-12-11 ENCOUNTER — Encounter: Payer: Self-pay | Admitting: Internal Medicine

## 2020-12-11 ENCOUNTER — Other Ambulatory Visit: Payer: Self-pay

## 2020-12-11 ENCOUNTER — Ambulatory Visit: Payer: BC Managed Care – PPO | Admitting: Internal Medicine

## 2020-12-11 VITALS — BP 130/78 | HR 58 | Ht 66.0 in | Wt 183.0 lb

## 2020-12-11 DIAGNOSIS — E782 Mixed hyperlipidemia: Secondary | ICD-10-CM

## 2020-12-11 DIAGNOSIS — Z8249 Family history of ischemic heart disease and other diseases of the circulatory system: Secondary | ICD-10-CM | POA: Diagnosis not present

## 2020-12-11 DIAGNOSIS — M791 Myalgia, unspecified site: Secondary | ICD-10-CM | POA: Diagnosis not present

## 2020-12-11 DIAGNOSIS — T466X5A Adverse effect of antihyperlipidemic and antiarteriosclerotic drugs, initial encounter: Secondary | ICD-10-CM

## 2020-12-11 DIAGNOSIS — I10 Essential (primary) hypertension: Secondary | ICD-10-CM

## 2020-12-11 NOTE — Patient Instructions (Addendum)
Medication Instructions:  Dr. Debara Pickett advised to try (over-the-counter): Red Yeast Rice Cholestoff  *If you need a refill on your cardiac medications before your next appointment, please call your pharmacy*   Lab Work: FASTING lab work in 6 months  If you have labs (blood work) drawn today and your tests are completely normal, you will receive your results only by: Marland Kitchen MyChart Message (if you have MyChart) OR . A paper copy in the mail If you have any lab test that is abnormal or we need to change your treatment, we will call you to review the results.   Testing/Procedures: NONE   Follow-Up: At [facility], you and your health needs are our priority.  As part of our continuing mission to provide you with exceptional heart care, we have created designated Provider Care Teams.  These Care Teams include your primary Cardiologist (physician) and Advanced Practice Providers (APPs -  Physician Assistants and Nurse Practitioners) who all work together to provide you with the care you need, when you need it.  We recommend signing up for the patient portal called "MyChart".  Sign up information is provided on this After Visit Summary.  MyChart is used to connect with patients for Virtual Visits (Telemedicine).  Patients are able to view lab/test results, encounter notes, upcoming appointments, etc.  Non-urgent messages can be sent to your provider as well.   To learn more about what you can do with MyChart, go to [email or web address].    Your next appointment:   6 month(s)  The format for your next appointment:   In Person  Provider:   You may see Pixie Casino, MD or one of the following Advanced Practice Providers on your designated Care Team:    Almyra Deforest, PA-C  Fabian Sharp, PA-C or   Roby Lofts, [place]    Other Instructions

## 2020-12-11 NOTE — Progress Notes (Signed)
OFFICE NOTE  Chief Complaint:  Routine follow-up  Primary Care Physician: Janora Norlander, DO  HPI:  [name] is a 70 y.o. female Who I previously seen in 2011 for palpitations and dyslipidemia. She is a former patient of Dr. Janene Madeira. Her mother [name] was a patient of mine who died recently. I have not seen her since 2011 although she had an appointment scheduled in 2015 which was canceled. Recently she's noted that she's had some elevated blood pressures. She was preparing for a beach trip and had some chest discomfort. This lasted for 2 or 3 hours and was described as a vertical line down the center of her chest. It was more of a dull pressure and resolved spontaneously. She's not had any exertional symptoms however is limited by a pinched nerve in her low back. She did have a cane for which she used to walk today. She does not exercise regularly. She also mentioned an episode where she had about 8 hours of difficulty recalling names and some memory loss after a traumatic event shortly after her mother died. She had an extensive workup for this including an MRI of the brain and ultimately no clear cause was found. She reports very good control of her her palpitations.  11/21/2017  [name] returns today for follow-up.  Overall she is doing well.  She denies any chest pain or worsening shortness of breath.  Previously I had added chlorthalidone to her atenolol.  She is doing well with that and reports blood pressures in the 409W and 119 systolic at home.  Recently she had developed a cyst on her left middle finger.  She had surgery for that this week.  She has reported some fatigue and wonders whether or not that may be related to a B12 deficiency.  There are apparently some reports that B12 deficiency can lead to cysts such as when she had on her hand.  Additionally, we discussed her cholesterol today.  In the past she has been intolerant to rosuvastatin and Vytorin.  She  was placed on pravastatin by me for elevated LDL cholesterol close to 190.  She had a marked reduction in total cholesterol on it however may have had side effects.  She discontinued it over a year ago and is not restarted.  She has no known atherosclerotic cardiovascular disease.  In 2016 she had carotid Dopplers which were negative in the setting of a TIA.  The etiology of the TIA is not clear.  03/16/2018  [name] returns today for follow-up.  She underwent coronary artery calcium scoring which was reported as 0, however she was found to have a small subcentimeter nodule which will need follow-up with a repeat CT scan in a year.  Overall she is doing well.  She was confused and got her lipid profile a month earlier than her appointment.  At lipid profile showed total cholesterol 198, triglycerides 178, HDL 48 and LDL 114.  She therefore has had improvement on pravastatin.  We discussed whether or not she actually needs to be on statin therapy, interestingly since she has a 0 calcium score.  Her ten-year risk is actually quite low however one considers her 30-year risk which is reasonable given her age in the mid 68s, would argue for statin therapy provided her diet is optimized.  She seems to be tolerating pravastatin without side effects.  05/12/2019  [name] is seen today in routine follow-up.  Overall she seems  to be doing well.  She has been taking pravastatin without any side effects.  She is noted a marked improvement in her lipid profile.  Total cholesterol is now 204, triglycerides 142, HDL 48 and LDL 128.  We had suggested a goal LDL less than 100.  Although her LDL is a little higher, this may represent recent dietary changes.  Her calcium score was 0 suggesting low risk of cardiovascular events.  Blood pressure is well controlled today.  05/11/2020  [name] is seen today in follow-up.  Overall she seems to be doing well.  Most recently she had a lipid profile showing total cholesterol 187,  triglycerides 117, HDL 46 and LDL 116.  She has been intolerant in the past to Crestor and Vytorin both causing myalgias.  Currently she is on pravastatin 40 mg.  EKG shows normal sinus rhythm.  She denies chest pain or shortness of breath.  As mentioned above she had a low calcium score of 0.  I would recommend a target LDL less than 100.  12/12/2020  [name] is seen today in follow-up.  Overall she seems to be doing well.  Blood pressure was well controlled.  EKG shows a sinus bradycardia.  She had labs in July of last year showing total cholesterol 183, HDL 46, LDL 116 and triglycerides 117, slightly higher than ideal.  Her cholesterol was then repeated just last week showing total cholesterol 238, triglycerides 201, HDL 44 and LDL 157.  She is now not currently on statin therapy due to side effects.  She could also not tolerate ezetimibe.  PMHx:  Past Medical History:  Diagnosis Date  . Allergy   . Asthma   . Back pain   . Cancer ([place])    skin cancer  . Depression   . Hyperlipidemia     Past Surgical History:  Procedure Laterality Date  . bladder mesh    . COLONOSCOPY N/A 07/12/2015   Procedure: COLONOSCOPY;  Surgeon: Rogene Houston, MD;  Location: [facility];  Service: Endoscopy;  Laterality: N/A;  1030  . NM MYOCAR PERF WALL MOTION  02/2007   dipyridamole myoview; EF 70%, no wall motion abnormalities, no inducible ischemia, low risk   . OVARIAN CYST REMOVAL  1987  . TONSILECTOMY/ADENOIDECTOMY WITH MYRINGOTOMY  1958  . TONSILLECTOMY    . TRANSTHORACIC ECHOCARDIOGRAM  02/2007   RV mildly dlated; LV normal in size; mild MR with thickened MV leaflets; mild TR; mild pulm valve regurg  . VAGINAL DELIVERY     x3    FAMHx:  Family History  Problem Relation Age of Onset  . Heart disease Mother   . Kidney disease Mother   . Breast cancer Mother   . Coronary artery disease Mother        CABG at age 31  . Dementia Father   . Stroke Father   . Stroke Maternal Grandmother   .  Cancer Maternal Grandfather   . Cancer Paternal Grandmother   . Kidney disease Paternal Grandfather     SOCHx:   reports that she has never smoked. She has never used smokeless tobacco. She reports that she does not drink alcohol and does not use drugs.  ALLERGIES:  Allergies  Allergen Reactions  . Bee Venom Swelling  . Crestor [Rosuvastatin] Other (See Comments)    Myalgias  . Erythromycin Itching, Rash and Other (See Comments)  . Latex Itching and Rash    rash  . Rosuvastatin Calcium Other (See Comments)  Myalgias  . Pravastatin Other (See Comments)    Pain in joints.  Tori Milks [Naproxen Sodium]     Gastritis  . Tetracyclines & Related Other (See Comments)    unknown  . Vytorin [Ezetimibe-Simvastatin] Other (See Comments)    unknown    ROS: Pertinent items noted in HPI and remainder of comprehensive ROS otherwise negative.  HOME MEDS: Current Outpatient Medications on File Prior to Visit  Medication Sig Dispense Refill  . albuterol (PROVENTIL HFA;VENTOLIN HFA) 108 (90 Base) MCG/ACT inhaler Inhale 2 puffs into the lungs every 4 (four) hours as needed for wheezing or shortness of breath. 1 Inhaler 0  . allopurinol (ZYLOPRIM) 300 MG tablet Take 1 tablet (300 mg total) by mouth every evening. 90 tablet 3  . atenolol-chlorthalidone (TENORETIC) 50-25 MG tablet TAKE 1 TABLET BY MOUTH DAILY 90 tablet 3  . budesonide (PULMICORT) 180 MCG/ACT inhaler Inhale 2 puffs into the lungs 2 (two) times daily.    . cetirizine (ZYRTEC) 10 MG tablet Take 1 tablet by mouth daily.    . cholecalciferol (VITAMIN D3) 25 MCG (1000 UT) tablet Take 1,000 Units by mouth daily.    Marland Kitchen esomeprazole (NEXIUM) 20 MG capsule Take 20 mg by mouth daily at 12 noon.    Marland Kitchen glucose blood (CONTOUR NEXT TEST) test strip Test BS daily and as needed Dx R73.09 100 each 3  . Lancets 30G MISC Test BS daily and as needed Dx R73.09 100 each 3  . montelukast (SINGULAIR) 10 MG tablet TAKE 1 TABLET(10 MG) BY MOUTH EVERY MORNING  90 tablet 3  . Multiple Vitamin (MULTIVITAMIN) capsule Take 1 capsule by mouth daily.     . sertraline (ZOLOFT) 25 MG tablet Take 25 mg by mouth daily.   1   No current facility-administered medications on file prior to visit.    LABS/IMAGING: No results found for this or any previous visit (from the past 48 hour(s)). No results found.  WEIGHTS: Wt Readings from Last 3 Encounters:  12/11/20 183 lb (83 kg)  08/14/20 178 lb (80.7 kg)  07/24/20 176 lb 1.6 oz (79.9 kg)    VITALS: BP 130/78 (BP Location: Left Arm, Patient Position: Sitting, Cuff Size: Normal)   Pulse (!) 58   Ht 5\' 6"  (1.676 m)   Wt 183 lb (83 kg)   SpO2 96%   BMI 29.54 kg/m   EXAM: General appearance: alert and no distress Neck: no carotid bruit, no JVD and thyroid not enlarged, symmetric, no tenderness/mass/nodules Lungs: clear to auscultation bilaterally Heart: regular rate and rhythm Abdomen: soft, non-tender; bowel sounds normal; no masses,  no organomegaly Extremities: extremities normal, atraumatic, no cyanosis or edema Pulses: 2+ and symmetric Skin: Skin color, texture, turgor normal. No rashes or lesions Neurologic: Grossly normal Psych: Pleasant  EKG: Sinus bradycardia at 58-personally reviewed  ASSESSMENT: 1. Dyslipidemia, statin and ezetimibe intolerant - myalgias 2. Hypertension - uncontrolled 3. Palpitations 4. Family history of cardiovascular disease 5. 0 coronary calcium score (11/2017)  PLAN: 1.   [name] has had a elevation in her cholesterol but cannot tolerate statins or ezetimibe.  She is probably not going to qualify for PCSK9 inhibitor given no coronary calcium.  I recommended she try over-the-counter red yeast rice and cholesterol in combination with diet and exercise.  We should retest her lipids again in about 6 months.  Follow-up with me in 6 months or sooner as necessary.  Pixie Casino, MD, [facility], [place]  Director of the [place] of the American Board of Clinical Lipidology Attending Cardiologist  Direct Dial: [phone]  Fax: [phone]  Website:  [email or web address] 12/11/2020, 2:56 PM

## 2021-01-29 DIAGNOSIS — M9902 Segmental and somatic dysfunction of thoracic region: Secondary | ICD-10-CM | POA: Diagnosis not present

## 2021-01-29 DIAGNOSIS — M9903 Segmental and somatic dysfunction of lumbar region: Secondary | ICD-10-CM | POA: Diagnosis not present

## 2021-01-29 DIAGNOSIS — M9901 Segmental and somatic dysfunction of cervical region: Secondary | ICD-10-CM | POA: Diagnosis not present

## 2021-01-29 DIAGNOSIS — M62830 Muscle spasm of back: Secondary | ICD-10-CM | POA: Diagnosis not present

## 2021-02-19 DIAGNOSIS — M9903 Segmental and somatic dysfunction of lumbar region: Secondary | ICD-10-CM | POA: Diagnosis not present

## 2021-02-19 DIAGNOSIS — M9902 Segmental and somatic dysfunction of thoracic region: Secondary | ICD-10-CM | POA: Diagnosis not present

## 2021-02-19 DIAGNOSIS — M62830 Muscle spasm of back: Secondary | ICD-10-CM | POA: Diagnosis not present

## 2021-02-19 DIAGNOSIS — M9901 Segmental and somatic dysfunction of cervical region: Secondary | ICD-10-CM | POA: Diagnosis not present

## 2021-02-21 ENCOUNTER — Other Ambulatory Visit: Payer: Self-pay | Admitting: Radiology

## 2021-02-21 DIAGNOSIS — N6320 Unspecified lump in the left breast, unspecified quadrant: Secondary | ICD-10-CM

## 2021-02-23 DIAGNOSIS — G43109 Migraine with aura, not intractable, without status migrainosus: Secondary | ICD-10-CM | POA: Diagnosis not present

## 2021-03-01 ENCOUNTER — Ambulatory Visit
Admission: RE | Admit: 2021-03-01 | Discharge: 2021-03-01 | Disposition: A | Discharge status: Home / Self Care | Payer: BC Managed Care – PPO | Source: Ambulatory Visit | Attending: Radiology | Admitting: Radiology

## 2021-03-01 ENCOUNTER — Other Ambulatory Visit: Payer: Self-pay

## 2021-03-01 DIAGNOSIS — N6320 Unspecified lump in the left breast, unspecified quadrant: Secondary | ICD-10-CM

## 2021-03-01 DIAGNOSIS — R922 Inconclusive mammogram: Secondary | ICD-10-CM | POA: Diagnosis not present

## 2021-03-01 DIAGNOSIS — N6489 Other specified disorders of breast: Secondary | ICD-10-CM | POA: Diagnosis not present

## 2021-03-05 ENCOUNTER — Encounter: Payer: Self-pay | Admitting: Neurology

## 2021-03-05 DIAGNOSIS — M9903 Segmental and somatic dysfunction of lumbar region: Secondary | ICD-10-CM | POA: Diagnosis not present

## 2021-03-05 DIAGNOSIS — M9901 Segmental and somatic dysfunction of cervical region: Secondary | ICD-10-CM | POA: Diagnosis not present

## 2021-03-05 DIAGNOSIS — M62830 Muscle spasm of back: Secondary | ICD-10-CM | POA: Diagnosis not present

## 2021-03-05 DIAGNOSIS — M9902 Segmental and somatic dysfunction of thoracic region: Secondary | ICD-10-CM | POA: Diagnosis not present

## 2021-03-22 NOTE — Progress Notes (Signed)
NEUROLOGY CONSULTATION NOTE  WYNEMA GAROUTTE MRN: [id number] DOB: 19-Jan-1951  Referring provider: Julian Reil, MD Primary care provider: Ronnie Doss, DO  Reason for consult:  ocular migraine  Assessment/Plan:   Possible migraine aura, although unusual due to different semiologies and no prior history of migraines. Transient global amnesia - second event Daily dizziness, possibly migraine-related Each of these symptoms may be triggered/aggravated by underlying emotional stress.  Will check MRI of brain with and without contrast Will check EEG Increase sertraline to 50mg  daily, which may be helpful in treating dizziness and migraine Follow up after testing   Subjective:  [name] is a 70 year old female who presents for ocular migraines.  History supplemented by referring provider's note.  MRI of brain from March 2016 personally reviewed.  On 6/8, she was at work when she suddenly had floaters in her vision followed by a mild headache.  The next day, she developed a crescent shape in the left side of her visual field followed by mild headache.  The following day, she saw a multicolor saw-shape in her visual field with mild headache and dizziness.  The visual symptoms each lasted about 5 minutes and headache lasted about 30 minutes.  Since then, she has had daily dizziness lasting several hours at a time.  It is a feeling of moving around in her head but no room spinning or nausea.  Last week, she had an episode of memory loss lasting several hours, similar to when she had transient global amnesia in March 2016 - at that time, she had trouble remembering how to do tasks at work, trouble remembering her brother's phone number and names of her pets.  She was hospitalized with negative workup, including brain MRI and EEG.  She denies history of migraines.  She reports emotional stress related to preparing to retire as well as strained relationship with her daughter.  She reports  that she always had difficulty handling stress.    PAST MEDICAL HISTORY: Past Medical History:  Diagnosis Date   Allergy    Asthma    Back pain    Cancer ([place])    skin cancer   Depression    Hyperlipidemia     PAST SURGICAL HISTORY: Past Surgical History:  Procedure Laterality Date   bladder mesh     COLONOSCOPY N/A 07/12/2015   Procedure: COLONOSCOPY;  Surgeon: Rogene Houston, MD;  Location: [facility];  Service: Endoscopy;  Laterality: N/A;  [place]  02/2007   dipyridamole myoview; EF 70%, no wall motion abnormalities, no inducible ischemia, low risk    OVARIAN CYST REMOVAL  1987   TONSILECTOMY/ADENOIDECTOMY WITH MYRINGOTOMY  1958   TONSILLECTOMY     TRANSTHORACIC ECHOCARDIOGRAM  02/2007   RV mildly dlated; LV normal in size; mild MR with thickened MV leaflets; mild TR; mild pulm valve regurg   VAGINAL DELIVERY     x3    MEDICATIONS: Current Outpatient Medications on File Prior to Visit  Medication Sig Dispense Refill   albuterol (PROVENTIL HFA;VENTOLIN HFA) 108 (90 Base) MCG/ACT inhaler Inhale 2 puffs into the lungs every 4 (four) hours as needed for wheezing or shortness of breath. 1 Inhaler 0   allopurinol (ZYLOPRIM) 300 MG tablet Take 1 tablet (300 mg total) by mouth every evening. 90 tablet 3   atenolol-chlorthalidone (TENORETIC) 50-25 MG tablet TAKE 1 TABLET BY MOUTH DAILY 90 tablet 3   budesonide (PULMICORT) 180 MCG/ACT inhaler Inhale 2  puffs into the lungs 2 (two) times daily.     cetirizine (ZYRTEC) 10 MG tablet Take 1 tablet by mouth daily.     cholecalciferol (VITAMIN D3) 25 MCG (1000 UT) tablet Take 1,000 Units by mouth daily.     esomeprazole (NEXIUM) 20 MG capsule Take 20 mg by mouth daily at 12 noon.     glucose blood (CONTOUR NEXT TEST) test strip Test BS daily and as needed Dx R73.09 100 each 3   Lancets 30G MISC Test BS daily and as needed Dx R73.09 100 each 3   montelukast (SINGULAIR) 10 MG tablet TAKE 1 TABLET(10 MG) BY  MOUTH EVERY MORNING 90 tablet 3   Multiple Vitamin (MULTIVITAMIN) capsule Take 1 capsule by mouth daily.      sertraline (ZOLOFT) 25 MG tablet Take 25 mg by mouth daily.   1   No current facility-administered medications on file prior to visit.    ALLERGIES: Allergies  Allergen Reactions   Bee Venom Swelling   Crestor [Rosuvastatin] Other (See Comments)    Myalgias   Erythromycin Itching, Rash and Other (See Comments)   Latex Itching and Rash    rash   Rosuvastatin Calcium Other (See Comments)    Myalgias   Pravastatin Other (See Comments)    Pain in joints.   Zetia [Ezetimibe]    Aleve [Naproxen Sodium]     Gastritis   Tetracyclines & Related Other (See Comments)    unknown   Vytorin [Ezetimibe-Simvastatin] Other (See Comments)    unknown    FAMILY HISTORY: Family History  Problem Relation Age of Onset   Heart disease Mother    Kidney disease Mother    Breast cancer Mother    Coronary artery disease Mother        CABG at age 73   Dementia Father    Stroke Father    Stroke Maternal Grandmother    Cancer Maternal Grandfather    Cancer Paternal Grandmother    Kidney disease Paternal Grandfather     Objective:  Blood pressure 117/76, pulse 68, height 5\' 6"  (1.676 m), weight 177 lb 2 oz (80.3 kg), SpO2 97 %. General: No acute distress.  Patient appears well-groomed.   Head:  Normocephalic/atraumatic Eyes:  fundi examined but not visualized Neck: supple, no paraspinal tenderness, full range of motion Back: No paraspinal tenderness Heart: regular rate and rhythm Lungs: Clear to auscultation bilaterally. Vascular: No carotid bruits. Neurological Exam: Mental status: alert and oriented to person, place, and time, recent and remote memory intact, fund of knowledge intact, attention and concentration intact, speech fluent and not dysarthric, language intact. Cranial nerves: CN I: not tested CN II: pupils equal, round and reactive to light, visual fields intact CN  III, IV, VI:  full range of motion, no nystagmus, no ptosis CN V: facial sensation intact. CN VII: upper and lower face symmetric CN VIII: hearing intact CN IX, X: gag intact, uvula midline CN XI: sternocleidomastoid and trapezius muscles intact CN XII: tongue midline Bulk & Tone: normal, no fasciculations. Motor:  muscle strength 5/5 throughout Sensation:  Pinprick, temperature and vibratory sensation intact. Deep Tendon Reflexes:  2+ throughout,  toes downgoing.   Finger to nose testing:  Without dysmetria.   Heel to shin:  Without dysmetria.   Gait:  Normal station and stride.  Romberg negative.    Thank you for allowing me to take part in the care of this patient.  Metta Clines, DO  CC:  Julian Reil, MD  Ronnie Doss, DO

## 2021-03-26 ENCOUNTER — Ambulatory Visit: Payer: BC Managed Care – PPO | Admitting: Neurology

## 2021-03-26 ENCOUNTER — Encounter: Payer: Self-pay | Admitting: Neurology

## 2021-03-26 ENCOUNTER — Other Ambulatory Visit: Payer: Self-pay

## 2021-03-26 VITALS — BP 117/76 | HR 68 | Ht 66.0 in | Wt 177.1 lb

## 2021-03-26 DIAGNOSIS — G43109 Migraine with aura, not intractable, without status migrainosus: Secondary | ICD-10-CM

## 2021-03-26 DIAGNOSIS — G454 Transient global amnesia: Secondary | ICD-10-CM | POA: Diagnosis not present

## 2021-03-26 DIAGNOSIS — R42 Dizziness and giddiness: Secondary | ICD-10-CM | POA: Diagnosis not present

## 2021-03-26 MED ORDER — SERTRALINE HCL 50 MG PO TABS: 50.0000 mg | ORAL_TABLET | Freq: Every day | ORAL | 5 refills | Status: DC

## 2021-03-26 NOTE — Patient Instructions (Signed)
Increase sertraline to 50mg  daily  MRI of brain with and without contrast Routine EEG

## 2021-03-27 NOTE — Procedures (Signed)
ELECTROENCEPHALOGRAM REPORT  Date of Study: 03/26/2021  Patient's Name: [name] MRN: [id number] Date of Birth: 17-Aug-1951  Clinical History: [name] is a 70 year old female with visual disturbance and episode of memory loss.  Medications: ZOLOFT 25 MG tablet PROVENTIL HFA;VENTOLIN HFA 108 (90 Base) MCG/ACT inhaler ZYLOPRIM 300 MG tablet TENORETIC 50-25 MG tablet PULMICORT 180 MCG/ACT inhaler ZYRTEC 10 MG tablet VITAMIN D3 25 MCG (1000 UT) tablet NEXIUM 20 MG capsule SINGULAIR 10 MG tablet MULTIVITAMIN capsule  Technical Summary: A multichannel digital EEG recording measured by the international 10-20 system with electrodes applied with paste and impedances below 5000 ohms performed in our laboratory with EKG monitoring in an awake and drowsy patient.  Photic stimulation was performed.  The digital EEG was referentially recorded, reformatted, and digitally filtered in a variety of bipolar and referential montages for optimal display.    Description: The patient is awake and drowsy during the recording.  During maximal wakefulness, there is a symmetric, medium voltage 10 Hz posterior dominant rhythm that attenuates with eye opening.  The record is symmetric.  Stagbe 2 sleep was not seen.  Photic stimulation did not elicit any abnormalities.  There were no epileptiform discharges or electrographic seizures seen.    EKG lead was unremarkable.  Impression: This awake and drowsy EEG is normal.    Clinical Correlation: A normal EEG does not exclude a clinical diagnosis of epilepsy.  If further clinical questions remain, prolonged EEG may be helpful.  Clinical correlation is advised.   Metta Clines, DO

## 2021-03-27 NOTE — Progress Notes (Signed)
Patient advised.

## 2021-04-03 ENCOUNTER — Other Ambulatory Visit: Payer: BC Managed Care – PPO

## 2021-04-07 ENCOUNTER — Other Ambulatory Visit: Payer: Self-pay

## 2021-04-07 ENCOUNTER — Ambulatory Visit
Admission: RE | Admit: 2021-04-07 | Discharge: 2021-04-07 | Disposition: A | Discharge status: Home / Self Care | Payer: BC Managed Care – PPO | Source: Ambulatory Visit | Attending: Neurology | Admitting: Neurology

## 2021-04-07 DIAGNOSIS — G454 Transient global amnesia: Secondary | ICD-10-CM | POA: Diagnosis not present

## 2021-04-07 DIAGNOSIS — Z9889 Other specified postprocedural states: Secondary | ICD-10-CM | POA: Diagnosis not present

## 2021-04-07 MED ORDER — GADOBENATE DIMEGLUMINE 529 MG/ML IV SOLN
15.0000 mL | Freq: Once | INTRAVENOUS | Status: AC | PRN
  Administered 2021-04-07: 15 mL via INTRAVENOUS

## 2021-04-10 ENCOUNTER — Other Ambulatory Visit: Payer: Self-pay

## 2021-04-10 ENCOUNTER — Ambulatory Visit: Payer: BC Managed Care – PPO | Admitting: Family

## 2021-04-10 ENCOUNTER — Encounter: Payer: Self-pay | Admitting: Family

## 2021-04-10 VITALS — BP 119/74 | HR 62 | Temp 96.4°F | Ht 66.0 in | Wt 176.2 lb

## 2021-04-10 DIAGNOSIS — F439 Reaction to severe stress, unspecified: Secondary | ICD-10-CM | POA: Diagnosis not present

## 2021-04-10 DIAGNOSIS — R519 Headache, unspecified: Secondary | ICD-10-CM

## 2021-04-10 DIAGNOSIS — R42 Dizziness and giddiness: Secondary | ICD-10-CM

## 2021-04-10 DIAGNOSIS — R350 Frequency of micturition: Secondary | ICD-10-CM

## 2021-04-10 LAB — MICROSCOPIC EXAMINATION
Bacteria, UA: NONE SEEN
Epithelial Cells (non renal): NONE SEEN /hpf (ref 0–10)
RBC, Urine: NONE SEEN /hpf (ref 0–2)
WBC, UA: NONE SEEN /hpf (ref 0–5)

## 2021-04-10 LAB — URINALYSIS, COMPLETE
Bilirubin, UA: NEGATIVE
Glucose, UA: NEGATIVE
Ketones, UA: NEGATIVE
Leukocytes,UA: NEGATIVE
Nitrite, UA: NEGATIVE
Protein,UA: NEGATIVE
RBC, UA: NEGATIVE
Specific Gravity, UA: 1.01 (ref 1.005–1.030)
Urobilinogen, Ur: 0.2 mg/dL (ref 0.2–1.0)
pH, UA: 6 (ref 5.0–7.5)

## 2021-04-10 MED ORDER — MECLIZINE HCL 50 MG PO TABS: 50.0000 mg | ORAL_TABLET | Freq: Three times a day (TID) | ORAL | 0 refills | Status: DC | PRN

## 2021-04-10 NOTE — Patient Instructions (Signed)
Dizziness Dizziness is a common problem. It is a feeling of unsteadiness or light-headedness. You may feel like you are about to faint. Dizziness can lead to injury if you stumble or fall. Anyone can become dizzy, but dizziness is more common in older adults. This condition can be caused by a number of things, including medicines, dehydration, or illness. Follow these instructions at home: Eating and drinking  Drink enough fluid to keep your urine pale yellow. This helps to keep you from becoming dehydrated. Try to drink more clear fluids, such as water. Do not drink alcohol. Limit your caffeine intake if told to do so by your health care provider. Check ingredients and nutrition facts to see if a food or beverage contains caffeine. Limit your salt (sodium) intake if told to do so by your health care provider. Check ingredients and nutrition facts to see if a food or beverage contains sodium. Activity  Avoid making quick movements. Rise slowly from chairs and steady yourself until you feel okay. In the morning, first sit up on the side of the bed. When you feel okay, stand slowly while you hold onto something until you know that your balance is good. If you need to stand in one place for a long time, move your legs often. Tighten and relax the muscles in your legs while you are standing. Do not drive or use machinery if you feel dizzy. Avoid bending down if you feel dizzy. Place items in your home so that they are easy for you to reach without leaning over. Lifestyle Do not use any products that contain nicotine or tobacco. These products include cigarettes, chewing tobacco, and vaping devices, such as e-cigarettes. If you need help quitting, ask your health care provider. Try to reduce your stress level by using methods such as yoga or meditation. Talk with your health care provider if you need help to manage your stress. General instructions Watch your dizziness for any changes. Take  over-the-counter and prescription medicines only as told by your health care provider. Talk with your health care provider if you think that your dizziness is caused by a medicine that you are taking. Tell a friend or a family member that you are feeling dizzy. If he or she notices any changes in your behavior, have this person call your health care provider. Keep all follow-up visits. This is important. Contact a health care provider if: Your dizziness does not go away or you have new symptoms. Your dizziness or light-headedness gets worse. You feel nauseous. You have reduced hearing. You have a fever. You have neck pain or a stiff neck. Your dizziness leads to an injury or a fall. Get help right away if: You vomit or have diarrhea and are unable to eat or drink anything. You have problems talking, walking, swallowing, or using your arms, hands, or legs. You feel generally weak. You have any bleeding. You are not thinking clearly or you have trouble forming sentences. It may take a friend or family member to notice this. You have chest pain, abdominal pain, shortness of breath, or sweating. Your vision changes or you develop a severe headache. These symptoms may represent a serious problem that is an emergency. Do not wait to see if the symptoms will go away. Get medical help right away. Call your local emergency services (911 in the [place] Do not drive yourself to the hospital. Summary Dizziness is a feeling of unsteadiness or light-headedness. This condition can be caused by a number of   things, including medicines, dehydration, or illness. Anyone can become dizzy, but dizziness is more common in older adults. Drink enough fluid to keep your urine pale yellow. Do not drink alcohol. Avoid making quick movements if you feel dizzy. Monitor your dizziness for any changes. This information is not intended to replace advice given to you by your health care provider. Make sure you discuss any  questions you have with your health care provider. Document Revised: 08/07/2020 Document Reviewed: 08/07/2020 Elsevier Patient Education  2022 Elsevier Inc.  

## 2021-04-10 NOTE — Progress Notes (Signed)
Subjective:    Patient ID: [name], female    DOB: April 29, 1951, 70 y.o.   MRN: [id number]  Chief Complaint  Patient presents with   Migraine    Having occular migraines went to neurologist. Had MRI [place]. Hard spot on arm. Patient is under stress     Dizziness   Pt presents to the office today with complaints of dizziness. She is followed by Neurologists. She saw him on 03/26/21 and was thought to be having ocular migraine. She had a MRI and EEG.   She reports 6/8 she had floaters, a headache, and dizziness. She has reports the floaters improved, however her dizziness continues. She reports this dizziness is every day and can last hours. She has not taken any medications for this.   Her MRI showed, No acute intracranial abnormality and MRI appearance of the brain remains normal for age."  Her EEG was normal.   Her Zoloft was increased to 50 mg from 25 mg. She is in the process of retiring and states she has had a great deal of stress with this.  Dizziness This is a new problem. The current episode started more than 1 month ago. The problem occurs intermittently. The problem has been waxing and waning. Associated symptoms include headaches, nausea and a visual change. Pertinent negatives include no congestion, coughing, fatigue, fever, joint swelling, numbness or sore throat. The symptoms are aggravated by stress. She has tried nothing for the symptoms. The treatment provided no relief.  Anxiety Presents for follow-up visit. Symptoms include decreased concentration, dizziness, excessive worry, irritability, nausea and restlessness. Symptoms occur most days. The severity of symptoms is moderate.    Urinary Frequency  This is a new problem. The current episode started 1 to 4 weeks ago. The problem has been waxing and waning. Associated symptoms include frequency and nausea. She has tried increased fluids for the symptoms. The treatment provided mild relief.     Review of  Systems  Constitutional:  Positive for irritability. Negative for fatigue and fever.  HENT:  Negative for congestion and sore throat.   Respiratory:  Negative for cough.   Gastrointestinal:  Positive for nausea.  Genitourinary:  Positive for frequency.  Musculoskeletal:  Negative for joint swelling.  Neurological:  Positive for dizziness and headaches. Negative for numbness.  Psychiatric/Behavioral:  Positive for decreased concentration.   All other systems reviewed and are negative.     Objective:   Physical Exam Vitals reviewed.  Constitutional:      General: She is not in acute distress.    Appearance: She is well-developed.  HENT:     Head: Normocephalic and atraumatic.     Right Ear: Tympanic membrane normal.     Left Ear: Tympanic membrane normal.  Eyes:     Pupils: Pupils are equal, round, and reactive to light.  Neck:     Thyroid: No thyromegaly.  Cardiovascular:     Rate and Rhythm: Normal rate and regular rhythm.     Heart sounds: Normal heart sounds. No murmur heard. Pulmonary:     Effort: Pulmonary effort is normal. No respiratory distress.     Breath sounds: Normal breath sounds. No wheezing.  Abdominal:     General: Bowel sounds are normal. There is no distension.     Palpations: Abdomen is soft.     Tenderness: There is no abdominal tenderness.  Musculoskeletal:        General: No tenderness. Normal range of motion.     Cervical  back: Normal range of motion and neck supple.  Skin:    General: Skin is warm and dry.  Neurological:     Mental Status: She is alert and oriented to person, place, and time.     Cranial Nerves: No cranial nerve deficit.     Deep Tendon Reflexes: Reflexes are normal and symmetric.  Psychiatric:        Behavior: Behavior normal.        Thought Content: Thought content normal.        Judgment: Judgment normal.     BP 119/74   Pulse 62   Temp (!) 96.4 F (35.8 C) (Temporal)   Ht _0  (1.676 m)   Wt 176 lb 3.2 oz (79.9 kg)    BMI 28.44 kg/m       Assessment & Plan:  RANDELL DETTER comes in today with chief complaint of Migraine (Having occular migraines went to neurologist. Had MRI [place]. Hard spot on arm. Patient is under stress  ) and Dizziness   Diagnosis and orders addressed:  1. Dizziness Start Antivert as needed Fall precautions discussed  Keep neurologists follow up May need referral to ENT - meclizine (ANTIVERT) 50 MG tablet; Take 1 tablet (50 mg total) by mouth 3 (three) times daily as needed.  Dispense: 30 tablet; Refill: 0 - Anemia Profile B - CMP14+EGFR - TSH  2. Acute nonintractable headache, unspecified headache type Keep Neurologists  - CMP14+EGFR  3. Stress Will continue Zoloft, discuss increase to 100 mg if needed. - CMP14+EGFR  4. Urinary frequency - Urinalysis, Complete   Labs pending Health Maintenance reviewed Diet and exercise encouraged  Follow up plan: Keep follow up with Neurologists and PCP  Evelina Dun, FNP

## 2021-04-10 NOTE — Progress Notes (Signed)
Lmovm for pt to call the office back.

## 2021-04-11 ENCOUNTER — Other Ambulatory Visit: Payer: Self-pay | Admitting: Family

## 2021-04-11 DIAGNOSIS — M9902 Segmental and somatic dysfunction of thoracic region: Secondary | ICD-10-CM | POA: Diagnosis not present

## 2021-04-11 DIAGNOSIS — M9901 Segmental and somatic dysfunction of cervical region: Secondary | ICD-10-CM | POA: Diagnosis not present

## 2021-04-11 DIAGNOSIS — M9903 Segmental and somatic dysfunction of lumbar region: Secondary | ICD-10-CM | POA: Diagnosis not present

## 2021-04-11 DIAGNOSIS — M62830 Muscle spasm of back: Secondary | ICD-10-CM | POA: Diagnosis not present

## 2021-04-11 MED ORDER — ATENOLOL 50 MG PO TABS: 50.0000 mg | ORAL_TABLET | Freq: Every day | ORAL | 1 refills | Status: DC

## 2021-04-11 MED ORDER — POTASSIUM CHLORIDE CRYS ER 20 MEQ PO TBCR: 40.0000 meq | EXTENDED_RELEASE_TABLET | Freq: Two times a day (BID) | ORAL | 0 refills | Status: DC

## 2021-04-23 ENCOUNTER — Other Ambulatory Visit: Payer: Self-pay

## 2021-04-23 ENCOUNTER — Encounter: Payer: Self-pay | Admitting: Family

## 2021-04-23 ENCOUNTER — Ambulatory Visit: Payer: BC Managed Care – PPO | Admitting: Family

## 2021-04-23 VITALS — BP 132/86 | HR 67 | Wt 179.0 lb

## 2021-04-23 DIAGNOSIS — I10 Essential (primary) hypertension: Secondary | ICD-10-CM | POA: Diagnosis not present

## 2021-04-23 DIAGNOSIS — E876 Hypokalemia: Secondary | ICD-10-CM

## 2021-04-23 DIAGNOSIS — R42 Dizziness and giddiness: Secondary | ICD-10-CM

## 2021-04-23 NOTE — Patient Instructions (Signed)
Hypokalemia Hypokalemia means that the amount of potassium in the blood is lower than normal. Potassium is a chemical (electrolyte) that helps regulate the amount of fluid in the body. It also stimulates muscle tightening (contraction) and helps nerves work properly. Normally, most of the body's potassium is inside cells, and only a very small amount is in the blood. Because the amount in the blood is so small, minorchanges to potassium levels in the blood can be life-threatening. What are the causes? This condition may be caused by: Antibiotic medicine. Diarrhea or vomiting. Taking too much of a medicine that helps you have a bowel movement (laxative) can cause diarrhea and lead to hypokalemia. Chronic kidney disease (CKD). Medicines that help the body get rid of excess fluid (diuretics). Eating disorders, such as bulimia. Low magnesium levels in the body. Sweating a lot. What are the signs or symptoms? Symptoms of this condition include: Weakness. Constipation. Fatigue. Muscle cramps. Mental confusion. Skipped heartbeats or irregular heartbeat (palpitations). Tingling or numbness. How is this diagnosed? This condition is diagnosed with a blood test. How is this treated? This condition may be treated by: Taking potassium supplements by mouth. Adjusting the medicines that you take. Eating more foods that contain a lot of potassium. If your potassium level is very low, you may need to get potassium through anIV and be monitored in the hospital. Follow these instructions at home:  Take over-the-counter and prescription medicines only as told by your health care provider. This includes vitamins and supplements. Eat a healthy diet. A healthy diet includes fresh fruits and vegetables, whole grains, healthy fats, and lean proteins. If instructed, eat more foods that contain a lot of potassium. This includes: Nuts, such as peanuts and pistachios. Seeds, such as sunflower seeds and pumpkin  seeds. Peas, lentils, and lima beans. Whole grain and bran cereals and breads. Fresh fruits and vegetables, such as apricots, avocado, bananas, cantaloupe, kiwi, oranges, tomatoes, asparagus, and potatoes. Orange juice. Tomato juice. Red meats. Yogurt. Keep all follow-up visits as told by your health care provider. This is important. Contact a health care provider if you: Have weakness that gets worse. Feel your heart pounding or racing. Vomit. Have diarrhea. Have diabetes (diabetes mellitus) and you have trouble keeping your blood sugar (glucose) in your target range. Get help right away if you: Have chest pain. Have shortness of breath. Have vomiting or diarrhea that lasts for more than 2 days. Faint. Summary Hypokalemia means that the amount of potassium in the blood is lower than normal. This condition is diagnosed with a blood test. Hypokalemia may be treated by taking potassium supplements, adjusting the medicines that you take, or eating more foods that are high in potassium. If your potassium level is very low, you may need to get potassium through an IV and be monitored in the hospital. This information is not intended to replace advice given to you by your health care provider. Make sure you discuss any questions you have with your healthcare provider. Document Revised: 04/15/2018 Document Reviewed: 04/15/2018 Elsevier Patient Education  [employer].

## 2021-04-23 NOTE — Progress Notes (Signed)
   Subjective:    Patient ID: [name], female    DOB: 02/25/1951, 70 y.o.   MRN: [id number]  Chief Complaint  Patient presents with   Medical Management of Chronic Issues    B/P recheck   Pt presents to the office today to recheck BP and potassium. She was seen on 04/10/21 and found to have a K+ of 2.8. We stopped her chlorthalidone and started on potassium chloride 40 mcg BID for 10 days.   She reports her dizziness has improved since stopping her fluid pill  Hypertension The current episode started more than 1 year ago. The problem has been waxing and waning since onset. The problem is controlled. Pertinent negatives include no malaise/fatigue, peripheral edema or shortness of breath. Risk factors for coronary artery disease include obesity. Past treatments include beta blockers.     Review of Systems  Constitutional:  Negative for malaise/fatigue.  Respiratory:  Negative for shortness of breath.   All other systems reviewed and are negative.     Objective:   Physical Exam Vitals reviewed.  Constitutional:      General: She is not in acute distress.    Appearance: She is well-developed.  HENT:     Head: Normocephalic and atraumatic.  Eyes:     Pupils: Pupils are equal, round, and reactive to light.  Neck:     Thyroid: No thyromegaly.  Cardiovascular:     Rate and Rhythm: Normal rate and regular rhythm.     Heart sounds: Normal heart sounds. No murmur heard. Pulmonary:     Effort: Pulmonary effort is normal. No respiratory distress.     Breath sounds: Normal breath sounds. No wheezing.  Abdominal:     General: Bowel sounds are normal. There is no distension.     Palpations: Abdomen is soft.     Tenderness: There is no abdominal tenderness.  Musculoskeletal:        General: No tenderness. Normal range of motion.     Cervical back: Normal range of motion and neck supple.  Skin:    General: Skin is warm and dry.  Neurological:     Mental Status: She is alert and  oriented to person, place, and time.     Cranial Nerves: No cranial nerve deficit.     Deep Tendon Reflexes: Reflexes are normal and symmetric.  Psychiatric:        Behavior: Behavior normal.        Thought Content: Thought content normal.        Judgment: Judgment normal.     BP 132/86 Comment: patient reports at home  Pulse 67   Wt 179 lb (81.2 kg)   SpO2 100%   BMI 28.89 kg/m       Assessment & Plan:  [name] comes in today with chief complaint of Medical Management of Chronic Issues (B/P recheck)   Diagnosis and orders addressed:  1. Hypokalemia Will recheck today Discussed high potassium diet- Handout given  2. Essential hypertension Slightly elevated today, but states it has been normal at home   3. Dizziness Resolved since stopping chlorthalidone      Evelina Dun, FNP

## 2021-04-24 LAB — BMP8+EGFR
BUN/Creatinine Ratio: 16 (ref 12–28)
BUN: 16 mg/dL (ref 8–27)
CO2: 17 mmol/L — ABNORMAL LOW (ref 20–29)
Calcium: 9.7 mg/dL (ref 8.7–10.3)
Chloride: 104 mmol/L (ref 96–106)
Creatinine, Ser: 0.98 mg/dL (ref 0.57–1.00)
Glucose: 93 mg/dL (ref 65–99)
Potassium: 4.7 mmol/L (ref 3.5–5.2)
Sodium: 143 mmol/L (ref 134–144)
eGFR: 62 mL/min/{1.73_m2} (ref >59)

## 2021-05-01 LAB — ANEMIA PROFILE B
Basophils Absolute: 0.1 10*3/uL (ref 0.0–0.2)
Basos: 1 %
EOS (ABSOLUTE): 0.2 10*3/uL (ref 0.0–0.4)
Eos: 2 %
Ferritin: 21 ng/mL (ref 15–150)
Folate: 17.4 ng/mL (ref >3.0)
Hematocrit: 39.9 % (ref 34.0–46.6)
Hemoglobin: 13.8 g/dL (ref 11.1–15.9)
Immature Grans (Abs): 0 10*3/uL (ref 0.0–0.1)
Immature Granulocytes: 0 %
Iron Saturation: 20 % (ref 15–55)
Iron: 64 ug/dL (ref 27–139)
Lymphocytes Absolute: 2.5 10*3/uL (ref 0.7–3.1)
Lymphs: 30 %
MCH: 28.8 pg (ref 26.6–33.0)
MCHC: 34.6 g/dL (ref 31.5–35.7)
MCV: 83 fL (ref 79–97)
Monocytes Absolute: 0.5 10*3/uL (ref 0.1–0.9)
Monocytes: 6 %
Neutrophils Absolute: 5.1 10*3/uL (ref 1.4–7.0)
Neutrophils: 61 %
Platelets: 202 10*3/uL (ref 150–450)
RBC: 4.79 x10E6/uL (ref 3.77–5.28)
RDW: 13.6 % (ref 11.7–15.4)
Retic Ct Pct: 1.1 % (ref 0.6–2.6)
Total Iron Binding Capacity: 323 ug/dL (ref 250–450)
UIBC: 259 ug/dL (ref 118–369)
Vitamin B-12: 401 pg/mL (ref 232–1245)
WBC: 8.4 10*3/uL (ref 3.4–10.8)

## 2021-05-01 LAB — CMP14+EGFR
ALT: 19 IU/L (ref 0–32)
AST: 20 IU/L (ref 0–40)
Albumin/Globulin Ratio: 2.6 — ABNORMAL HIGH (ref 1.2–2.2)
Albumin: 4.9 g/dL — ABNORMAL HIGH (ref 3.8–4.8)
BUN/Creatinine Ratio: 20 (ref 12–28)
BUN: 18 mg/dL (ref 8–27)
Bilirubin Total: 0.5 mg/dL (ref 0.0–1.2)
CO2: 24 mmol/L (ref 20–29)
Calcium: 9.4 mg/dL (ref 8.7–10.3)
Chloride: 95 mmol/L — ABNORMAL LOW (ref 96–106)
Creatinine, Ser: 0.9 mg/dL (ref 0.57–1.00)
Globulin, Total: 1.9 g/dL (ref 1.5–4.5)
Glucose: 129 mg/dL — ABNORMAL HIGH (ref 65–99)
Potassium: 2.8 mmol/L — ABNORMAL LOW (ref 3.5–5.2)
Sodium: 141 mmol/L (ref 134–144)
Total Protein: 6.8 g/dL (ref 6.0–8.5)
eGFR: 69 mL/min/{1.73_m2} (ref >59)

## 2021-05-01 LAB — TSH: TSH: 2.65 u[IU]/mL (ref 0.450–4.500)

## 2021-05-17 ENCOUNTER — Other Ambulatory Visit: Payer: Self-pay

## 2021-05-17 MED ORDER — ATENOLOL 50 MG PO TABS: 50.0000 mg | ORAL_TABLET | Freq: Every day | ORAL | 1 refills | Status: DC

## 2021-05-22 ENCOUNTER — Other Ambulatory Visit: Payer: Self-pay

## 2021-05-22 MED ORDER — ATENOLOL 50 MG PO TABS: 50.0000 mg | ORAL_TABLET | Freq: Every day | ORAL | 1 refills | Status: DC

## 2021-05-24 DIAGNOSIS — E782 Mixed hyperlipidemia: Secondary | ICD-10-CM | POA: Diagnosis not present

## 2021-05-25 LAB — LIPID PANEL
Chol/HDL Ratio: 5.1 ratio — ABNORMAL HIGH (ref 0.0–4.4)
Cholesterol, Total: 253 mg/dL — ABNORMAL HIGH (ref 100–199)
HDL: 50 mg/dL (ref >39)
LDL Chol Calc (NIH): 165 mg/dL — ABNORMAL HIGH (ref 0–99)
Triglycerides: 207 mg/dL — ABNORMAL HIGH (ref 0–149)
VLDL Cholesterol Cal: 38 mg/dL (ref 5–40)

## 2021-05-28 DIAGNOSIS — Z85828 Personal history of other malignant neoplasm of skin: Secondary | ICD-10-CM | POA: Diagnosis not present

## 2021-05-28 DIAGNOSIS — M62830 Muscle spasm of back: Secondary | ICD-10-CM | POA: Diagnosis not present

## 2021-05-28 DIAGNOSIS — M9901 Segmental and somatic dysfunction of cervical region: Secondary | ICD-10-CM | POA: Diagnosis not present

## 2021-05-28 DIAGNOSIS — D485 Neoplasm of uncertain behavior of skin: Secondary | ICD-10-CM | POA: Diagnosis not present

## 2021-05-28 DIAGNOSIS — M9903 Segmental and somatic dysfunction of lumbar region: Secondary | ICD-10-CM | POA: Diagnosis not present

## 2021-05-28 DIAGNOSIS — D225 Melanocytic nevi of trunk: Secondary | ICD-10-CM | POA: Diagnosis not present

## 2021-05-28 DIAGNOSIS — M9902 Segmental and somatic dysfunction of thoracic region: Secondary | ICD-10-CM | POA: Diagnosis not present

## 2021-05-29 ENCOUNTER — Ambulatory Visit (INDEPENDENT_AMBULATORY_CARE_PROVIDER_SITE_OTHER): Payer: Medicare Other | Admitting: Internal Medicine

## 2021-05-29 ENCOUNTER — Other Ambulatory Visit: Payer: Self-pay

## 2021-05-29 ENCOUNTER — Encounter: Payer: Self-pay | Admitting: Internal Medicine

## 2021-05-29 VITALS — BP 142/83 | HR 60 | Ht 66.0 in | Wt 179.8 lb

## 2021-05-29 DIAGNOSIS — E782 Mixed hyperlipidemia: Secondary | ICD-10-CM

## 2021-05-29 DIAGNOSIS — T466X5D Adverse effect of antihyperlipidemic and antiarteriosclerotic drugs, subsequent encounter: Secondary | ICD-10-CM | POA: Diagnosis not present

## 2021-05-29 DIAGNOSIS — M791 Myalgia, unspecified site: Secondary | ICD-10-CM

## 2021-05-29 DIAGNOSIS — I10 Essential (primary) hypertension: Secondary | ICD-10-CM | POA: Diagnosis not present

## 2021-05-29 DIAGNOSIS — Z8249 Family history of ischemic heart disease and other diseases of the circulatory system: Secondary | ICD-10-CM

## 2021-05-29 NOTE — Patient Instructions (Signed)
Medication Instructions:  Your physician recommends that you continue on your current medications as directed. Please refer to the Current Medication list given to you today.  *If you need a refill on your cardiac medications before your next appointment, please call your pharmacy*   Lab Work: FASTING lab work in 6 months to check cholesterol  -- complete about 1 week before your next visit with Dr. Debara Pickett   If you have labs (blood work) drawn today and your tests are completely normal, you will receive your results only by: [place] (if you have MyChart) OR A paper copy in the mail If you have any lab test that is abnormal or we need to change your treatment, we will call you to review the results.   Testing/Procedures: NONE   Follow-Up: At [facility], you and your health needs are our priority.  As part of our continuing mission to provide you with exceptional heart care, we have created designated Provider Care Teams.  These Care Teams include your primary Cardiologist (physician) and Advanced Practice Providers (APPs -  Physician Assistants and Nurse Practitioners) who all work together to provide you with the care you need, when you need it.  We recommend signing up for the patient portal called "MyChart".  Sign up information is provided on this After Visit Summary.  MyChart is used to connect with patients for Virtual Visits (Telemedicine).  Patients are able to view lab/test results, encounter notes, upcoming appointments, etc.  Non-urgent messages can be sent to your provider as well.   To learn more about what you can do with MyChart, go to [email or web address].    Your next appointment:   6 month(s)  The format for your next appointment:   In Person  Provider:   You may see Pixie Casino, MD or one of the following Advanced Practice Providers on your designated Care Team:   Almyra Deforest, PA-C Fabian Sharp, PA-C or  Roby Lofts, [place]   Other  Instructions

## 2021-05-29 NOTE — Progress Notes (Signed)
OFFICE NOTE  Chief Complaint:  Routine follow-up  Primary Care Physician: Tanya Norlander, DO  HPI:  [name] is a 70 y.o. female Who I previously seen in 2011 for palpitations and dyslipidemia. She is a former patient of Dr. Janene Simmons. Her mother [name] was a patient of mine who died recently. I have not seen her since 2011 although she had an appointment scheduled in 2015 which was canceled. Recently she's noted that she's had some elevated blood pressures. She was preparing for a beach trip and had some chest discomfort. This lasted for 2 or 3 hours and was described as a vertical line down the center of her chest. It was more of a dull pressure and resolved spontaneously. She's not had any exertional symptoms however is limited by a pinched nerve in her low back. She did have a cane for which she used to walk today. She does not exercise regularly. She also mentioned an episode where she had about 8 hours of difficulty recalling names and some memory loss after a traumatic event shortly after her mother died. She had an extensive workup for this including an MRI of the brain and ultimately no clear cause was found. She reports very good control of her her palpitations.  11/21/2017  [name] returns today for follow-up.  Overall she is doing well.  She denies any chest pain or worsening shortness of breath.  Previously I had added chlorthalidone to her atenolol.  She is doing well with that and reports blood pressures in the [id number] and [id number] systolic at home.  Recently she had developed a cyst on her left middle finger.  She had surgery for that this week.  She has reported some fatigue and wonders whether or not that may be related to a B12 deficiency.  There are apparently some reports that B12 deficiency can lead to cysts such as when she had on her hand.  Additionally, we discussed her cholesterol today.  In the past she has been intolerant to rosuvastatin and Vytorin.  She  was placed on pravastatin by me for elevated LDL cholesterol close to 190.  She had a marked reduction in total cholesterol on it however may have had side effects.  She discontinued it over a year ago and is not restarted.  She has no known atherosclerotic cardiovascular disease.  In 2016 she had carotid Dopplers which were negative in the setting of a TIA.  The etiology of the TIA is not clear.  03/16/2018  [name] returns today for follow-up.  She underwent coronary artery calcium scoring which was reported as 0, however she was found to have a small subcentimeter nodule which will need follow-up with a repeat CT scan in a year.  Overall she is doing well.  She was confused and got her lipid profile a month earlier than her appointment.  At lipid profile showed total cholesterol 198, triglycerides 178, HDL 48 and LDL 114.  She therefore has had improvement on pravastatin.  We discussed whether or not she actually needs to be on statin therapy, interestingly since she has a 0 calcium score.  Her ten-year risk is actually quite low however one considers her 30-year risk which is reasonable given her age in the mid 36s, would argue for statin therapy provided her diet is optimized.  She seems to be tolerating pravastatin without side effects.  05/12/2019  [name] is seen today in routine follow-up.  Overall she seems  to be doing well.  She has been taking pravastatin without any side effects.  She is noted a marked improvement in her lipid profile.  Total cholesterol is now 204, triglycerides 142, HDL 48 and LDL 128.  We had suggested a goal LDL less than 100.  Although her LDL is a little higher, this may represent recent dietary changes.  Her calcium score was 0 suggesting low risk of cardiovascular events.  Blood pressure is well controlled today.  05/11/2020  [name] is seen today in follow-up.  Overall she seems to be doing well.  Most recently she had a lipid profile showing total cholesterol 187,  triglycerides 117, HDL 46 and LDL 116.  She has been intolerant in the past to Crestor and Vytorin both causing myalgias.  Currently she is on pravastatin 40 mg.  EKG shows normal sinus rhythm.  She denies chest pain or shortness of breath.  As mentioned above she had a low calcium score of 0.  I would recommend a target LDL less than 100.  12/12/2020  [name] is seen today in follow-up.  Overall she seems to be doing well.  Blood pressure was well controlled.  EKG shows a sinus bradycardia.  She had labs in July of last year showing total cholesterol 183, HDL 46, LDL 116 and triglycerides 117, slightly higher than ideal.  Her cholesterol was then repeated just last week showing total cholesterol 238, triglycerides 201, HDL 44 and LDL 157.  She is now not currently on statin therapy due to side effects.  She could also not tolerate ezetimibe.  05/29/2021  [name] returns today for follow-up.  She is without any new complaints.  Unfortunately she has been intolerant to statins.  Her cholesterol is a little bit higher now with total 253, HDL 50 LDL 165 and triglycerides 207.  She had no coronary calcium in 2019.  She wants to continue to work on diet and activities and exercise since she is recently retired.  We talked about possible over-the-counter options including red yeast rice although cautioned her it may cause side effects as previous.  We also had previously tried ezetimibe but she did not tolerate that.  EKG shows normal sinus rhythm today.  She had recently had some issues with dizziness and was found to be hypokalemic.  Her chlorthalidone was discontinued.  She is remains on atenolol.  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%, no wall motion abnormalities, no inducible ischemia, low risk    OVARIAN CYST REMOVAL  1987   TONSILECTOMY/ADENOIDECTOMY WITH MYRINGOTOMY  1958   TONSILLECTOMY     TRANSTHORACIC ECHOCARDIOGRAM  02/2007   RV mildly dlated; LV normal in size; mild MR with thickened MV leaflets; mild TR; mild pulm valve regurg   VAGINAL DELIVERY     x3    FAMHx:  Family History  Problem Relation Age of Onset   Heart disease Mother    Kidney disease Mother  Breast cancer Mother    Coronary artery disease Mother        CABG at age 82   Dementia Father    Stroke Father    Stroke Maternal Grandmother    Cancer Maternal Grandfather    Cancer Paternal Grandmother    Kidney disease Paternal Grandfather     SOCHx:   reports that she has never smoked. She has never used smokeless tobacco. She reports that she does not drink alcohol and does not use drugs.  ALLERGIES:  Allergies  Allergen Reactions   Bee Venom Swelling   Crestor [Rosuvastatin] Other (See Comments)    Myalgias   Erythromycin Itching, Rash and Other (See Comments)   Latex Itching and Rash    rash   Rosuvastatin Calcium Other (See Comments)    Myalgias   Pravastatin Other (See Comments)    Pain in joints.   Zetia [Ezetimibe]    Aleve [Naproxen Sodium]     Gastritis   Tetracyclines & Related Other (See Comments)    unknown   Vytorin [Ezetimibe-Simvastatin] Other (See Comments)    unknown    ROS: Pertinent items noted in HPI and remainder of comprehensive ROS otherwise negative.  HOME MEDS: Current Outpatient Medications on File Prior to Visit  Medication Sig Dispense Refill   albuterol (PROVENTIL HFA;VENTOLIN HFA) 108 (90 Base) MCG/ACT inhaler Inhale 2 puffs into the lungs every 4 (four) hours as needed for wheezing or shortness of breath. 1 Inhaler 0   allopurinol (ZYLOPRIM) 300 MG  tablet Take 1 tablet (300 mg total) by mouth every evening. 90 tablet 3   Ascorbic Acid (VITAMIN C PO) Take by mouth.     atenolol (TENORMIN) 50 MG tablet Take 1 tablet (50 mg total) by mouth daily. 90 tablet 1   BIOTIN PO Take by mouth.     budesonide (PULMICORT) 180 MCG/ACT inhaler Inhale 2 puffs into the lungs 2 (two) times daily.     cetirizine (ZYRTEC) 10 MG tablet Take 1 tablet by mouth daily.     cholecalciferol (VITAMIN D3) 25 MCG (1000 UT) tablet Take 1,000 Units by mouth daily.     esomeprazole (NEXIUM) 20 MG capsule Take 20 mg by mouth daily at 12 noon.     glucose blood (CONTOUR NEXT TEST) test strip Test BS daily and as needed Dx R73.09 100 each 3   Lancets 30G MISC Test BS daily and as needed Dx R73.09 100 each 3   MAGNESIUM PO Take by mouth.     meclizine (ANTIVERT) 50 MG tablet Take 1 tablet (50 mg total) by mouth 3 (three) times daily as needed. 30 tablet 0   montelukast (SINGULAIR) 10 MG tablet TAKE 1 TABLET(10 MG) BY MOUTH EVERY MORNING 90 tablet 3   Multiple Vitamin (MULTIVITAMIN) capsule Take 1 capsule by mouth daily.      sertraline (ZOLOFT) 50 MG tablet Take 1 tablet (50 mg total) by mouth daily. 30 tablet 5   potassium chloride SA (KLOR-CON) 20 MEQ tablet Take 2 tablets (40 mEq total) by mouth 2 (two) times daily for 10 days. 40 tablet 0   No current facility-administered medications on file prior to visit.    LABS/IMAGING: No results found for this or any previous visit (from the past 48 hour(s)). No results found.  WEIGHTS: Wt Readings from Last 3 Encounters:  05/29/21 179 lb 12.8 oz (81.6 kg)  04/23/21 179 lb (81.2 kg)  04/10/21 176 lb 3.2 oz (79.9 kg)    VITALS: BP Marland Kitchen)  142/83   Pulse 60   Ht '5\' 6"'$  (1.676 m)   Wt 179 lb 12.8 oz (81.6 kg)   SpO2 98%   BMI 29.02 kg/m   EXAM: General appearance: alert and no distress Neck: no carotid bruit, no JVD and thyroid not enlarged, symmetric, no tenderness/mass/nodules Lungs: clear to auscultation  bilaterally Heart: regular rate and rhythm Abdomen: soft, non-tender; bowel sounds normal; no masses,  no organomegaly Extremities: extremities normal, atraumatic, no cyanosis or edema Pulses: 2+ and symmetric Skin: Skin color, texture, turgor normal. No rashes or lesions Neurologic: Grossly normal Psych: Pleasant  EKG: Sinus rhythm at 64 -personally reviewed  ASSESSMENT: Dyslipidemia, statin and ezetimibe intolerant - myalgias Hypertension - uncontrolled Palpitations Family history of cardiovascular disease 0 coronary calcium score (11/2017) Aortic atherosclersosis  PLAN: 1.   Mrs. Simmons seems to be doing well although her cholesterol is higher.  She is going to work on diet and exercise more now that she is retired.  Plan to repeat lipid in 6 months.  She could try over-the-counter red yeast rice.  Ultimately if her cholesterol continues to go higher, we may need to consider some other options for her.  I also advised her to monitor blood pressure as she may need additional agents since she is not on chlorthalidone.  Follow-up with me in 6 months or sooner as necessary.  Pixie Casino, MD, [facility], [place] Director of the [facility] Lipid Disorders &  Cardiovascular Risk Reduction [facility] Diplomate of the American Board of Clinical Lipidology Attending Cardiologist  Direct Dial: [phone]  Fax: [phone]  Website:  [email or web address] 05/29/2021, 1:56 PM

## 2021-05-31 DIAGNOSIS — M9902 Segmental and somatic dysfunction of thoracic region: Secondary | ICD-10-CM | POA: Diagnosis not present

## 2021-05-31 DIAGNOSIS — M9901 Segmental and somatic dysfunction of cervical region: Secondary | ICD-10-CM | POA: Diagnosis not present

## 2021-05-31 DIAGNOSIS — M62830 Muscle spasm of back: Secondary | ICD-10-CM | POA: Diagnosis not present

## 2021-05-31 DIAGNOSIS — M9903 Segmental and somatic dysfunction of lumbar region: Secondary | ICD-10-CM | POA: Diagnosis not present

## 2021-06-04 DIAGNOSIS — Z1231 Encounter for screening mammogram for malignant neoplasm of breast: Secondary | ICD-10-CM | POA: Diagnosis not present

## 2021-06-05 DIAGNOSIS — M62830 Muscle spasm of back: Secondary | ICD-10-CM | POA: Diagnosis not present

## 2021-06-05 DIAGNOSIS — M9903 Segmental and somatic dysfunction of lumbar region: Secondary | ICD-10-CM | POA: Diagnosis not present

## 2021-06-05 DIAGNOSIS — M9902 Segmental and somatic dysfunction of thoracic region: Secondary | ICD-10-CM | POA: Diagnosis not present

## 2021-06-05 DIAGNOSIS — M9901 Segmental and somatic dysfunction of cervical region: Secondary | ICD-10-CM | POA: Diagnosis not present

## 2021-06-06 DIAGNOSIS — M62830 Muscle spasm of back: Secondary | ICD-10-CM | POA: Diagnosis not present

## 2021-06-06 DIAGNOSIS — M9903 Segmental and somatic dysfunction of lumbar region: Secondary | ICD-10-CM | POA: Diagnosis not present

## 2021-06-06 DIAGNOSIS — M9901 Segmental and somatic dysfunction of cervical region: Secondary | ICD-10-CM | POA: Diagnosis not present

## 2021-06-06 DIAGNOSIS — M9902 Segmental and somatic dysfunction of thoracic region: Secondary | ICD-10-CM | POA: Diagnosis not present

## 2021-06-07 ENCOUNTER — Other Ambulatory Visit: Payer: Self-pay | Admitting: *Deleted

## 2021-06-07 MED ORDER — MONTELUKAST SODIUM 10 MG PO TABS: ORAL_TABLET | ORAL | 1 refills | Status: DC

## 2021-06-08 ENCOUNTER — Other Ambulatory Visit: Payer: Self-pay

## 2021-06-08 MED ORDER — ALLOPURINOL 300 MG PO TABS: 300.0000 mg | ORAL_TABLET | Freq: Every evening | ORAL | 1 refills | Status: DC

## 2021-06-11 MED ORDER — ALLOPURINOL 300 MG PO TABS: 300.0000 mg | ORAL_TABLET | Freq: Every evening | ORAL | 3 refills | Status: DC

## 2021-06-14 DIAGNOSIS — D485 Neoplasm of uncertain behavior of skin: Secondary | ICD-10-CM | POA: Diagnosis not present

## 2021-06-14 DIAGNOSIS — L988 Other specified disorders of the skin and subcutaneous tissue: Secondary | ICD-10-CM | POA: Diagnosis not present

## 2021-06-21 ENCOUNTER — Other Ambulatory Visit: Payer: Self-pay | Admitting: Family Medicine

## 2021-06-21 ENCOUNTER — Other Ambulatory Visit: Payer: Self-pay | Admitting: Internal Medicine

## 2021-06-21 IMAGING — CT CT MAXILLOFACIAL W/O CM
3 of 4 series · 15 of 47 positions shown, 18 images · non-contrast
Comparison: Brain MRI 12/06/2014.  Head CT 12/06/2014.

CLINICAL DATA: Facial trauma. Additional provided: Patient reports
walking into a wall today, causing her to fall backwards. Right
periorbital ecchymosis.

EXAM:
CT HEAD WITHOUT CONTRAST
CT MAXILLOFACIAL WITHOUT CONTRAST
TECHNIQUE: Multidetector CT imaging of the head and maxillofacial structures
were performed using the standard protocol without intravenous
contrast. Multiplanar CT image reconstructions of the maxillofacial
structures were also generated.

[Series 7: max soft · axial · 0.33mm/px · z∈[-202,-62]mm · 9 of 82 slices shown, 12 images]
[im 6/82  brain]
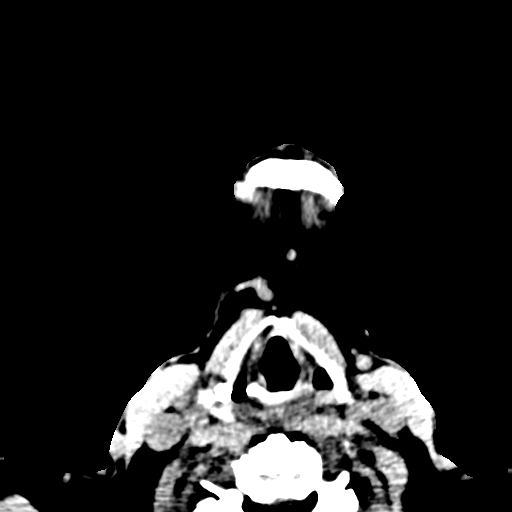
[im 6/82  bone]
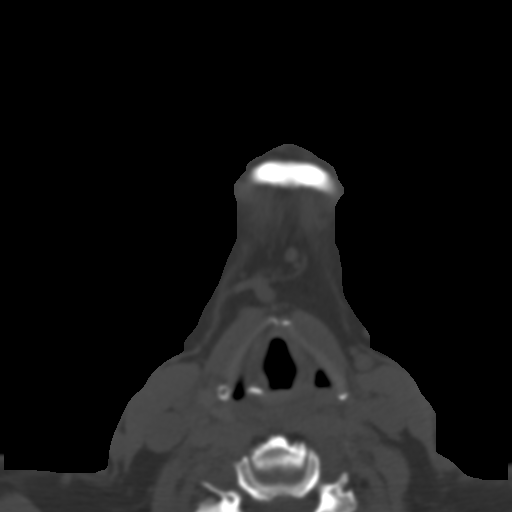
[im 14/82  bone]
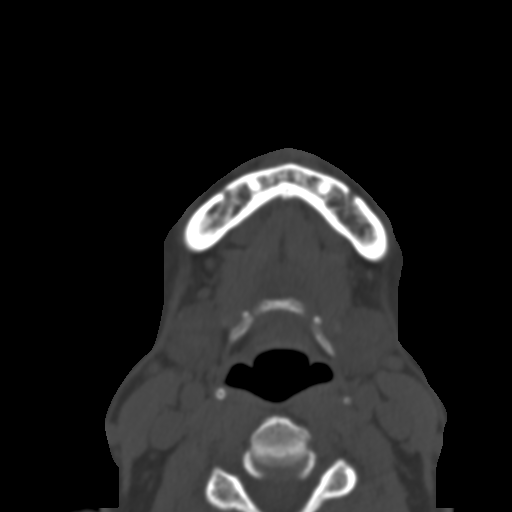
[im 23/82  bone]
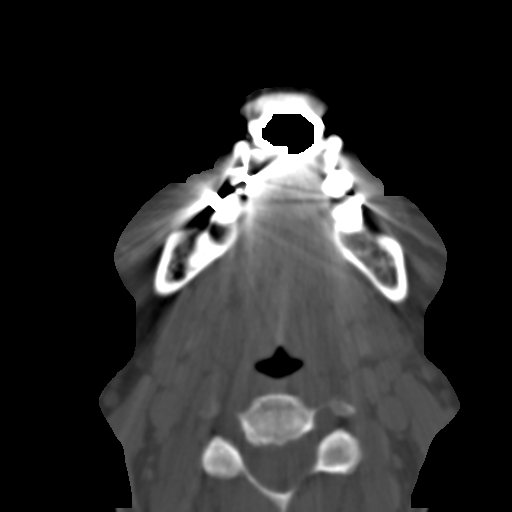
[im 31/82  bone]
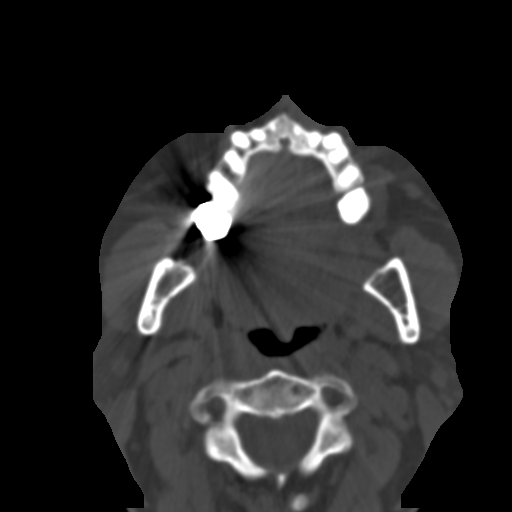
[im 42/82  brain]
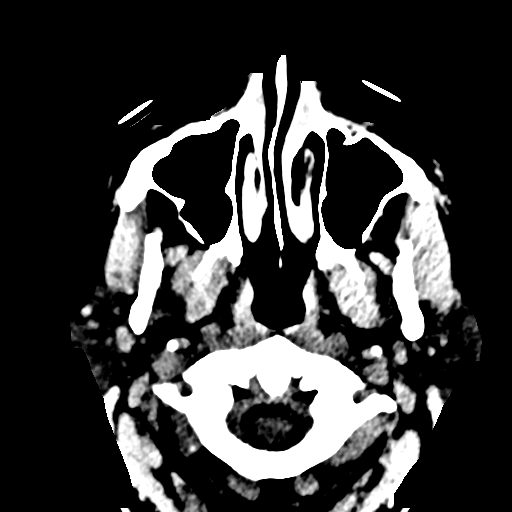
[im 42/82  bone]
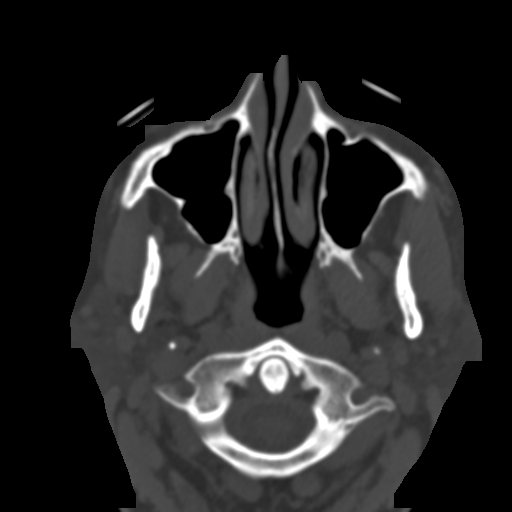
[im 51/82  bone]
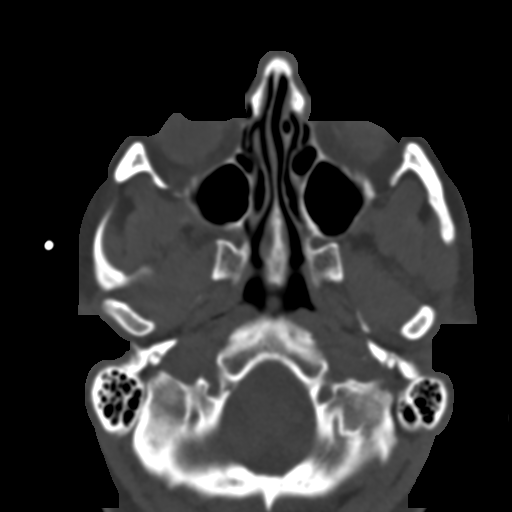
[im 59/82  bone]
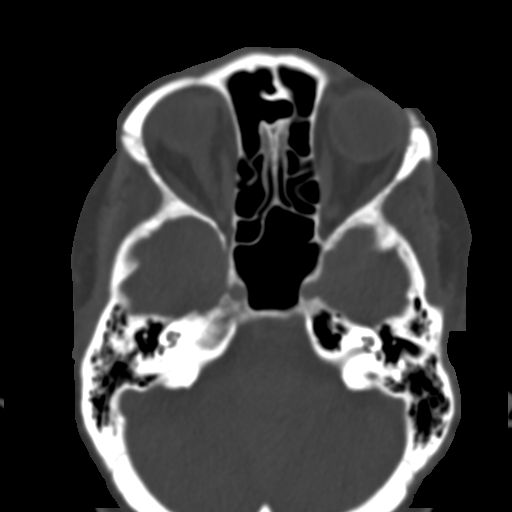
[im 68/82  bone]
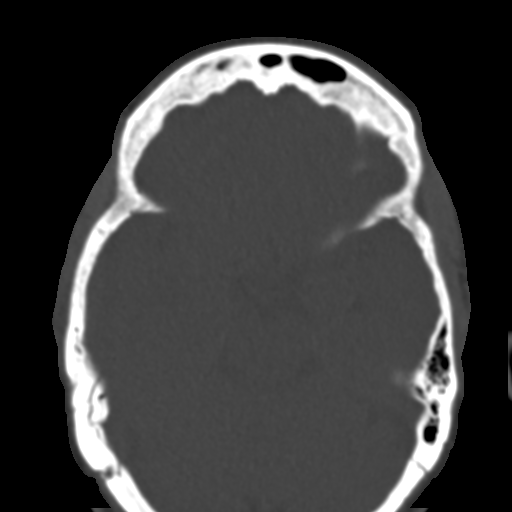
[im 76/82  brain]
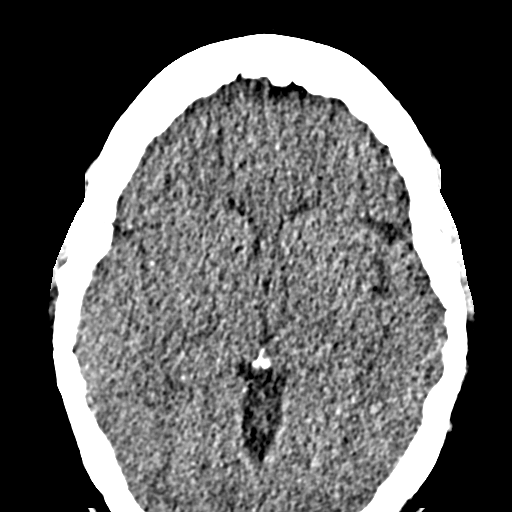
[im 76/82  bone]
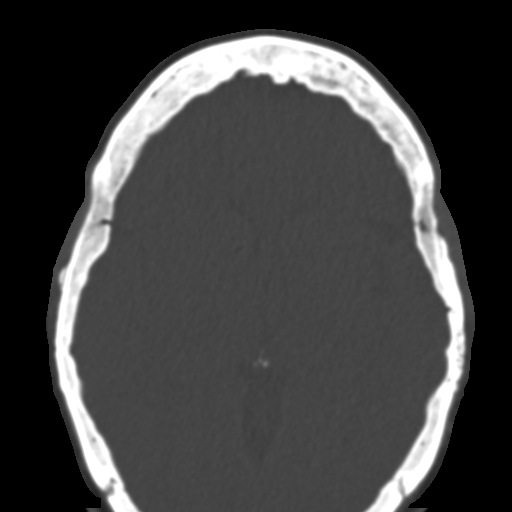

[Series 12: sagittal soft · sagittal · 0.32mm/px · 3 of 85 slices shown]
[im 29/85  bone]
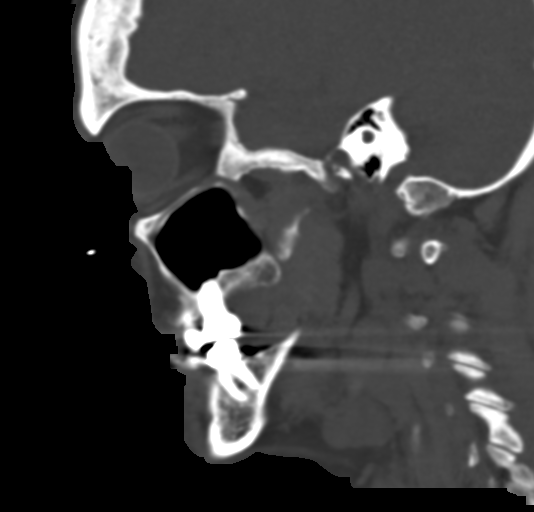
[im 43/85  bone]
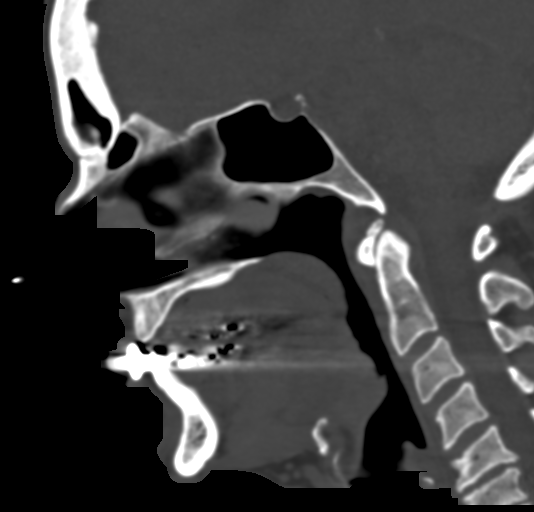
[im 57/85  bone]
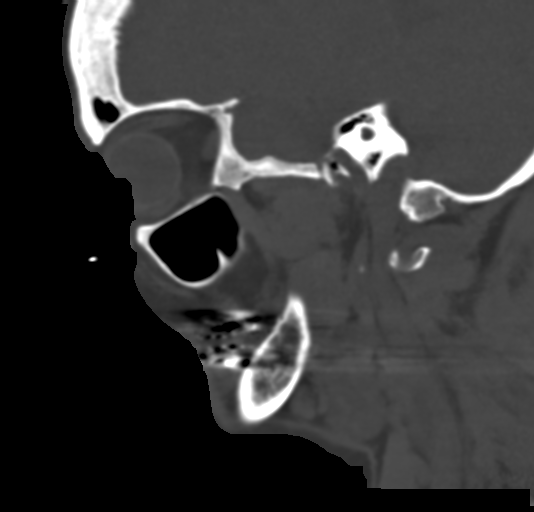

[Series 13: coronal bone · coronal · 0.33mm/px · 3 of 96 slices shown]
[im 24/96  bone]
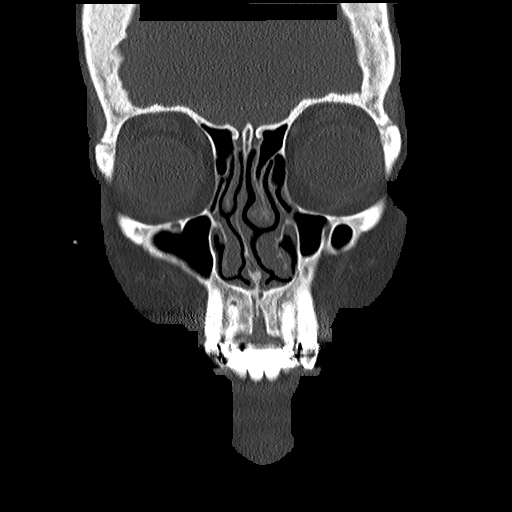
[im 48/96  bone]
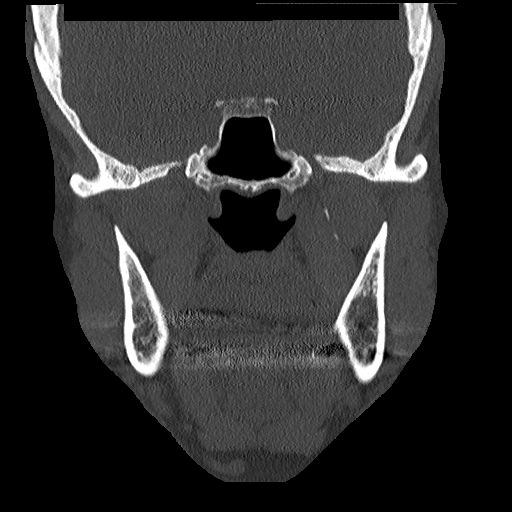
[im 72/96  bone]
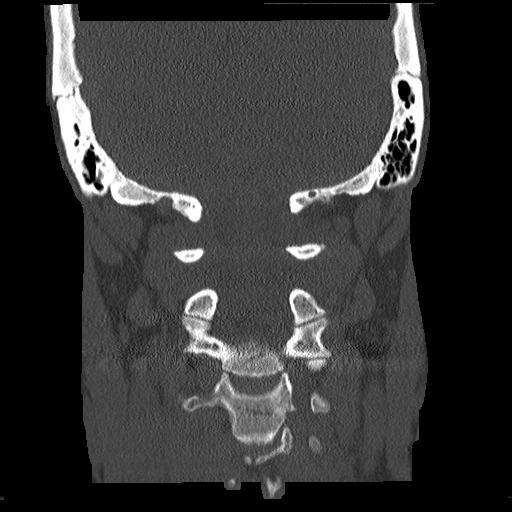

[15 of 47 positions shown; findings below may reference images not displayed]

FINDINGS: CT HEAD FINDINGS

Brain:

Cerebral volume is normal for age.

There is no acute intracranial hemorrhage.

No demarcated cortical infarct.

No extra-axial fluid collection.

No evidence of intracranial mass.

No midline shift.

Vascular: No hyperdense vessel.  Atherosclerotic calcifications.

Skull: Normal. Negative for fracture or focal lesion.

Other: No significant mastoid effusion.

CT MAXILLOFACIAL FINDINGS

Osseous: No acute maxillofacial fracture is identified.

Orbits: There is a subcentimeter focus of ill-defined hyperdensity
within the extraconal inferior right orbit, along the lateral aspect
of the inferior rectus muscle (series 11, image 30) (series 7, image
31). The globes are normal in size and contour. The extraocular
muscles and optic nerve sheath complexes are symmetric and
unremarkable.

Sinuses: No significant paranasal sinus disease.

Soft tissues: There is no definite periorbital or maxillofacial soft
tissue swelling appreciable by CT.
IMPRESSION: CT head:

No evidence of acute intracranial abnormality.

CT maxillofacial:

1. No evidence of acute maxillofacial fracture.
2. Subcentimeter focus of ill-defined hyperdensity within the
extraconal inferior right orbit, along the lateral aspect of the
inferior rectus muscle. This finding is nonspecific, but may reflect
a small amount of intraorbital hemorrhage given the provided history
of facial trauma with right periorbital ecchymosis.

## 2021-06-21 IMAGING — CT CT HEAD W/O CM
3 series · 15 of 47 positions shown, 18 images · non-contrast
Comparison: Brain MRI 12/06/2014.  Head CT 12/06/2014.

CLINICAL DATA: Facial trauma. Additional provided: Patient reports
walking into a wall today, causing her to fall backwards. Right
periorbital ecchymosis.

EXAM:
CT HEAD WITHOUT CONTRAST
CT MAXILLOFACIAL WITHOUT CONTRAST
TECHNIQUE: Multidetector CT imaging of the head and maxillofacial structures
were performed using the standard protocol without intravenous
contrast. Multiplanar CT image reconstructions of the maxillofacial
structures were also generated.

[Series 3: head w o · axial · 0.42mm/px · z∈[-96,+29]mm · 9 of 31 slices shown, 12 images]
[im 3/31  brain]
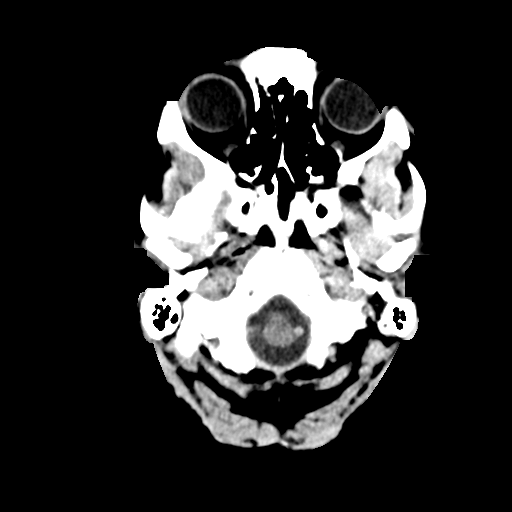
[im 3/31  bone]
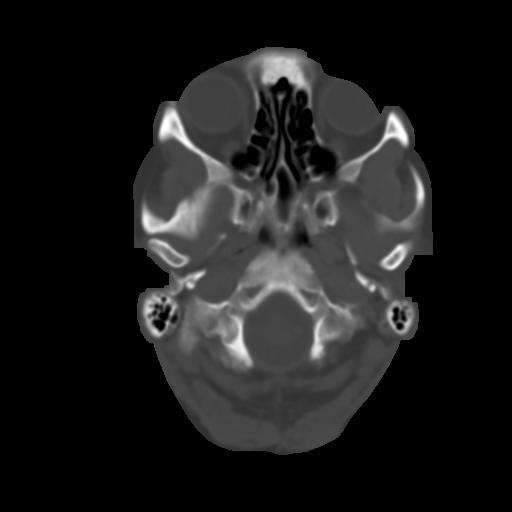
[im 6/31  brain]
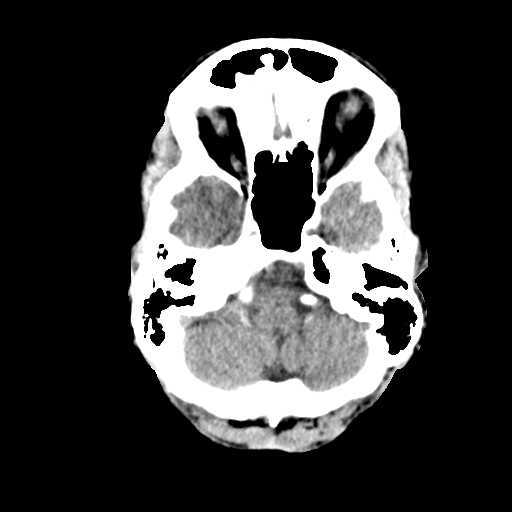
[im 9/31  brain]
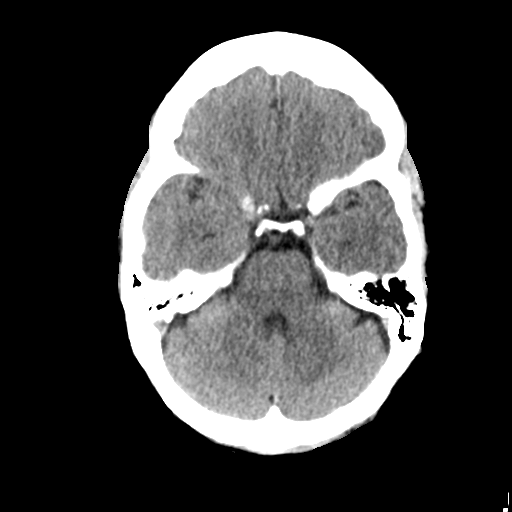
[im 12/31  brain]
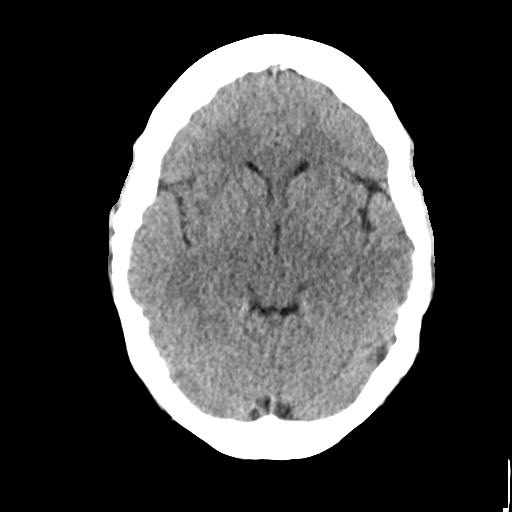
[im 16/31  brain]
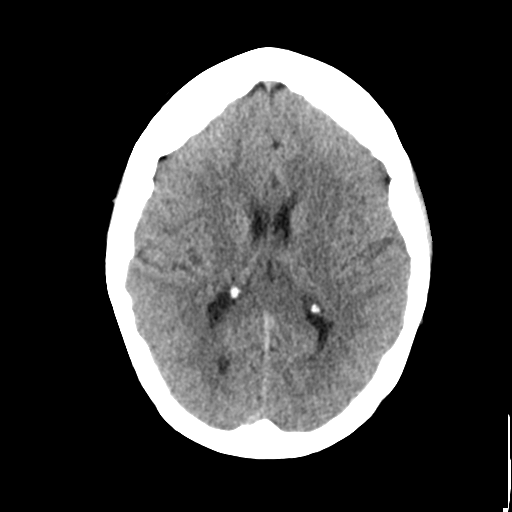
[im 16/31  bone]
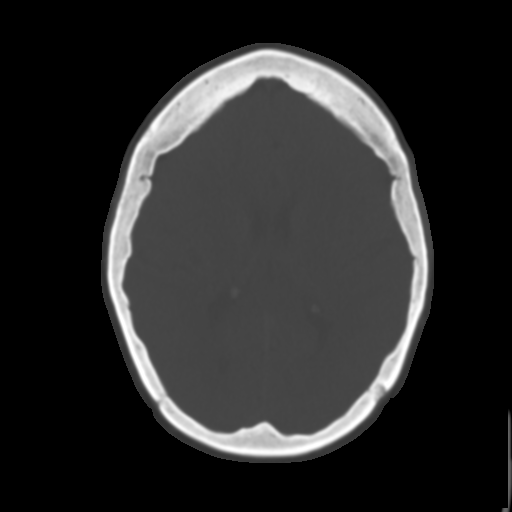
[im 19/31  brain]
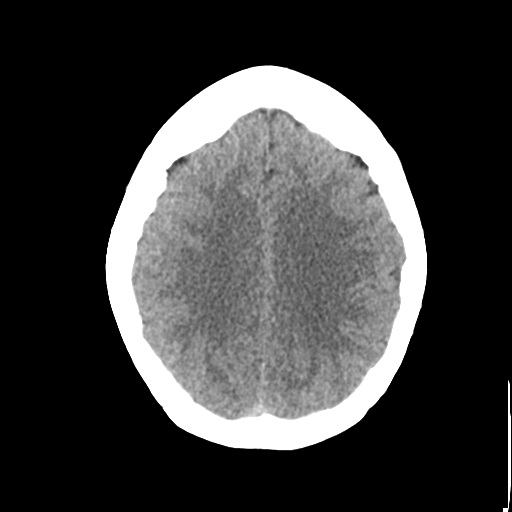
[im 22/31  brain]
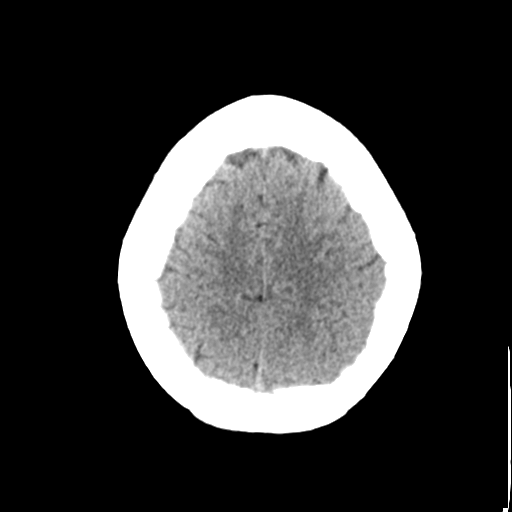
[im 25/31  brain]
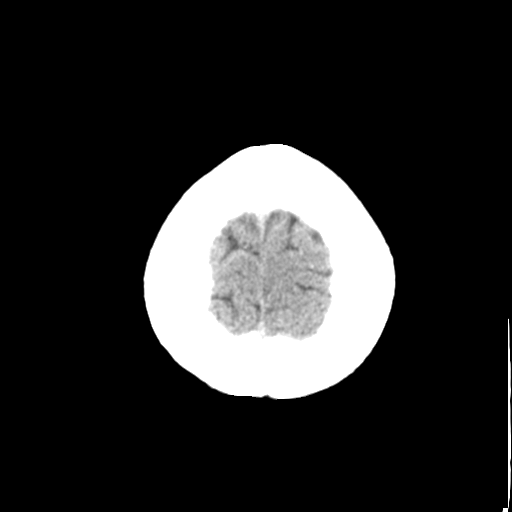
[im 28/31  brain]
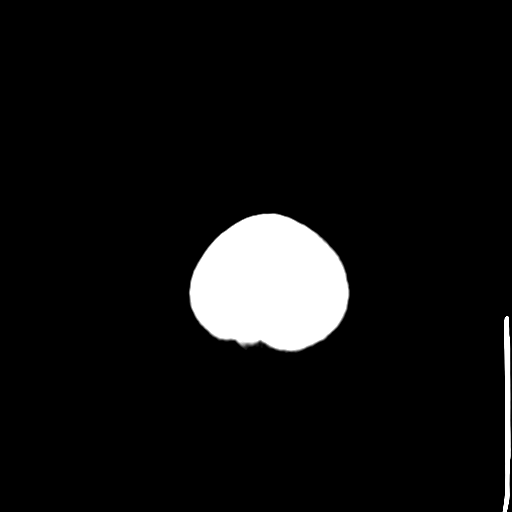
[im 28/31  bone]
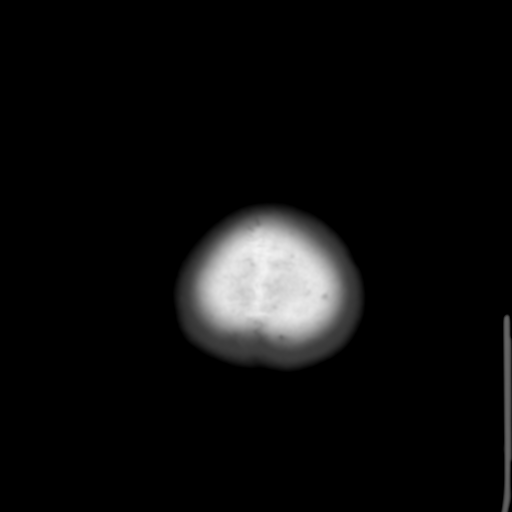

[Series 5: coronal soft · coronal · 0.31mm/px · 3 of 67 slices shown]
[im 23/67  brain]
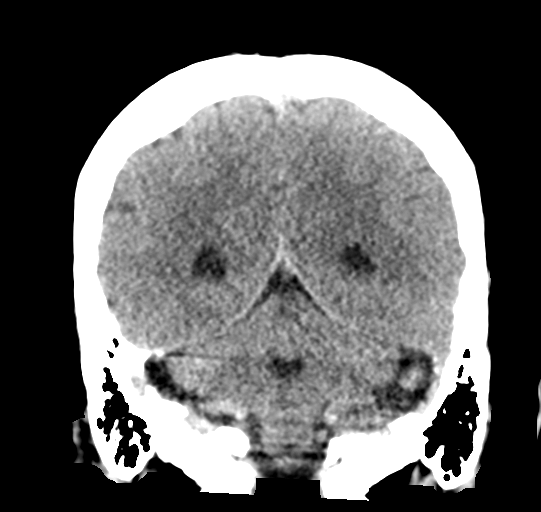
[im 30/67  brain]
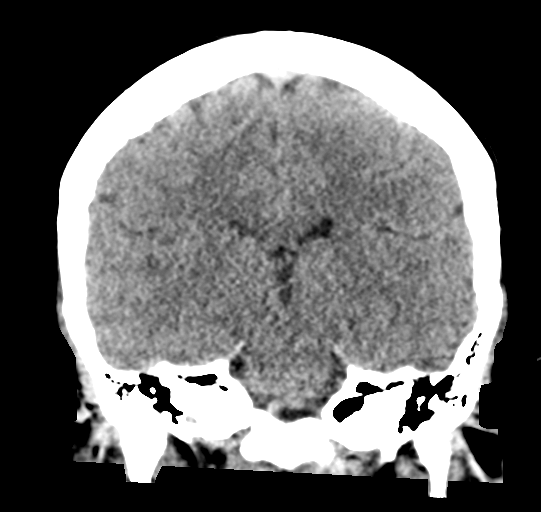
[im 37/67  brain]
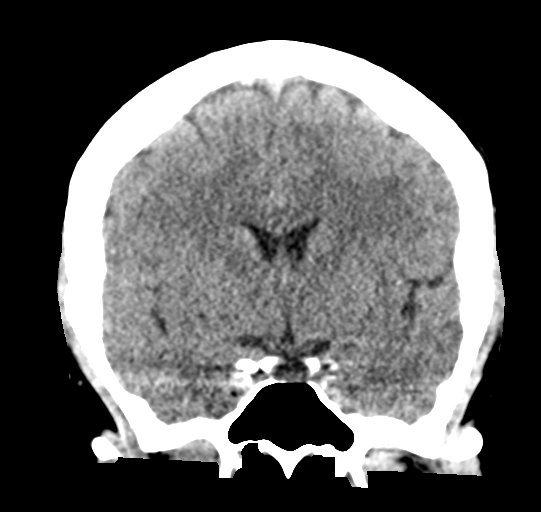

[Series 6: sagittal soft · sagittal · 0.31mm/px · 3 of 56 slices shown]
[im 19/56  brain]
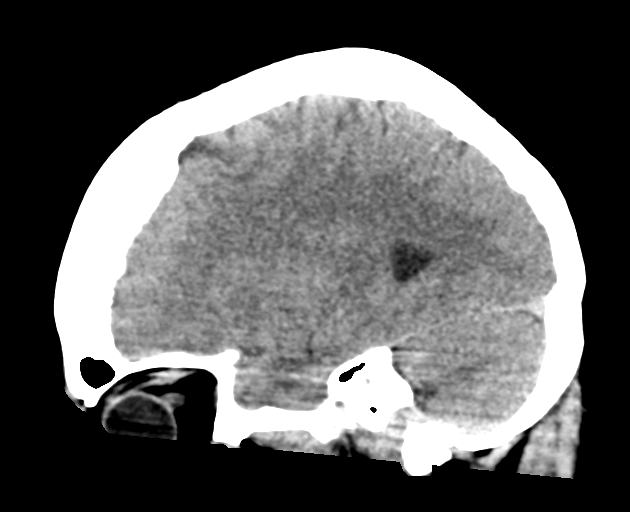
[im 28/56  brain]
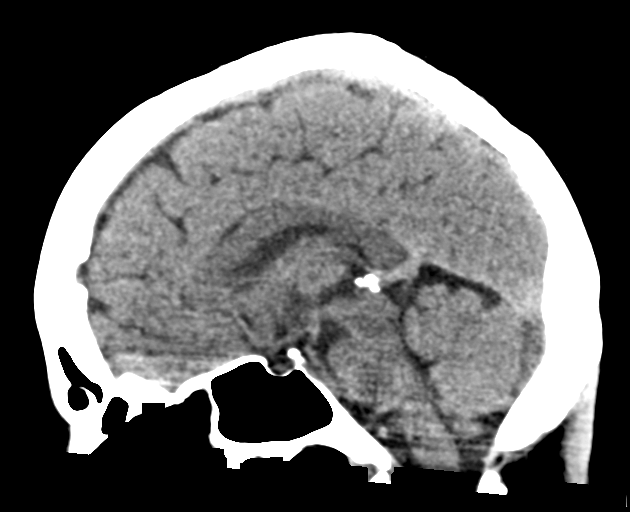
[im 37/56  brain]
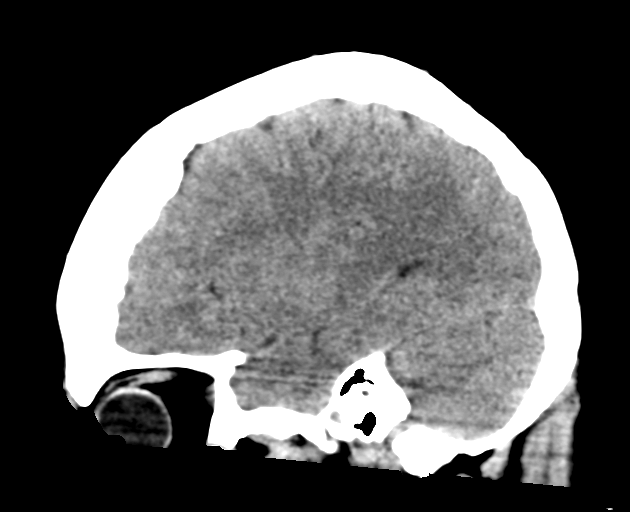

[15 of 47 positions shown; findings below may reference images not displayed]

FINDINGS: CT HEAD FINDINGS

Brain:

Cerebral volume is normal for age.

There is no acute intracranial hemorrhage.

No demarcated cortical infarct.

No extra-axial fluid collection.

No evidence of intracranial mass.

No midline shift.

Vascular: No hyperdense vessel.  Atherosclerotic calcifications.

Skull: Normal. Negative for fracture or focal lesion.

Other: No significant mastoid effusion.

CT MAXILLOFACIAL FINDINGS

Osseous: No acute maxillofacial fracture is identified.

Orbits: There is a subcentimeter focus of ill-defined hyperdensity
within the extraconal inferior right orbit, along the lateral aspect
of the inferior rectus muscle (series 11, image 30) (series 7, image
31). The globes are normal in size and contour. The extraocular
muscles and optic nerve sheath complexes are symmetric and
unremarkable.

Sinuses: No significant paranasal sinus disease.

Soft tissues: There is no definite periorbital or maxillofacial soft
tissue swelling appreciable by CT.
IMPRESSION: CT head:

No evidence of acute intracranial abnormality.

CT maxillofacial:

1. No evidence of acute maxillofacial fracture.
2. Subcentimeter focus of ill-defined hyperdensity within the
extraconal inferior right orbit, along the lateral aspect of the
inferior rectus muscle. This finding is nonspecific, but may reflect
a small amount of intraorbital hemorrhage given the provided history
of facial trauma with right periorbital ecchymosis.

## 2021-07-12 DIAGNOSIS — Z7689 Persons encountering health services in other specified circumstances: Secondary | ICD-10-CM | POA: Diagnosis not present

## 2021-07-31 ENCOUNTER — Ambulatory Visit: Payer: Medicare Other

## 2021-07-31 ENCOUNTER — Other Ambulatory Visit: Payer: Self-pay

## 2021-07-31 ENCOUNTER — Encounter: Payer: Self-pay | Admitting: Family Medicine

## 2021-07-31 ENCOUNTER — Ambulatory Visit (INDEPENDENT_AMBULATORY_CARE_PROVIDER_SITE_OTHER): Payer: Medicare Other | Admitting: Family Medicine

## 2021-07-31 VITALS — BP 133/85 | HR 64 | Temp 97.7°F | Ht 66.0 in | Wt 182.2 lb

## 2021-07-31 DIAGNOSIS — Z780 Asymptomatic menopausal state: Secondary | ICD-10-CM | POA: Diagnosis not present

## 2021-07-31 DIAGNOSIS — Z0001 Encounter for general adult medical examination with abnormal findings: Secondary | ICD-10-CM | POA: Diagnosis not present

## 2021-07-31 DIAGNOSIS — Z23 Encounter for immunization: Secondary | ICD-10-CM

## 2021-07-31 DIAGNOSIS — Z0000 Encounter for general adult medical examination without abnormal findings: Secondary | ICD-10-CM

## 2021-07-31 NOTE — Patient Instructions (Signed)
Thank you for coming in today for your Annual Medicare Wellness Visit.  Things that we discussed today are included in this packet.  Create and/or bring a copy of your Living Will/ Advanced Directive into the office so that we may respect your wishes should an emergency occur.  Get the recommended life-saving vaccines we discussed today.  Get your mammogram/ colonoscopy/ DEXA scans as directed by your provider.  Make sure that your medications are organized and safely stored.  Remember to always ask for help if you forget when/ how to take your medications.  Make healthy food choices ([place] in fruits/ veggies/ lean meats and low in salt, sugar and fat)  Do something that you enjoy for at least 30 minutes every day to stay active (walking, gardening, swimming, etc). This will help you lower your risk of falls/ broken bones.  Be social, do puzzles/ crosswords.  These things help the mind stay young and lower your risk of developing dementia.  Make sure that your home is safe by checking your smoke detectors regularly and doing the things outlined below to lower your risk of falls.  Fall Prevention in the Home Falls can cause injuries and can affect people from all age groups. There are many simple things that you can do to make your home safe and to help prevent falls. What can I do on the outside of my home? Regularly repair the edges of walkways and driveways and fix any cracks. Remove high doorway thresholds. Trim any shrubbery on the main path into your home. Use bright outdoor lighting. Clear walkways of debris and clutter, including tools and rocks. Regularly check that handrails are securely fastened and in good repair. Both sides of any steps should have handrails. Install guardrails along the edges of any raised decks or porches. Have leaves, snow, and ice cleared regularly. Use sand or salt on walkways during winter months. In the garage, clean up any spills right away, including  grease or oil spills. What can I do in the bathroom? Use night lights. Install grab bars by the toilet and in the tub and shower. Do not use towel bars as grab bars. Use non-skid mats or decals on the floor of the tub or shower. If you need to sit down while you are in the shower, use a plastic, non-slip stool. Keep the floor dry. Immediately clean up any water that spills on the floor. Remove soap buildup in the tub or shower on a regular basis. Attach bath mats securely with double-sided non-slip rug tape. Remove throw rugs and other tripping hazards from the floor. What can I do in the bedroom? Use night lights. Make sure that a bedside light is easy to reach. Do not use oversized bedding that drapes onto the floor. Have a firm chair that has side arms to use for getting dressed. Remove throw rugs and other tripping hazards from the floor. What can I do in the kitchen? Clean up any spills right away. Avoid walking on wet floors. Place frequently used items in easy-to-reach places. If you need to reach for something above you, use a sturdy step stool that has a grab bar. Keep electrical cables out of the way. Do not use floor polish or wax that makes floors slippery. If you have to use wax, make sure that it is non-skid floor wax. Remove throw rugs and other tripping hazards from the floor. What can I do in the stairways? Do not leave any items on the stairs.  Make sure that there are handrails on both sides of the stairs. Fix handrails that are broken or loose. Make sure that handrails are as long as the stairways. Check any carpeting to make sure that it is firmly attached to the stairs. Fix any carpet that is loose or worn. Avoid having throw rugs at the top or bottom of stairways, or secure the rugs with carpet tape to prevent them from moving. Make sure that you have a light switch at the top of the stairs and the bottom of the stairs. If you do not have them, have them  installed. What are some other fall prevention tips? Wear closed-toe shoes that fit well and support your feet. Wear shoes that have rubber soles or low heels. When you use a stepladder, make sure that it is completely opened and that the sides are firmly locked. Have someone hold the ladder while you are using it. Do not climb a closed stepladder. Add color or contrast paint or tape to grab bars and handrails in your home. Place contrasting color strips on the first and last steps. Use mobility aids as needed, such as canes, walkers, scooters, and crutches. Turn on lights if it is dark. Replace any light bulbs that burn out. Set up furniture so that there are clear paths. Keep the furniture in the same spot. Fix any uneven floor surfaces. Choose a carpet design that does not hide the edge of steps of a stairway. Be aware of any and all pets. Review your medicines with your healthcare provider. Some medicines can cause dizziness or changes in blood pressure, which increase your risk of falling. Talk with your health care provider about other ways that you can decrease your risk of falls. This may include working with a physical therapist or trainer to improve your strength, balance, and endurance. This information is not intended to replace advice given to you by your health care provider. Make sure you discuss any questions you have with your health care provider. Document Released: 08/23/2002 Document Revised: 01/30/2016 Document Reviewed: 10/07/2014 Elsevier Interactive Patient Education  2017 [employer].

## 2021-07-31 NOTE — Progress Notes (Signed)
Subjective:    [name] is a 70 y.o. female who presents for a Welcome to Medicare exam.   Ms Bezdek retired in September and has been really enjoying retirement.  She has not been to see her daughter in [place].  She and her husband have gone to [place].  They recently had 2 of their grandchildren from [place] staying with them for a couple of weeks.  She really enjoyed that.  She apparently has a new grandchild on the way.  She will be seeing her other daughter in [place] soon.  Plan is to spend Thanksgiving with family.  She admits that she has not been as physically active but does have an exercise bike at home that she plans on utilizing.  She has been trying to keep up with her 40+ house plants and they have been working on decluttering a little pond that they have on some land.  Apparently, the [place] have been building dams repeatedly.  Review of Systems Negative Cardiac Risk Factors include: advanced age (>59men, >77 women)      Objective:    Today's Vitals   07/31/21 1358  BP: 133/85  Pulse: 64  Temp: 97.7 F (36.5 C)  SpO2: 97%  Weight: 182 lb 3.2 oz (82.6 kg)  Height: 5\' 6"  (1.676 m)  Body mass index is 29.41 kg/m.  Medications Outpatient Encounter Medications as of 07/31/2021  Medication Sig   albuterol (PROVENTIL HFA;VENTOLIN HFA) 108 (90 Base) MCG/ACT inhaler Inhale 2 puffs into the lungs every 4 (four) hours as needed for wheezing or shortness of breath.   allopurinol (ZYLOPRIM) 300 MG tablet Take 1 tablet (300 mg total) by mouth every evening.   Ascorbic Acid (VITAMIN C PO) Take by mouth.   atenolol (TENORMIN) 50 MG tablet Take 1 tablet (50 mg total) by mouth daily.   BIOTIN PO Take by mouth.   budesonide (PULMICORT) 180 MCG/ACT inhaler Inhale 2 puffs into the lungs 2 (two) times daily.   cetirizine (ZYRTEC) 10 MG tablet Take 1 tablet by mouth daily.   cholecalciferol (VITAMIN D3) 25 MCG (1000 UT) tablet Take 1,000 Units by mouth daily.    esomeprazole (NEXIUM) 20 MG capsule Take 20 mg by mouth daily at 12 noon.   glucose blood (CONTOUR NEXT TEST) test strip Test BS daily and as needed Dx R73.09   Lancets 30G MISC Test BS daily and as needed Dx R73.09   MAGNESIUM PO Take by mouth.   montelukast (SINGULAIR) 10 MG tablet TAKE 1 TABLET(10 MG) BY MOUTH EVERY MORNING   Multiple Vitamin (MULTIVITAMIN) capsule Take 1 capsule by mouth daily.    sertraline (ZOLOFT) 50 MG tablet Take 1 tablet (50 mg total) by mouth daily.   potassium chloride SA (KLOR-CON) 20 MEQ tablet Take 2 tablets (40 mEq total) by mouth 2 (two) times daily for 10 days. (Patient not taking: Reported on 07/31/2021)   [DISCONTINUED] meclizine (ANTIVERT) 50 MG tablet Take 1 tablet (50 mg total) by mouth 3 (three) times daily as needed.   No facility-administered encounter medications on file as of 07/31/2021.     History: Past Medical History:  Diagnosis Date   Allergy    Asthma    Back pain    Cancer ([place])    skin cancer   Depression    Hyperlipidemia    Past Surgical History:  Procedure Laterality Date   bladder mesh     COLONOSCOPY N/A 07/12/2015   Procedure: COLONOSCOPY;  Surgeon: Mechele Dawley  Laural Golden, MD;  Location: [facility];  Service: Endoscopy;  Laterality: N/A;  [place]  02/2007   dipyridamole myoview; EF 70%, no wall motion abnormalities, no inducible ischemia, low risk    OVARIAN CYST REMOVAL  1987   TONSILECTOMY/ADENOIDECTOMY WITH MYRINGOTOMY  1958   TONSILLECTOMY     TRANSTHORACIC ECHOCARDIOGRAM  02/2007   RV mildly dlated; LV normal in size; mild MR with thickened MV leaflets; mild TR; mild pulm valve regurg   VAGINAL DELIVERY     x3    Family History  Problem Relation Age of Onset   Heart disease Mother    Kidney disease Mother    Breast cancer Mother    Coronary artery disease Mother        CABG at age 10   Dementia Father    Stroke Father    Stroke Maternal Grandmother    Cancer Maternal Grandfather     Cancer Paternal Grandmother    Kidney disease Paternal Grandfather    Social History   Occupational History   Not on file  Tobacco Use   Smoking status: Never   Smokeless tobacco: Never  Vaping Use   Vaping Use: Never used  Substance and Sexual Activity   Alcohol use: No   Drug use: No   Sexual activity: Not on file    Tobacco Counseling Does not smoke   Immunizations and Health Maintenance Immunization History  Administered Date(s) Administered   Fluad Quad(high Dose 65+) 08/22/2017, 05/14/2019, 08/14/2020   Influenza, High Dose Seasonal PF 06/12/2016, 08/22/2017, 06/20/2018   Influenza-Unspecified 09/12/2014, 06/12/2016, 08/22/2017, 06/20/2018, 05/29/2019   Moderna Sars-Covid-2 Vaccination 11/11/2019, 12/10/2019   Pneumococcal Conjugate-13 08/15/2016   Pneumococcal Polysaccharide-23 07/13/2019   Tdap 02/08/2020   Zoster Recombinat (Shingrix) 10/14/2019, 01/14/2020   Zoster, Live 12/04/2012   Health Maintenance Due  Topic Date Due   DEXA SCAN  Never done   INFLUENZA VACCINE  04/16/2021    Activities of Daily Living In your present state of health, do you have any difficulty performing the following activities: 07/31/2021  Hearing? N  Vision? N  Difficulty concentrating or making decisions? N  Walking or climbing stairs? N  Dressing or bathing? N  Doing errands, shopping? N  Preparing Food and eating ? N  Using the Toilet? N  In the past six months, have you accidently leaked urine? N  Do you have problems with loss of bowel control? N  Managing your Medications? N  Managing your Finances? N  Housekeeping or managing your Housekeeping? N  Some recent data might be hidden    Physical Exam   Gen: Well-appearing female.  No acute distress Cardio: Regular rate and rhythm.  S1-S2 heard.  No murmurs Pulmonary: Normal work breathing on room air.  Clear to auscultation bilaterally. Psych: Mood stable.  Thought process linear.  Pleasant and  interactive  Advanced Directives: Does Patient Have a Medical Advance Directive?: No Would patient like information on creating a medical advance directive?: Yes (MAU/Ambulatory/Procedural Areas - Information given)    Assessment:    This is a routine wellness examination for this patient .  Vision/Hearing screen Wears glasses.  Dietary issues and exercise activities discussed:  Current Exercise Habits: The patient does not participate in regular exercise at present, Exercise limited by: None identified   Goals      Increase physical activity       Depression Screen [facility] 2/9 Scores 04/23/2021 04/10/2021 08/14/2020 02/08/2020  PHQ -  2 Score 0 0 0 0  PHQ- 9 Score 0 - 0 -     Fall Risk Fall Risk  07/31/2021  Falls in the past year? 0  Number falls in past yr: 0  Injury with Fall? -  Risk for fall due to : History of fall(s)  Follow up Falls prevention discussed    Cognitive Function: MMSE - [place] Exam 07/31/2021 12/09/2014  Orientation to time 5 5  Orientation to Place 5 5  Registration 3 3  Attention/ Calculation 5 5  Recall 3 2  Language- name 2 objects 2 2  Language- repeat 1 1  Language- follow 3 step command 3 3  Language- read & follow direction 1 1  Write a sentence 1 1  Copy design 1 1  Total score 30 29        Patient Care Team: Janora Norlander, DO as PCP - General (Family Medicine) Debara Pickett Nadean Corwin, MD as PCP - Cardiology (Cardiology)     Plan:     Welcome to Medicare preventive visit  Asymptomatic postmenopausal estrogen deficiency - Plan: DG [facility] DEXA  Influenza vaccination administered.  DEXA scan ordered. I have given her a handout for advanced directive.  We discussed CODE STATUS and she will discuss this further with her spouse  I have personally reviewed and noted the following in the patient's chart:   Medical and social history Use of alcohol, tobacco or illicit drugs  Current medications and supplements Functional  ability and status Nutritional status Physical activity Advanced directives List of other physicians Hospitalizations, surgeries, and ER visits in previous 12 months Vitals Screenings to include cognitive, depression, and falls Referrals and appointments  In addition, I have reviewed and discussed with patient certain preventive protocols, quality metrics, and best practice recommendations. A written personalized care plan for preventive services as well as general preventive health recommendations were provided to patient.     Ronnie Doss, DO 07/31/2021

## 2021-09-11 ENCOUNTER — Ambulatory Visit (INDEPENDENT_AMBULATORY_CARE_PROVIDER_SITE_OTHER): Payer: Medicare Other | Admitting: Family Medicine

## 2021-09-11 ENCOUNTER — Encounter: Payer: Self-pay | Admitting: Family Medicine

## 2021-09-11 ENCOUNTER — Other Ambulatory Visit: Payer: Self-pay | Admitting: Family Medicine

## 2021-09-11 DIAGNOSIS — B9689 Other specified bacterial agents as the cause of diseases classified elsewhere: Secondary | ICD-10-CM | POA: Diagnosis not present

## 2021-09-11 DIAGNOSIS — J208 Acute bronchitis due to other specified organisms: Secondary | ICD-10-CM | POA: Diagnosis not present

## 2021-09-11 MED ORDER — CEFDINIR 300 MG PO CAPS: 300.0000 mg | ORAL_CAPSULE | Freq: Two times a day (BID) | ORAL | 0 refills | Status: DC

## 2021-09-11 MED ORDER — ALBUTEROL SULFATE HFA 108 (90 BASE) MCG/ACT IN AERS: 2.0000 | INHALATION_SPRAY | Freq: Four times a day (QID) | RESPIRATORY_TRACT | 0 refills | Status: DC | PRN

## 2021-09-11 MED ORDER — BENZONATATE 100 MG PO CAPS: 100.0000 mg | ORAL_CAPSULE | Freq: Three times a day (TID) | ORAL | 0 refills | Status: DC | PRN

## 2021-09-11 NOTE — Progress Notes (Signed)
Telephone visit  Subjective: CC:URI PCP: Janora Norlander, DO [id number] LEBA TIBBITTS is a 70 y.o. female calls for telephone consult today. Patient provides verbal consent for consult held via phone.  Due to COVID-19 pandemic this visit was conducted virtually. This visit type was conducted due to national recommendations for restrictions regarding the COVID-19 Pandemic (e.g. social distancing, sheltering in place) in an effort to limit this patient's exposure and mitigate transmission in our community. All issues noted in this document were discussed and addressed.  A physical exam was not performed with this format.   Location of patient: car Location of provider: [facility] Others present for call: none  1. URI She reports that last week, her spouse was sick with URI.  She notes that Thursday and Friday she started having a mild cough.  Yesterday, she developed a sore throat and laryngitis.  She reports yellow sputum.  No shortness of breath, wheezing or fevers.  She has been negative by home COVID test.  She does report difficulty taking a deep breath in but she notes that she has not utilized her inhaler at all.  No reports of myalgia, diarrhea, nausea or vomiting.  Utilizing Mucinex.  Medical history significant for asthma.   ROS: Per HPI  Allergies  Allergen Reactions   Bee Venom Swelling   Crestor [Rosuvastatin] Other (See Comments)    Myalgias   Erythromycin Itching, Rash and Other (See Comments)   Latex Itching and Rash    rash   Rosuvastatin Calcium Other (See Comments)    Myalgias   Pravastatin Other (See Comments)    Pain in joints.   Zetia [Ezetimibe]    Aleve [Naproxen Sodium]     Gastritis   Tetracyclines & Related Other (See Comments)    unknown   Vytorin [Ezetimibe-Simvastatin] Other (See Comments)    unknown   Past Medical History:  Diagnosis Date   Allergy    Asthma    Back pain    Cancer ([place])    skin cancer   Depression    Hyperlipidemia     Current  Outpatient Medications:    albuterol (PROVENTIL HFA;VENTOLIN HFA) 108 (90 Base) MCG/ACT inhaler, Inhale 2 puffs into the lungs every 4 (four) hours as needed for wheezing or shortness of breath., Disp: 1 Inhaler, Rfl: 0   allopurinol (ZYLOPRIM) 300 MG tablet, Take 1 tablet (300 mg total) by mouth every evening., Disp: 90 tablet, Rfl: 3   Ascorbic Acid (VITAMIN C PO), Take by mouth., Disp: , Rfl:    atenolol (TENORMIN) 50 MG tablet, Take 1 tablet (50 mg total) by mouth daily., Disp: 90 tablet, Rfl: 1   BIOTIN PO, Take by mouth., Disp: , Rfl:    budesonide (PULMICORT) 180 MCG/ACT inhaler, Inhale 2 puffs into the lungs 2 (two) times daily., Disp: , Rfl:    cetirizine (ZYRTEC) 10 MG tablet, Take 1 tablet by mouth daily., Disp: , Rfl:    cholecalciferol (VITAMIN D3) 25 MCG (1000 UT) tablet, Take 1,000 Units by mouth daily., Disp: , Rfl:    esomeprazole (NEXIUM) 20 MG capsule, Take 20 mg by mouth daily at 12 noon., Disp: , Rfl:    glucose blood (CONTOUR NEXT TEST) test strip, Test BS daily and as needed Dx R73.09, Disp: 100 each, Rfl: 3   Lancets 30G MISC, Test BS daily and as needed Dx R73.09, Disp: 100 each, Rfl: 3   MAGNESIUM PO, Take by mouth., Disp: , Rfl:    montelukast (SINGULAIR) 10 MG tablet,  TAKE 1 TABLET(10 MG) BY MOUTH EVERY MORNING, Disp: 90 tablet, Rfl: 1   Multiple Vitamin (MULTIVITAMIN) capsule, Take 1 capsule by mouth daily. , Disp: , Rfl:    potassium chloride SA (KLOR-CON) 20 MEQ tablet, Take 2 tablets (40 mEq total) by mouth 2 (two) times daily for 10 days. (Patient not taking: Reported on 07/31/2021), Disp: 40 tablet, Rfl: 0   sertraline (ZOLOFT) 50 MG tablet, Take 1 tablet (50 mg total) by mouth daily., Disp: 30 tablet, Rfl: 5  Assessment/ Plan: 70 y.o. female   Acute bacterial bronchitis - Plan: albuterol (VENTOLIN HFA) 108 (90 Base) MCG/ACT inhaler, cefdinir (OMNICEF) 300 MG capsule, benzonatate (TESSALON PERLES) 100 MG capsule  Going to empirically treat her with [facility] for  presumed bacterial bronchitis.  I renewed her albuterol inhaler and prescribed her Tessalon Perles.  She did not endorse any wheezing and therefore I did not send in prednisone but we discussed red flag signs and symptoms warranting further evaluation.  She voiced good understanding will follow up as needed  Start time: 11:24am End time: 11:31am  Total time spent on patient care (including telephone call/ virtual visit): 7 minutes  [place], [place] [phone]

## 2021-09-19 ENCOUNTER — Other Ambulatory Visit: Payer: Self-pay | Admitting: Family Medicine

## 2021-09-26 DIAGNOSIS — M7989 Other specified soft tissue disorders: Secondary | ICD-10-CM | POA: Diagnosis not present

## 2021-09-26 DIAGNOSIS — M130 Polyarthritis, unspecified: Secondary | ICD-10-CM | POA: Diagnosis not present

## 2021-09-26 DIAGNOSIS — M1A09X0 Idiopathic chronic gout, multiple sites, without tophus (tophi): Secondary | ICD-10-CM | POA: Diagnosis not present

## 2021-11-16 ENCOUNTER — Ambulatory Visit: Payer: Medicare Other | Admitting: General Practice

## 2021-11-20 DIAGNOSIS — E782 Mixed hyperlipidemia: Secondary | ICD-10-CM | POA: Diagnosis not present

## 2021-11-21 LAB — LIPID PANEL
Chol/HDL Ratio: 4.9 ratio — ABNORMAL HIGH (ref 0.0–4.4)
Cholesterol, Total: 224 mg/dL — ABNORMAL HIGH (ref 100–199)
HDL: 46 mg/dL (ref >39)
LDL Chol Calc (NIH): 144 mg/dL — ABNORMAL HIGH (ref 0–99)
Triglycerides: 188 mg/dL — ABNORMAL HIGH (ref 0–149)
VLDL Cholesterol Cal: 34 mg/dL (ref 5–40)

## 2021-11-21 NOTE — Progress Notes (Signed)
Cardiology Clinic Note   Patient Name: [name] Date of Encounter: 11/26/2021  Primary Care Provider:  Janora Norlander, DO Primary Cardiologist:  Pixie Casino, MD  Patient Profile    [name] 71 year old female presents the clinic today for follow-up evaluation of her essential hypertension and HLD.  Past Medical History    Past Medical History:  Diagnosis Date   Allergy    Asthma    Back pain    Cancer ([place])    skin cancer   Depression    Hyperlipidemia    Past Surgical History:  Procedure Laterality Date   bladder mesh     COLONOSCOPY N/A 07/12/2015   Procedure: COLONOSCOPY;  Surgeon: Rogene Houston, MD;  Location: [facility];  Service: Endoscopy;  Laterality: N/A;  [place]  02/2007   dipyridamole myoview; EF 70%, no wall motion abnormalities, no inducible ischemia, low risk    OVARIAN CYST REMOVAL  1987   TONSILECTOMY/ADENOIDECTOMY WITH MYRINGOTOMY  1958   TONSILLECTOMY     TRANSTHORACIC ECHOCARDIOGRAM  02/2007   RV mildly dlated; LV normal in size; mild MR with thickened MV leaflets; mild TR; mild pulm valve regurg   VAGINAL DELIVERY     x3    Allergies  Allergies  Allergen Reactions   Bee Venom Swelling   Crestor [Rosuvastatin] Other (See Comments)    Myalgias   Erythromycin Itching, Rash and Other (See Comments)   Latex Itching and Rash    rash   Rosuvastatin Calcium Other (See Comments)    Myalgias   Pravastatin Other (See Comments)    Pain in joints.   Zetia [Ezetimibe]    Aleve [Naproxen Sodium]     Gastritis   Tetracyclines & Related Other (See Comments)    unknown   Vytorin [Ezetimibe-Simvastatin] Other (See Comments)    unknown    History of Present Illness    [name] has a PMH of HTN, asthma, GERD, transient memory loss, HLD, atypical chest pain, fatigue, palpitations, and family history of heart disease.  She is a former patient of Dr. Janene Madeira.  She was last seen by Dr. Debara Pickett  on 05/29/2021.  During that time she was stable from a cardiac standpoint.  She was noted to be statin intolerant.  Her cholesterol total was 253 with an LDL cholesterol 165.  She was noted to have no coronary calcium in 2019.  She wished to continue to work on her diet and physical activity.  She was recently retired.  Treatment with red yeast rice was discussed.  Ezetimibe was previously not tolerated well.  Her EKG showed normal sinus rhythm.  She was previously noted to have trouble with dizziness and was found to be hypokalemic.  Her chlorthalidone was discontinued.  Her atenolol was continued.  She presents to the clinic today for follow-up evaluation states she feels well.  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  She does not feel that she hydrated as well during that time.  She has not had any further episodes of dizziness.  We reviewed her lipid panel from 11/20/2021.  She expressed understanding.  She has had slight improvement in her LDL to 144.  She reports that she has been improving her diet but has not increased physical activity as much as she feels she can.  We reviewed Praluent, Repatha, and inclisiran.  She does  not wish to start any other medications at this time.  She feels that she would like to continue to work on her diet and exercise.  She reports that she does not trust new medications.  We will plan follow-up for 6 months.  Today she denies chest pain, shortness of breath, lower extremity edema, fatigue, palpitations, melena, hematuria, hemoptysis, diaphoresis, weakness, presyncope, syncope, orthopnea, and PND.     Home Medications    Prior to Admission medications   Medication Sig Start Date End Date Taking? Authorizing Provider  albuterol (VENTOLIN HFA) 108 (90 Base) MCG/ACT inhaler Inhale 2 puffs into the lungs every 6 (six) hours as needed for wheezing or shortness of breath. 09/11/21   Janora Norlander, DO  allopurinol  (ZYLOPRIM) 300 MG tablet Take 1 tablet (300 mg total) by mouth every evening. 06/11/21   Ronnie Doss M, DO  Ascorbic Acid (VITAMIN C PO) Take by mouth.    [provider]  atenolol (TENORMIN) 50 MG tablet Take 1 tablet (50 mg total) by mouth daily. 05/22/21 05/22/22  Hilty, Nadean Corwin, MD  benzonatate (TESSALON PERLES) 100 MG capsule Take 1 capsule (100 mg total) by mouth 3 (three) times daily as needed. 09/11/21   Ronnie Doss M, DO  BIOTIN PO Take by mouth.    [provider]  budesonide (PULMICORT) 180 MCG/ACT inhaler Inhale 2 puffs into the lungs 2 (two) times daily.    [provider]  cefdinir (OMNICEF) 300 MG capsule Take 1 capsule (300 mg total) by mouth 2 (two) times daily. 1 po BID 09/11/21   Ronnie Doss M, DO  cetirizine (ZYRTEC) 10 MG tablet Take 1 tablet by mouth daily.    [provider]  cholecalciferol (VITAMIN D3) 25 MCG (1000 UT) tablet Take 1,000 Units by mouth daily.    [provider]  esomeprazole (NEXIUM) 20 MG capsule Take 20 mg by mouth daily at 12 noon.    [provider]  glucose blood (CONTOUR NEXT TEST) test strip Test BS daily and as needed Dx R73.09 08/18/20   Janora Norlander, DO  Lancets 30G MISC Test BS daily and as needed Dx R73.09 08/18/20   Ronnie Doss M, DO  MAGNESIUM PO Take by mouth.    [provider]  montelukast (SINGULAIR) 10 MG tablet Take 1 tablet (10 mg total) by mouth in the morning. 09/19/21   Janora Norlander, DO  Multiple Vitamin (MULTIVITAMIN) capsule Take 1 capsule by mouth daily.     [provider]  potassium chloride SA (KLOR-CON) 20 MEQ tablet Take 2 tablets (40 mEq total) by mouth 2 (two) times daily for 10 days. Patient not taking: Reported on 07/31/2021 04/11/21 04/21/21  Sharion Balloon, FNP  sertraline (ZOLOFT) 50 MG tablet Take 1 tablet (50 mg total) by mouth daily. 03/26/21   Pieter Partridge, DO    Family History    Family History  Problem  Relation Age of Onset   Heart disease Mother    Kidney disease Mother    Breast cancer Mother    Coronary artery disease Mother        CABG at age 71   Dementia Father    Stroke Father    Stroke Maternal Grandmother    Cancer Maternal Grandfather    Cancer Paternal Grandmother    Kidney disease Paternal Grandfather    She indicated that her mother is deceased. She indicated that her father is deceased. She indicated that her maternal grandmother  is deceased. She indicated that her maternal grandfather is deceased. She indicated that her paternal grandmother is deceased. She indicated that her paternal grandfather is deceased.  Social History    Social History   Socioeconomic History   Marital status: Married    Spouse name: Not on file   Number of children: Not on file   Years of education: Not on file   Highest education level: Not on file  Occupational History   Not on file  Tobacco Use   Smoking status: Never   Smokeless tobacco: Never  Vaping Use   Vaping Use: Never used  Substance and Sexual Activity   Alcohol use: No   Drug use: No   Sexual activity: Not on file  Other Topics Concern   Not on file  Social History Narrative   Not on file   Social Determinants of Health   Financial Resource Strain: Low Risk    Difficulty of Paying Living Expenses: Not very hard  Food Insecurity: No Food Insecurity   Worried About Running Out of Food in the Last Year: Never true   Ran Out of Food in the Last Year: Never true  Transportation Needs: No Transportation Needs   Lack of Transportation (Medical): No   Lack of Transportation (Non-Medical): No  Physical Activity: Insufficiently Active   Days of Exercise per Week: 2 days   Minutes of Exercise per Session: 30 min  Stress: Not on file  Social Connections: Socially Integrated   Frequency of Communication with Friends and Family: More than three times a week   Frequency of Social Gatherings with Friends and Family: More  than three times a week   Attends Religious Services: More than 4 times per year   Active Member of [employer] or Organizations: Yes   Attends Music therapist: More than 4 times per year   Marital Status: Married  Human resources officer Violence: Not on file     Review of Systems    General:  No chills, fever, night sweats or weight changes.  Cardiovascular:  No chest pain, dyspnea on exertion, edema, orthopnea, palpitations, paroxysmal nocturnal dyspnea. Dermatological: No rash, lesions/masses Respiratory: No cough, dyspnea Urologic: No hematuria, dysuria Abdominal:   No nausea, vomiting, diarrhea, bright red blood per rectum, melena, or hematemesis Neurologic:  No visual changes, wkns, changes in mental status. All other systems reviewed and are otherwise negative except as noted above.  Physical Exam    VS:  BP 128/80 (BP Location: Left Arm, Patient Position: Sitting, Cuff Size: Normal)    Pulse 60    Ht '5\' 6"'$  (1.676 m)    Wt 181 lb (82.1 kg)    BMI 29.21 kg/m  , BMI Body mass index is 29.21 kg/m. GEN: Well nourished, well developed, in no acute distress. HEENT: normal. Neck: Supple, no JVD, carotid bruits, or masses. Cardiac: RRR, no murmurs, rubs, or gallops. No clubbing, cyanosis, edema.  Radials/DP/PT 2+ and equal bilaterally.  Respiratory:  Respirations regular and unlabored, clear to auscultation bilaterally. GI: Soft, nontender, nondistended, BS + x 4. MS: no deformity or atrophy. Skin: warm and dry, no rash. Neuro:  Strength and sensation are intact. Psych: Normal affect.  Accessory Clinical Findings    Recent Labs: 04/10/2021: ALT 19; Hemoglobin 13.8; Platelets 202; TSH 2.650 04/23/2021: BUN 16; Creatinine, Ser 0.98; Potassium 4.7; Sodium 143   Recent Lipid Panel    Component Value Date/Time   CHOL 224 (H) 11/20/2021 1110   TRIG 188 (H) 11/20/2021 1110  HDL 46 11/20/2021 1110   CHOLHDL 4.9 (H) 11/20/2021 1110   LDLCALC 144 (H) 11/20/2021 1110    ECG  personally reviewed by me today- none today.  Echo 12/15/14 Study Conclusions   - Left ventricle: The cavity size was normal. Systolic function was    normal. The estimated ejection fraction was in the range of 60%    to 65%. Wall motion was normal; there were no regional wall    motion abnormalities. Doppler parameters are consistent with    abnormal left ventricular relaxation (grade 1 diastolic    dysfunction). Doppler parameters are consistent with high    ventricular filling pressure.  - Pulmonary arteries: PA peak pressure: 36 mm Hg (S).   Impressions:   - No cardiac source of emboli was indentified.   Assessment & Plan   1.  Essential hypertension-BP today 128/80.  Well-controlled at home. Continue atenolol Heart healthy low-sodium diet-salty 6 given Increase physical activity as tolerated  Hyperlipidemia-LDL 144.  Intolerant of statins and ezetimibe.  Reviewed options of Praluent, Repatha, inclisiran.  She wishes to continue to work on diet and exercise at this time.   Heart healthy low-sodium high-fiber diet.   Increase physical activity as tolerated-goal 150 minutes of moderate physical activity per week.  Palpitations-no recent episodes of irregular or accelerated heartbeat. Continue atenolol Heart healthy low-sodium diet-salty 6 given Increase physical activity as tolerated Avoid triggers caffeine, chocolate, EtOH, dehydration etc.  Aortic atherosclerosis-noted on 02/23/2019 chest CT.  Coronary calcium score 03/19. Heart healthy low-sodium high-fiber diet Increase physical activity as tolerated No plans for ischemic evaluation  Disposition: Follow-up with Dr. Debara Pickett or me in 6 months.  Jossie Ng. Wylee Dorantes NP-C    11/26/2021, 10:50 AM [address] Office [phone] Fax [phone]  Notice: This dictation was prepared with Dragon dictation along with smaller phrase technology. Any transcriptional errors that  result from this process are unintentional and may not be corrected upon review.  I spent 14 minutes examining this patient, reviewing medications, and using patient centered shared decision making involving her cardiac care.  Prior to her visit I spent greater than 20 minutes reviewing her past medical history,  medications, and prior cardiac tests.

## 2021-11-26 ENCOUNTER — Ambulatory Visit: Payer: Medicare Other | Admitting: General Practice

## 2021-11-26 ENCOUNTER — Encounter: Payer: Self-pay | Admitting: General Practice

## 2021-11-26 ENCOUNTER — Other Ambulatory Visit: Payer: Self-pay

## 2021-11-26 VITALS — BP 128/80 | HR 60 | Ht 66.0 in | Wt 181.0 lb

## 2021-11-26 DIAGNOSIS — I700 Atherosclerosis of aorta: Secondary | ICD-10-CM

## 2021-11-26 DIAGNOSIS — R002 Palpitations: Secondary | ICD-10-CM | POA: Diagnosis not present

## 2021-11-26 DIAGNOSIS — I10 Essential (primary) hypertension: Secondary | ICD-10-CM | POA: Diagnosis not present

## 2021-11-26 DIAGNOSIS — E782 Mixed hyperlipidemia: Secondary | ICD-10-CM

## 2021-11-26 NOTE — Patient Instructions (Signed)
Medication Instructions:  ?The current medical regimen is effective;  continue present plan and medications as directed. Please refer to the Current Medication list given to you today. ? ?*If you need a refill on your cardiac medications before your next appointment, please call your pharmacy* ? ?Lab Work: ?FASTING LIPID IN 5 MONTHS (AUG 2023) ?If you have labs (blood work) drawn today and your tests are completely normal, you will receive your results only by:  [place] (if you have MyChart) OR A paper copy in the mail.  If you have any lab test that is abnormal or we need to change your treatment, we will call you to review the results. You may go to any [facility] that is convenient for you however, we do have a lab in our office that is able to assist you. You DO NOT need an appointment for our lab. The lab is open 8:00am and closes at 4:00pm. Lunch 12:45 - 1:45pm. ? ?Special Instructions ?PLEASE READ AND FOLLOW INCREASED FIBER DIET-ATTACHED ? ?PLEASE INCREASE PHYSICAL ACTIVITY AS TOLERATED GOAL IS 150 MINUTES OF MODERATE PHYSICAL  ? ?Follow-Up: ?Your next appointment:  6 month(s) In Person with Pixie Casino, MD   ? ?Please call our office 2 months in advance to schedule this appointment 1 ? ?At [facility], you and your health needs are our priority.  As part of our continuing mission to provide you with exceptional heart care, we have created designated Provider Care Teams.  These Care Teams include your primary Cardiologist (physician) and Advanced Practice Providers (APPs -  Physician Assistants and Nurse Practitioners) who all work together to provide you with the care you need, when you need it. ? ?We recommend signing up for the patient portal called "MyChart".  Sign up information is provided on this After Visit Summary.  MyChart is used to connect with patients for Virtual Visits (Telemedicine).  Patients are able to view lab/test results, encounter notes, upcoming appointments, etc.   Non-urgent messages can be sent to your provider as well.   ?To learn more about what you can do with MyChart, go to [email or web address].   ? ? ? ?High-Fiber Eating Plan ?Fiber, also called dietary fiber, is a type of carbohydrate. It is found foods such as fruits, vegetables, whole grains, and beans. A high-fiber diet can have many health benefits. Your health care provider may recommend a high-fiber diet to help: ?Prevent constipation. Fiber can make your bowel movements more regular. ?Lower your cholesterol. ?Relieve the following conditions: ?Inflammation of veins in the anus (hemorrhoids). ?Inflammation of specific areas of the digestive tract (uncomplicated diverticulosis). ?A problem of the large intestine, also called the colon, that sometimes causes pain and diarrhea (irritable bowel syndrome, or IBS). ?Prevent overeating as part of a weight-loss plan. ?Prevent heart disease, type 2 diabetes, and certain cancers. ?What are tips for following this plan? ?Reading food labels ? ?Check the nutrition facts label on food products for the amount of dietary fiber. Choose foods that have 5 grams of fiber or more per serving. ?The goals for recommended daily fiber intake include: ?Men (age 35 or younger): 34-38 g. ?Men (over age 92): 28-34 g. ?Women (age 100 or younger): 25-28 g. ?Women (over age 59): 22-25 g. ? ?Shopping ?Choose whole fruits and vegetables instead of processed forms, such as apple juice or applesauce. ?Choose a wide variety of high-fiber foods such as avocados, lentils, oats, and kidney beans. ?Read the nutrition facts label of the foods you choose. Be aware  of foods with added fiber. These foods often have high sugar and sodium amounts per serving. ?Cooking ?Use whole-grain flour for baking and cooking. ?Cook with brown rice instead of white rice. ?Meal planning ?Start the day with a breakfast that is high in fiber, such as a cereal that contains 5 g of fiber or more per serving. ?Eat breads and  cereals that are made with whole-grain flour instead of refined flour or white flour. ?Eat brown rice, bulgur wheat, or millet instead of white rice. ?Use beans in place of meat in soups, salads, and pasta dishes. ?Be sure that half of the grains you eat each day are whole grains. ?General information ?You can get the recommended daily intake of dietary fiber by: ?Eating a variety of fruits, vegetables, grains, nuts, and beans. ?Taking a fiber supplement if you are not able to take in enough fiber in your diet. It is better to get fiber through food than from a supplement. ?Gradually increase how much fiber you consume. If you increase your intake of dietary fiber too quickly, you may have bloating, cramping, or gas. ?Drink plenty of water to help you digest fiber. ?Choose high-fiber snacks, such as berries, raw vegetables, nuts, and popcorn. ?What foods should I eat? ?Fruits ?Berries. Pears. Apples. Oranges. Avocado. Prunes and raisins. Dried figs. ?Vegetables ?Sweet potatoes. Spinach. Kale. Artichokes. Cabbage. Broccoli. Cauliflower. Green peas. Carrots. Squash. ?Grains ?Whole-grain breads. Multigrain cereal. Oats and oatmeal. Brown rice. Barley. Bulgur wheat. [place]. Quinoa. Bran muffins. Popcorn. Rye wafer crackers. ?Meats and other proteins ?Navy beans, kidney beans, and pinto beans. Soybeans. Split peas. Lentils. Nuts and seeds. ?Dairy ?Fiber-fortified yogurt. ?Beverages ?Fiber-fortified soy milk. Fiber-fortified orange juice. ?Other foods ?Fiber bars. ?The items listed above may not be a complete list of recommended foods and beverages. Contact a dietitian for more information. ?What foods should I avoid? ?Fruits ?Fruit juice. Cooked, strained fruit. ?Vegetables ?Fried potatoes. Canned vegetables. Well-cooked vegetables. ?Grains ?White bread. Pasta made with refined flour. White rice. ?Meats and other proteins ?Fatty cuts of meat. Fried chicken or fried fish. ?Dairy ?Milk. Yogurt. Cream cheese. Sour  cream. ?Fats and oils ?Butters. ?Beverages ?Soft drinks. ?Other foods ?Cakes and pastries. ?The items listed above may not be a complete list of foods and beverages to avoid. Talk with your dietitian about what choices are best for you. ?Summary ?Fiber is a type of carbohydrate. It is found in foods such as fruits, vegetables, whole grains, and beans. ?A high-fiber diet has many benefits. It can help to prevent constipation, lower blood cholesterol, aid weight loss, and reduce your risk of heart disease, diabetes, and certain cancers. ?Increase your intake of fiber gradually. Increasing fiber too quickly may cause cramping, bloating, and gas. Drink plenty of water while you increase the amount of fiber you consume. ?The best sources of fiber include whole fruits and vegetables, whole grains, nuts, seeds, and beans. ?This information is not intended to replace advice given to you by your health care provider. Make sure you discuss any questions you have with your health care provider. ?Document Revised: 01/06/2020 Document Reviewed: 01/06/2020 ?Elsevier Patient Education ? 2022 [place]. ? ? ?

## 2021-11-27 DIAGNOSIS — Z85828 Personal history of other malignant neoplasm of skin: Secondary | ICD-10-CM | POA: Diagnosis not present

## 2021-11-27 DIAGNOSIS — L570 Actinic keratosis: Secondary | ICD-10-CM | POA: Diagnosis not present

## 2021-12-13 DIAGNOSIS — M62830 Muscle spasm of back: Secondary | ICD-10-CM | POA: Diagnosis not present

## 2021-12-13 DIAGNOSIS — M9901 Segmental and somatic dysfunction of cervical region: Secondary | ICD-10-CM | POA: Diagnosis not present

## 2021-12-13 DIAGNOSIS — M9903 Segmental and somatic dysfunction of lumbar region: Secondary | ICD-10-CM | POA: Diagnosis not present

## 2021-12-13 DIAGNOSIS — M9902 Segmental and somatic dysfunction of thoracic region: Secondary | ICD-10-CM | POA: Diagnosis not present

## 2021-12-18 ENCOUNTER — Other Ambulatory Visit: Payer: Self-pay | Admitting: Family Medicine

## 2021-12-27 ENCOUNTER — Other Ambulatory Visit: Payer: Self-pay | Admitting: Internal Medicine

## 2022-01-15 ENCOUNTER — Other Ambulatory Visit: Payer: Self-pay | Admitting: Family Medicine

## 2022-01-15 DIAGNOSIS — M9901 Segmental and somatic dysfunction of cervical region: Secondary | ICD-10-CM | POA: Diagnosis not present

## 2022-01-15 DIAGNOSIS — M62830 Muscle spasm of back: Secondary | ICD-10-CM | POA: Diagnosis not present

## 2022-01-15 DIAGNOSIS — M9902 Segmental and somatic dysfunction of thoracic region: Secondary | ICD-10-CM | POA: Diagnosis not present

## 2022-01-15 DIAGNOSIS — M9903 Segmental and somatic dysfunction of lumbar region: Secondary | ICD-10-CM | POA: Diagnosis not present

## 2022-01-16 ENCOUNTER — Encounter: Payer: Self-pay | Admitting: Family Medicine

## 2022-01-16 DIAGNOSIS — M62830 Muscle spasm of back: Secondary | ICD-10-CM | POA: Diagnosis not present

## 2022-01-16 DIAGNOSIS — M9901 Segmental and somatic dysfunction of cervical region: Secondary | ICD-10-CM | POA: Diagnosis not present

## 2022-01-16 DIAGNOSIS — M9902 Segmental and somatic dysfunction of thoracic region: Secondary | ICD-10-CM | POA: Diagnosis not present

## 2022-01-16 DIAGNOSIS — M9903 Segmental and somatic dysfunction of lumbar region: Secondary | ICD-10-CM | POA: Diagnosis not present

## 2022-01-16 NOTE — Telephone Encounter (Signed)
Schedule her for OV. ?

## 2022-01-22 ENCOUNTER — Encounter: Payer: Self-pay | Admitting: Family Medicine

## 2022-01-22 ENCOUNTER — Ambulatory Visit (INDEPENDENT_AMBULATORY_CARE_PROVIDER_SITE_OTHER): Payer: Medicare Other | Admitting: Family Medicine

## 2022-01-22 VITALS — BP 118/78 | HR 77 | Temp 98.1°F | Ht 66.0 in | Wt 180.6 lb

## 2022-01-22 DIAGNOSIS — M19042 Primary osteoarthritis, left hand: Secondary | ICD-10-CM

## 2022-01-22 DIAGNOSIS — M2550 Pain in unspecified joint: Secondary | ICD-10-CM | POA: Diagnosis not present

## 2022-01-22 DIAGNOSIS — M19041 Primary osteoarthritis, right hand: Secondary | ICD-10-CM

## 2022-01-22 MED ORDER — CELECOXIB 100 MG PO CAPS: 100.0000 mg | ORAL_CAPSULE | Freq: Two times a day (BID) | ORAL | 0 refills | Status: DC | PRN

## 2022-01-22 NOTE — Progress Notes (Signed)
? ?Subjective: ?CC: Osteoarthritis ?PCP: Janora Norlander, DO ?FBP:[name] is a 71 y.o. female presenting to clinic today for: ? ?1.  Osteoarthritis ?Patient reports that she was seen by Dr. Amil Amen, who determined that her degenerative changes in the hands were not related to rheumatoid arthritis.  She notes ongoing pain and debility in that right hand.  She is right-hand dominant.  Has a very much difficulty with grip strength and sometimes even finds it difficult to drive secondary to the excruciating pain.  Her ring finger knuckle has gotten so large that she can no longer wear her wedding ring on this side.  Sometimes the arthritis seems to be migratory and affects different joints at different times.  Right thumb has been a problem in the past.  She had a corticosteroid injection to the shoulder before but did not find that to be helpful.  Currently utilizing ibuprofen and Tylenol over-the-counter as needed but really does not like to depend on those types of medications.  She has history of gastric upset with naproxen since she avoids that medicine.  Does not report any GI bleeding.  Has tried tumeric over-the-counter and that was helpful as an anti-inflammatory but unfortunately she developed a rash that so she discontinued. ? ? ?ROS: Per HPI ? ?Allergies  ?Allergen Reactions  ? Bee Venom Swelling  ? Crestor [Rosuvastatin] Other (See Comments)  ?  Myalgias  ? Erythromycin Itching, Rash and Other (See Comments)  ? Rosuvastatin Calcium Other (See Comments)  ?  Myalgias  ? Pravastatin Other (See Comments)  ?  Pain in joints.  ? Zetia [Ezetimibe]   ? Aleve [Naproxen Sodium]   ?  Gastritis  ? Tetracyclines & Related Other (See Comments)  ?  unknown  ? Vytorin [Ezetimibe-Simvastatin] Other (See Comments)  ?  unknown  ? ?Past Medical History:  ?Diagnosis Date  ? Allergy   ? Asthma   ? Back pain   ? Cancer [facility])   ? skin cancer  ? Depression   ? Hyperlipidemia   ? ? ?Current Outpatient Medications:  ?   albuterol (VENTOLIN HFA) 108 (90 Base) MCG/ACT inhaler, Inhale 2 puffs into the lungs every 6 (six) hours as needed for wheezing or shortness of breath., Disp: 1 each, Rfl: 0 ?  allopurinol (ZYLOPRIM) 300 MG tablet, Take 1 tablet (300 mg total) by mouth every evening., Disp: 90 tablet, Rfl: 3 ?  Ascorbic Acid (VITAMIN C PO), Take by mouth., Disp: , Rfl:  ?  atenolol (TENORMIN) 50 MG tablet, TAKE 1 TABLET BY MOUTH  DAILY, Disp: 90 tablet, Rfl: 1 ?  BIOTIN PO, Take by mouth., Disp: , Rfl:  ?  budesonide (PULMICORT) 180 MCG/ACT inhaler, Inhale 2 puffs into the lungs 2 (two) times daily., Disp: , Rfl:  ?  cetirizine (ZYRTEC) 10 MG tablet, Take 1 tablet by mouth daily., Disp: , Rfl:  ?  cholecalciferol (VITAMIN D3) 25 MCG (1000 UT) tablet, Take 1,000 Units by mouth daily., Disp: , Rfl:  ?  esomeprazole (NEXIUM) 20 MG capsule, Take 20 mg by mouth daily at 12 noon., Disp: , Rfl:  ?  glucose blood (CONTOUR NEXT TEST) test strip, Test BS daily and as needed Dx R73.09, Disp: 100 each, Rfl: 3 ?  Lancets 30G MISC, Test BS daily and as needed Dx R73.09, Disp: 100 each, Rfl: 3 ?  MAGNESIUM PO, Take by mouth., Disp: , Rfl:  ?  montelukast (SINGULAIR) 10 MG tablet, TAKE 1 TABLET BY MOUTH IN  THE MORNING, Disp: 100 tablet, Rfl: 0 ?  Multiple Vitamin (MULTIVITAMIN) capsule, Take 1 capsule by mouth daily. , Disp: , Rfl:  ?  sertraline (ZOLOFT) 50 MG tablet, Take 1 tablet (50 mg total) by mouth daily., Disp: 30 tablet, Rfl: 5 ?Social History  ? ?Socioeconomic History  ? Marital status: Married  ?  Spouse name: Not on file  ? Number of children: Not on file  ? Years of education: Not on file  ? Highest education level: Not on file  ?Occupational History  ? Not on file  ?Tobacco Use  ? Smoking status: Never  ? Smokeless tobacco: Never  ?Vaping Use  ? Vaping Use: Never used  ?Substance and Sexual Activity  ? Alcohol use: No  ? Drug use: No  ? Sexual activity: Not on file  ?Other Topics Concern  ? Not on file  ?Social History Narrative  ?  Not on file  ? ?Social Determinants of Health  ? ?Financial Resource Strain: Low Risk   ? Difficulty of Paying Living Expenses: Not very hard  ?Food Insecurity: No Food Insecurity  ? Worried About Charity fundraiser in the Last Year: Never true  ? Ran Out of Food in the Last Year: Never true  ?Transportation Needs: No Transportation Needs  ? Lack of Transportation (Medical): No  ? Lack of Transportation (Non-Medical): No  ?Physical Activity: Insufficiently Active  ? Days of Exercise per Week: 2 days  ? Minutes of Exercise per Session: 30 min  ?Stress: Not on file  ?Social Connections: Socially Integrated  ? Frequency of Communication with Friends and Family: More than three times a week  ? Frequency of Social Gatherings with Friends and Family: More than three times a week  ? Attends Religious Services: More than 4 times per year  ? Active Member of Clubs or Organizations: Yes  ? Attends Archivist Meetings: More than 4 times per year  ? Marital Status: Married  ?Intimate Partner Violence: Not on file  ? ?Family History  ?Problem Relation Age of Onset  ? Heart disease Mother   ? Kidney disease Mother   ? Breast cancer Mother   ? Coronary artery disease Mother   ?     CABG at age 68  ? Dementia Father   ? Stroke Father   ? Stroke Maternal Grandmother   ? Cancer Maternal Grandfather   ? Cancer Paternal Grandmother   ? Kidney disease Paternal Grandfather   ? ? ?Objective: ?Office vital signs reviewed. ?BP 118/78   Pulse 77   Temp 98.1 ?F (36.7 ?C)   Ht '5\' 6"'$  (1.676 m)   Wt 180 lb 9.6 oz (81.9 kg)   SpO2 94%   BMI 29.15 kg/m?  ? ?Physical Examination:  ?General: Awake, alert, well nourished, No acute distress ?MSK: Degenerative changes noted to bilateral hands, right worse than left.  She has some deformity of the right pointer finger at the DIP as well as the right pinky finger at the DIP.  There is no warmth or swelling of the joint but they certainly are enlarged.  She has decreased active range of  motion in handgrip on this side. ? ?Assessment/ Plan: ?71 y.o. female  ? ?Polyarthralgia - Plan: celecoxib (CELEBREX) 100 MG capsule ? ?Primary osteoarthritis of both hands - Plan: celecoxib (CELEBREX) 100 MG capsule ? ?Going to trial her on Celebrex which has a lower risk of GI bleeding, particularly given her concomitant use of SSRI.  We discussed  cardiovascular risks associated with these classes of medications.  I advised her to avoid oral NSAIDs OTC totally but she may use Tylenol if needed.  Continue PPI for gastric protection.  She will follow-up with me if medication is helpful and we will send to her mail order pharmacy.  If not helpful, I have advised her to follow-up in office and we can consider something like tramadol or Norco. ? ?No orders of the defined types were placed in this encounter. ? ?No orders of the defined types were placed in this encounter. ? ? ? ?Janora Norlander, DO ?[place] ?[phone] ? ?

## 2022-02-12 ENCOUNTER — Telehealth: Payer: Self-pay | Admitting: Family Medicine

## 2022-02-12 ENCOUNTER — Ambulatory Visit (INDEPENDENT_AMBULATORY_CARE_PROVIDER_SITE_OTHER): Payer: Medicare Other | Admitting: Family Medicine

## 2022-02-12 DIAGNOSIS — B9689 Other specified bacterial agents as the cause of diseases classified elsewhere: Secondary | ICD-10-CM | POA: Diagnosis not present

## 2022-02-12 DIAGNOSIS — J208 Acute bronchitis due to other specified organisms: Secondary | ICD-10-CM

## 2022-02-12 MED ORDER — PREDNISONE 20 MG PO TABS: ORAL_TABLET | ORAL | 0 refills | Status: DC

## 2022-02-12 MED ORDER — ALBUTEROL SULFATE HFA 108 (90 BASE) MCG/ACT IN AERS: 2.0000 | INHALATION_SPRAY | Freq: Four times a day (QID) | RESPIRATORY_TRACT | 0 refills | Status: AC | PRN

## 2022-02-12 MED ORDER — BENZONATATE 100 MG PO CAPS: 100.0000 mg | ORAL_CAPSULE | Freq: Three times a day (TID) | ORAL | 0 refills | Status: DC | PRN

## 2022-02-12 MED ORDER — CEFDINIR 300 MG PO CAPS: 300.0000 mg | ORAL_CAPSULE | Freq: Two times a day (BID) | ORAL | 0 refills | Status: DC

## 2022-02-12 NOTE — Telephone Encounter (Signed)
  Incoming Patient Call  02/12/2022  What symptoms do you have? Chest congestion,. Got choked on a piece  of lettuce and has coughed a lot trying to get that up and throat is irritated. Patient is at the beach and daughter is in labor and needs to leave to go to the hospital.  How long have you been sick? 3 days  Have you been seen for this problem? NO  If your provider decides to give you a prescription, which pharmacy would you like for it to be sent to? [facility] P# [phone]   Patient informed that this information will be sent to the clinical staff for review and that they should receive a follow up call.

## 2022-02-12 NOTE — Telephone Encounter (Signed)
Pt has been scheduled.  °

## 2022-02-12 NOTE — Telephone Encounter (Signed)
Patient took COVID test and it was negative. Dr. Darnell Level asked for patient to call and give results. [facility] is [facility] the [facility] now waiting on medications to be called in.

## 2022-02-12 NOTE — Progress Notes (Signed)
Telephone visit  Subjective: CC: Bronchitis PCP: Janora Norlander, DO [id number] J Mirabal is a 71 y.o. female calls for telephone consult today. Patient provides verbal consent for consult held via phone.  Due to COVID-19 pandemic this visit was conducted virtually. This visit type was conducted due to national recommendations for restrictions regarding the COVID-19 Pandemic (e.g. social distancing, sheltering in place) in an effort to limit this patient's exposure and mitigate transmission in our community. All issues noted in this document were discussed and addressed.  A physical exam was not performed with this format.   Location of patient: [place] Location of provider: [facility] Others present for call: none  1. URI Patient reports productive cough.  She is at the beach right now. No brown or bloody sputum.  Sputum is yellow. No fevers.  She reports wheezing and shortness of breath and has been using her inhaler.  She feels light headed.  No nausea, vomiting or diarrhea.  She had a negative COVID test at home.  Had similar flareup in December which improved with antibiotics and prednisone.  Needs a renewal on her inhaler as well as that she has been utilizing this more often  ROS: Per HPI  Allergies  Allergen Reactions   Bee Venom Swelling   Crestor [Rosuvastatin] Other (See Comments)    Myalgias   Erythromycin Itching, Rash and Other (See Comments)   Rosuvastatin Calcium Other (See Comments)    Myalgias   Pravastatin Other (See Comments)    Pain in joints.   Zetia [Ezetimibe]    Aleve [Naproxen Sodium]     Gastritis   Tetracyclines & Related Other (See Comments)    unknown   Vytorin [Ezetimibe-Simvastatin] Other (See Comments)    unknown   Past Medical History:  Diagnosis Date   Allergy    Asthma    Back pain    Cancer ([place])    skin cancer   Depression    Hyperlipidemia     Current Outpatient Medications:    albuterol (VENTOLIN HFA) 108 (90 Base) MCG/ACT inhaler,  Inhale 2 puffs into the lungs every 6 (six) hours as needed for wheezing or shortness of breath., Disp: 1 each, Rfl: 0   allopurinol (ZYLOPRIM) 300 MG tablet, Take 1 tablet (300 mg total) by mouth every evening., Disp: 90 tablet, Rfl: 3   Ascorbic Acid (VITAMIN C PO), Take by mouth., Disp: , Rfl:    atenolol (TENORMIN) 50 MG tablet, TAKE 1 TABLET BY MOUTH  DAILY, Disp: 90 tablet, Rfl: 1   BIOTIN PO, Take by mouth., Disp: , Rfl:    budesonide (PULMICORT) 180 MCG/ACT inhaler, Inhale 2 puffs into the lungs 2 (two) times daily., Disp: , Rfl:    celecoxib (CELEBREX) 100 MG capsule, Take 1 capsule (100 mg total) by mouth 2 (two) times daily as needed for moderate pain., Disp: 60 capsule, Rfl: 0   cetirizine (ZYRTEC) 10 MG tablet, Take 1 tablet by mouth daily., Disp: , Rfl:    cholecalciferol (VITAMIN D3) 25 MCG (1000 UT) tablet, Take 1,000 Units by mouth daily., Disp: , Rfl:    esomeprazole (NEXIUM) 20 MG capsule, Take 20 mg by mouth daily at 12 noon., Disp: , Rfl:    glucose blood (CONTOUR NEXT TEST) test strip, Test BS daily and as needed Dx R73.09, Disp: 100 each, Rfl: 3   Lancets 30G MISC, Test BS daily and as needed Dx R73.09, Disp: 100 each, Rfl: 3   montelukast (SINGULAIR) 10 MG tablet, TAKE 1  TABLET BY MOUTH IN  THE MORNING, Disp: 100 tablet, Rfl: 0   Multiple Vitamin (MULTIVITAMIN) capsule, Take 1 capsule by mouth daily. , Disp: , Rfl:    sertraline (ZOLOFT) 50 MG tablet, Take 1 tablet (50 mg total) by mouth daily., Disp: 30 tablet, Rfl: 5  Assessment/ Plan: 71 y.o. female   Acute bacterial bronchitis - Plan: albuterol (VENTOLIN HFA) 108 (90 Base) MCG/ACT inhaler, predniSONE (DELTASONE) 20 MG tablet, benzonatate (TESSALON PERLES) 100 MG capsule, cefdinir (OMNICEF) 300 MG capsule  Medications have been sent to pharmacy.  Okay instructions reviewed.  Reasons for reevaluation discussed.  Follow-up as needed  Start time: 9:55am End time: 10:00a  Total time spent on patient care (including  telephone call/ virtual visit): 5 minutes  [place], [place] [phone]

## 2022-02-14 ENCOUNTER — Telehealth: Payer: Self-pay | Admitting: Family Medicine

## 2022-02-14 NOTE — Telephone Encounter (Signed)
In person visit scheduled.

## 2022-02-15 ENCOUNTER — Ambulatory Visit (INDEPENDENT_AMBULATORY_CARE_PROVIDER_SITE_OTHER): Payer: Medicare Other | Admitting: Family Medicine

## 2022-02-15 ENCOUNTER — Ambulatory Visit: Payer: Medicare Other | Admitting: Family Medicine

## 2022-02-15 ENCOUNTER — Ambulatory Visit (INDEPENDENT_AMBULATORY_CARE_PROVIDER_SITE_OTHER): Payer: Medicare Other

## 2022-02-15 ENCOUNTER — Encounter: Payer: Self-pay | Admitting: Family Medicine

## 2022-02-15 VITALS — BP 126/74 | HR 74 | Temp 98.0°F | Ht 66.0 in | Wt 180.0 lb

## 2022-02-15 DIAGNOSIS — R0602 Shortness of breath: Secondary | ICD-10-CM

## 2022-02-15 DIAGNOSIS — R059 Cough, unspecified: Secondary | ICD-10-CM | POA: Diagnosis not present

## 2022-02-15 DIAGNOSIS — B9689 Other specified bacterial agents as the cause of diseases classified elsewhere: Secondary | ICD-10-CM

## 2022-02-15 DIAGNOSIS — J208 Acute bronchitis due to other specified organisms: Secondary | ICD-10-CM

## 2022-02-15 DIAGNOSIS — J4541 Moderate persistent asthma with (acute) exacerbation: Secondary | ICD-10-CM

## 2022-02-15 DIAGNOSIS — R6883 Chills (without fever): Secondary | ICD-10-CM | POA: Diagnosis not present

## 2022-02-15 DIAGNOSIS — R051 Acute cough: Secondary | ICD-10-CM

## 2022-02-15 NOTE — Progress Notes (Signed)
Established Patient Office Visit  Subjective   Patient ID: [name], female    DOB: Apr 07, 1951  Age: 71 y.o. MRN: [id number]  Chief Complaint  Patient presents with   Cough    Cough  [name] reports cough, congestion, shortness of breath, and wheezing for the last 3 days. Had telephone visit then with her PCP and was started on omnicef and prednisone. She did have a fever 2 days ago with chills, but not since. She reports that symptoms have been unchanged. She has been using her albuterol inhaler. She has not been using pulmicort. She has had a negative Covid test. Denies chest pain, nausea, vomiting, or diarrhea.   Past Medical History:  Diagnosis Date   Allergy    Asthma    Back pain    Cancer ([place])    skin cancer   Depression    Hyperlipidemia       Review of Systems  Respiratory:  Positive for cough.   As per HPI.    Objective:     BP 126/74   Pulse 74   Temp 98 F (36.7 C) (Temporal)   Ht '5\' 6"'$  (1.676 m)   Wt 180 lb (81.6 kg)   SpO2 98%   BMI 29.05 kg/m    Physical Exam Vitals and nursing note reviewed.  Constitutional:      General: She is not in acute distress.    Appearance: She is not toxic-appearing or diaphoretic.  HENT:     Head: Normocephalic and atraumatic.     Right Ear: Tympanic membrane, ear canal and external ear normal.     Left Ear: Tympanic membrane, ear canal and external ear normal.     Nose: Congestion present.     Mouth/Throat:     Mouth: Mucous membranes are moist.     Pharynx: Oropharynx is clear. No oropharyngeal exudate or posterior oropharyngeal erythema.  Eyes:     General:        Right eye: No discharge.        Left eye: No discharge.     Conjunctiva/sclera: Conjunctivae normal.  Cardiovascular:     Rate and Rhythm: Normal rate and regular rhythm.     Heart sounds: Normal heart sounds. No murmur heard. Pulmonary:     Effort: Pulmonary effort is normal. No respiratory distress.     Breath sounds: Normal breath  sounds. No wheezing, rhonchi or rales.  Chest:     Chest wall: No tenderness.  Skin:    General: Skin is warm and dry.  Neurological:     General: No focal deficit present.     Mental Status: She is alert and oriented to person, place, and time.  Psychiatric:        Mood and Affect: Mood normal.        Behavior: Behavior normal.     No results found for any visits on 02/15/22.    The 10-year ASCVD risk score (Arnett DK, et al., 2019) is: 13.1%    Assessment & Plan:   [name] was seen today for cough.  Diagnoses and all orders for this visit:  SOB (shortness of breath) Acute bacterial bronchitis Moderate persistent asthma with acute exacerbation CXR negative today. Lungs clear on exam with normal work of breathing. Reassured patient today. Continue omnicef and prednisone. Restart pulmicort daily. Use albuterol prn. Mucinex BID for cough/congestion. Rest, hydration. Return to office for new or worsening symptoms, or if symptoms persist.  -  DG Chest 2 View; Future  The patient indicates understanding of these issues and agrees with the plan.   Gwenlyn Perking, FNP

## 2022-02-18 ENCOUNTER — Other Ambulatory Visit: Payer: Self-pay | Admitting: Family Medicine

## 2022-02-18 DIAGNOSIS — M19041 Primary osteoarthritis, right hand: Secondary | ICD-10-CM

## 2022-02-18 DIAGNOSIS — M2550 Pain in unspecified joint: Secondary | ICD-10-CM

## 2022-02-24 ENCOUNTER — Encounter: Payer: Self-pay | Admitting: Family Medicine

## 2022-03-14 DIAGNOSIS — H35371 Puckering of macula, right eye: Secondary | ICD-10-CM | POA: Diagnosis not present

## 2022-03-14 DIAGNOSIS — H40023 Open angle with borderline findings, high risk, bilateral: Secondary | ICD-10-CM | POA: Diagnosis not present

## 2022-03-14 DIAGNOSIS — Z961 Presence of intraocular lens: Secondary | ICD-10-CM | POA: Diagnosis not present

## 2022-03-20 DIAGNOSIS — M9902 Segmental and somatic dysfunction of thoracic region: Secondary | ICD-10-CM | POA: Diagnosis not present

## 2022-03-20 DIAGNOSIS — M9901 Segmental and somatic dysfunction of cervical region: Secondary | ICD-10-CM | POA: Diagnosis not present

## 2022-03-20 DIAGNOSIS — M9903 Segmental and somatic dysfunction of lumbar region: Secondary | ICD-10-CM | POA: Diagnosis not present

## 2022-03-20 DIAGNOSIS — M62830 Muscle spasm of back: Secondary | ICD-10-CM | POA: Diagnosis not present

## 2022-03-23 ENCOUNTER — Other Ambulatory Visit: Payer: Self-pay | Admitting: Family Medicine

## 2022-03-25 NOTE — Telephone Encounter (Signed)
LOV:02/15/2022 No Future visits scheduled

## 2022-03-30 ENCOUNTER — Other Ambulatory Visit: Payer: Self-pay | Admitting: Internal Medicine

## 2022-04-20 ENCOUNTER — Other Ambulatory Visit: Payer: Self-pay | Admitting: Family Medicine

## 2022-05-27 DIAGNOSIS — M62830 Muscle spasm of back: Secondary | ICD-10-CM | POA: Diagnosis not present

## 2022-05-27 DIAGNOSIS — M9903 Segmental and somatic dysfunction of lumbar region: Secondary | ICD-10-CM | POA: Diagnosis not present

## 2022-05-27 DIAGNOSIS — M9902 Segmental and somatic dysfunction of thoracic region: Secondary | ICD-10-CM | POA: Diagnosis not present

## 2022-05-27 DIAGNOSIS — M9901 Segmental and somatic dysfunction of cervical region: Secondary | ICD-10-CM | POA: Diagnosis not present

## 2022-06-04 DIAGNOSIS — L0100 Impetigo, unspecified: Secondary | ICD-10-CM | POA: Diagnosis not present

## 2022-06-04 DIAGNOSIS — L570 Actinic keratosis: Secondary | ICD-10-CM | POA: Diagnosis not present

## 2022-06-05 DIAGNOSIS — Z1231 Encounter for screening mammogram for malignant neoplasm of breast: Secondary | ICD-10-CM | POA: Diagnosis not present

## 2022-06-25 ENCOUNTER — Encounter: Payer: Self-pay | Admitting: Family Medicine

## 2022-06-25 ENCOUNTER — Ambulatory Visit (INDEPENDENT_AMBULATORY_CARE_PROVIDER_SITE_OTHER): Payer: Medicare Other | Admitting: Family Medicine

## 2022-06-25 VITALS — Temp 98.2°F | Ht 66.0 in | Wt 182.8 lb

## 2022-06-25 DIAGNOSIS — R42 Dizziness and giddiness: Secondary | ICD-10-CM

## 2022-06-25 MED ORDER — MECLIZINE HCL 25 MG PO TABS: 25.0000 mg | ORAL_TABLET | Freq: Three times a day (TID) | ORAL | 0 refills | Status: DC | PRN

## 2022-06-25 NOTE — Patient Instructions (Signed)
How to Perform the Epley Maneuver The Epley maneuver is an exercise that relieves symptoms of vertigo. Vertigo is the feeling that you or your surroundings are moving when they are not. When you feel vertigo, you may feel like the room is spinning and may have trouble walking. The Epley maneuver is used for a type of vertigo caused by a calcium deposit in a part of the inner ear. The maneuver involves changing head positions to help the deposit move out of the area. You can do this maneuver at home whenever you have symptoms of vertigo. You can repeat it in 24 hours if your vertigo has not gone away. Even though the Epley maneuver may relieve your vertigo for a few weeks, it is possible that your symptoms will return. This maneuver relieves vertigo, but it does not relieve dizziness. What are the risks? If it is done correctly, the Epley maneuver is considered safe. Sometimes it can lead to dizziness or nausea that goes away after a short time. If you develop other symptoms--such as changes in vision, weakness, or numbness--stop doing the maneuver and call your health care provider. Supplies needed: A bed or table. A pillow. How to do the Epley maneuver     Sit on the edge of a bed or table with your back straight and your legs extended or hanging over the edge of the bed or table. Turn your head halfway toward the affected ear or side as told by your health care provider. Lie backward quickly with your head turned until you are lying flat on your back. Your head should dangle (head-hanging position). You may want to position a pillow under your shoulders. Hold this position for at least 30 seconds. If you feel dizzy or have symptoms of vertigo, continue to hold the position until the symptoms stop. Turn your head to the opposite direction until your unaffected ear is facing down. Your head should continue to dangle. Hold this position for at least 30 seconds. If you feel dizzy or have symptoms of  vertigo, continue to hold the position until the symptoms stop. Turn your whole body to the same side as your head so that you are positioned on your side. Your head will now be nearly facedown and no longer needs to dangle. Hold for at least 30 seconds. If you feel dizzy or have symptoms of vertigo, continue to hold the position until the symptoms stop. Sit back up. You can repeat the maneuver in 24 hours if your vertigo does not go away. Follow these instructions at home: For 24 hours after doing the Epley maneuver: Keep your head in an upright position. When lying down to sleep or rest, keep your head raised (elevated) with two or more pillows. Avoid excessive neck movements. Activity Do not drive or use machinery if you feel dizzy. After doing the Epley maneuver, return to your normal activities as told by your health care provider. Ask your health care provider what activities are safe for you. General instructions Drink enough fluid to keep your urine pale yellow. Do not drink alcohol. Take over-the-counter and prescription medicines only as told by your health care provider. Keep all follow-up visits. This is important. Preventing vertigo symptoms Ask your health care provider if there is anything you should do at home to prevent vertigo. He or she may recommend that you: Keep your head elevated with two or more pillows while you sleep. Do not sleep on the side of your affected ear. Get   up slowly from bed. Avoid sudden movements during the day. Avoid extreme head positions or movement, such as looking up or bending over. Contact a health care provider if: Your vertigo gets worse. You have other symptoms, including: Nausea. Vomiting. Headache. Get help right away if you: Have vision changes. Have a headache or neck pain that is severe or getting worse. Cannot stop vomiting. Have new numbness or weakness in any part of your body. These symptoms may represent a serious problem  that is an emergency. Do not wait to see if the symptoms will go away. Get medical help right away. Call your local emergency services (911 in the [place] Do not drive yourself to the hospital. Summary Vertigo is the feeling that you or your surroundings are moving when they are not. The Epley maneuver is an exercise that relieves symptoms of vertigo. If the Epley maneuver is done correctly, it is considered safe. This information is not intended to replace advice given to you by your health care provider. Make sure you discuss any questions you have with your health care provider. Document Revised: 08/02/2020 Document Reviewed: 08/02/2020 Elsevier Patient Education  2023 [employer]  

## 2022-06-25 NOTE — Progress Notes (Signed)
Subjective: Tanya PCP: Tanya Norlander, DO YFV:[id number] [name] is a 71 y.o. female presenting to clinic today for:  1. Vertigo Intermittent for months.  Started Thursday and got worse over the weekend. Today she woke up asymptomatic but as the day has gone she is starting become more symptomatic.  She describes room spinning sensation.  No LOC.  Denies any unilateral weakness, sensory changes, speech issues, blurred vision or double vision.  Denies any sinus symptoms.  Her family members have had similar.   ROS: Per HPI  Allergies  Allergen Reactions   Bee Venom Swelling   Crestor [Rosuvastatin] Other (See Comments)    Myalgias   Erythromycin Itching, Rash and Other (See Comments)   Rosuvastatin Calcium Other (See Comments)    Myalgias   Pravastatin Other (See Comments)    Pain in joints.   Zetia [Ezetimibe]    Aleve [Naproxen Sodium]     Gastritis   Tetracyclines & Related Other (See Comments)    unknown   Vytorin [Ezetimibe-Simvastatin] Other (See Comments)    unknown   Past Medical History:  Diagnosis Date   Allergy    Asthma    Back pain    Cancer ([place])    skin cancer   Depression    Hyperlipidemia     Current Outpatient Medications:    albuterol (VENTOLIN HFA) 108 (90 Base) MCG/ACT inhaler, Inhale 2 puffs into the lungs every 6 (six) hours as needed for wheezing or shortness of breath., Disp: 1 each, Rfl: 0   allopurinol (ZYLOPRIM) 300 MG tablet, TAKE 1 TABLET BY MOUTH IN  THE EVENING, Disp: 90 tablet, Rfl: 3   Ascorbic Acid (VITAMIN C PO), Take by mouth., Disp: , Rfl:    atenolol (TENORMIN) 50 MG tablet, TAKE 1 TABLET BY MOUTH DAILY, Disp: 100 tablet, Rfl: 2   benzonatate (TESSALON PERLES) 100 MG capsule, Take 1 capsule (100 mg total) by mouth 3 (three) times daily as needed., Disp: 20 capsule, Rfl: 0   BIOTIN PO, Take by mouth., Disp: , Rfl:    budesonide (PULMICORT) 180 MCG/ACT inhaler, Inhale 2 puffs into the lungs 2 (two) times daily., Disp: , Rfl:     cefdinir (OMNICEF) 300 MG capsule, Take 1 capsule (300 mg total) by mouth 2 (two) times daily. 1 po BID, Disp: 20 capsule, Rfl: 0   celecoxib (CELEBREX) 100 MG capsule, TAKE 1 CAPSULE (100 MG TOTAL) BY MOUTH 2 (TWO) TIMES DAILY AS NEEDED FOR MODERATE PAIN., Disp: 60 capsule, Rfl: 1   cetirizine (ZYRTEC) 10 MG tablet, Take 1 tablet by mouth daily., Disp: , Rfl:    cholecalciferol (VITAMIN D3) 25 MCG (1000 UT) tablet, Take 1,000 Units by mouth daily., Disp: , Rfl:    esomeprazole (NEXIUM) 20 MG capsule, Take 20 mg by mouth daily at 12 noon., Disp: , Rfl:    glucose blood (CONTOUR NEXT TEST) test strip, Test BS daily and as needed Dx R73.09, Disp: 100 each, Rfl: 3   Lancets 30G MISC, Test BS daily and as needed Dx R73.09, Disp: 100 each, Rfl: 3   montelukast (SINGULAIR) 10 MG tablet, TAKE 1 TABLET BY MOUTH IN THE  MORNING, Disp: 100 tablet, Rfl: 2   Multiple Vitamin (MULTIVITAMIN) capsule, Take 1 capsule by mouth daily. , Disp: , Rfl:    predniSONE (DELTASONE) 20 MG tablet, 2 po at same time daily for 5 days, Disp: 10 tablet, Rfl: 0   sertraline (ZOLOFT) 50 MG tablet, Take 1 tablet (50 mg total)  by mouth daily., Disp: 30 tablet, Rfl: 5 Social History   Socioeconomic History   Marital status: Married    Spouse name: Not on file   Number of children: Not on file   Years of education: Not on file   Highest education level: Not on file  Occupational History   Not on file  Tobacco Use   Smoking status: Never   Smokeless tobacco: Never  Vaping Use   Vaping Use: Never used  Substance and Sexual Activity   Alcohol use: No   Drug use: No   Sexual activity: Not on file  Other Topics Concern   Not on file  Social History Narrative   Not on file   Social Determinants of Health   Financial Resource Strain: Low Risk  (07/31/2021)   Overall Financial Resource Strain (CARDIA)    Difficulty of Paying Living Expenses: Not very hard  Food Insecurity: No Food Insecurity (07/31/2021)   Hunger  Vital Sign    Worried About Running Out of Food in the Last Year: Never true    Ran Out of Food in the Last Year: Never true  Transportation Needs: No Transportation Needs (07/31/2021)   PRAPARE - Hydrologist (Medical): No    Lack of Transportation (Non-Medical): No  Physical Activity: Insufficiently Active (07/31/2021)   Exercise Vital Sign    Days of Exercise per Week: 2 days    Minutes of Exercise per Session: 30 min  Stress: Not on file  Social Connections: Socially Integrated (07/31/2021)   Social Connection and Isolation Panel [NHANES]    Frequency of Communication with Friends and Family: More than three times a week    Frequency of Social Gatherings with Friends and Family: More than three times a week    Attends Religious Services: More than 4 times per year    Active Member of [employer] or Organizations: Yes    Attends Music therapist: More than 4 times per year    Marital Status: Married  Human resources officer Violence: Not on file   Family History  Problem Relation Age of Onset   Heart disease Mother    Kidney disease Mother    Breast cancer Mother    Coronary artery disease Mother        CABG at age 83   Dementia Father    Stroke Father    Stroke Maternal Grandmother    Cancer Maternal Grandfather    Cancer Paternal Grandmother    Kidney disease Paternal Grandfather     Objective: Office vital signs reviewed. Temp 98.2 F (36.8 C)   Ht '5\' 6"'$  (1.676 m)   Wt 182 lb 12.8 oz (82.9 kg)   SpO2 98%   BMI 29.50 kg/m   Physical Examination:  General: Awake, alert, nontoxic but appears worried HEENT: TMs intact bilaterally.  Light reflex slightly dulled.  No sinus drainage appreciated.  Sclera are white MSK: Requires some assistance for ambulation to steady her.  She otherwise ambulates independently.  Gait is normal Neuro: Cranial nerves II through XII grossly intact.  No focal neurologic deficits.  No nystagmus  appreciated.  Orthostatic Vitals for the past 48 hrs (Last 6 readings):  Orthostatic BP Orthostatic Pulse  06/25/22 1004 (!) 145/93 70  06/25/22 1010 143/88 69  06/25/22 1011 146/89 72   Assessment/ Plan: 71 y.o. female   Vertigo - Plan: Ambulatory referral to Physical Therapy, meclizine (ANTIVERT) 25 MG tablet  Certainly a vertiginous.  No focal  findings on exam except for need for slight assistance with ambulation, more so due to being actively vertiginous.  I have placed a stat referral to physical therapy for vestibular rehab as well as send in meclizine.  I offered Epley maneuver here in office but worried that this might make symptoms worse that she had difficulty rising from lying to seated position just for orthostatic vital signs today.  Not demonstrating any signs or symptoms concerning for intracranial processes but we did discuss signs and symptoms that would warrant immediate evaluation.  If symptoms or not improving or if they abruptly worsen, low threshold to obtain MRI of the brain and possible referral   No orders of the defined types were placed in this encounter.  No orders of the defined types were placed in this encounter.    Tanya Norlander, DO [place] [phone]

## 2022-07-10 ENCOUNTER — Encounter (INDEPENDENT_AMBULATORY_CARE_PROVIDER_SITE_OTHER): Payer: Self-pay | Admitting: *Deleted

## 2022-07-18 ENCOUNTER — Other Ambulatory Visit: Payer: Self-pay | Admitting: Family Medicine

## 2022-07-18 DIAGNOSIS — R42 Dizziness and giddiness: Secondary | ICD-10-CM

## 2022-07-18 MED ORDER — ALLOPURINOL 300 MG PO TABS: 300.0000 mg | ORAL_TABLET | Freq: Every evening | ORAL | 0 refills | Status: DC

## 2022-07-18 NOTE — Telephone Encounter (Signed)
  Prescription Request  07/18/2022  Is this a "Controlled Substance" medicine? no  Have you seen your PCP in the last 2 weeks? no  If YES, route message to pool  -  If NO, patient needs to be scheduled for appointment.  What is the name of the medication or equipment? Allopurinol 300 mg #90- Meclizine 25 mg  Have you contacted your pharmacy to request a refill? NO   Which pharmacy would you like this sent to? [facility] on [address] in [place]   Patient notified that their request is being sent to the clinical staff for review and that they should receive a response within 2 business days.

## 2022-07-19 MED ORDER — MECLIZINE HCL 25 MG PO TABS: 25.0000 mg | ORAL_TABLET | Freq: Three times a day (TID) | ORAL | 2 refills | Status: DC | PRN

## 2022-07-19 NOTE — Telephone Encounter (Signed)
LMOVM refills sent to pharmacy °

## 2022-08-02 ENCOUNTER — Ambulatory Visit (INDEPENDENT_AMBULATORY_CARE_PROVIDER_SITE_OTHER): Payer: Medicare Other

## 2022-08-02 VITALS — Ht 66.0 in | Wt 185.0 lb

## 2022-08-02 DIAGNOSIS — Z0000 Encounter for general adult medical examination without abnormal findings: Secondary | ICD-10-CM | POA: Diagnosis not present

## 2022-08-02 DIAGNOSIS — Z780 Asymptomatic menopausal state: Secondary | ICD-10-CM

## 2022-08-02 NOTE — Progress Notes (Signed)
Subjective:   [name] is a 71 y.o. female who presents for Medicare Annual (Subsequent) preventive examination.   I connected with  [name] on 08/02/22 by a audio enabled telemedicine application and verified that I am speaking with the correct person using two identifiers.  Patient Location: Home  Provider Location: Home Office  I discussed the limitations of evaluation and management by telemedicine. The patient expressed understanding and agreed to proceed.  Review of Systems     Cardiac Risk Factors include: advanced age (>59mn, >>66women);hypertension     Objective:    Today's Vitals   08/02/22 1521  Weight: 185 lb (83.9 kg)  Height: '5\' 6"'$  (1.676 m)   Body mass index is 29.86 kg/m.     08/02/2022    3:26 PM 07/31/2021    2:45 PM 07/10/2020    4:21 PM 08/16/2018   12:20 AM 07/12/2015    9:49 AM 12/06/2014    6:29 PM  Advanced Directives  Does Patient Have a Medical Advance Directive? No No No No No No  Would patient like information on creating a medical advance directive? No - Patient declined Yes (MAU/Ambulatory/Procedural Areas - Information given)  No - Patient declined No - patient declined information No - patient declined information    Current Medications (verified) Outpatient Encounter Medications as of 08/02/2022  Medication Sig   albuterol (VENTOLIN HFA) 108 (90 Base) MCG/ACT inhaler Inhale 2 puffs into the lungs every 6 (six) hours as needed for wheezing or shortness of breath.   allopurinol (ZYLOPRIM) 300 MG tablet Take 1 tablet (300 mg total) by mouth every evening.   Ascorbic Acid (VITAMIN C PO) Take by mouth.   atenolol (TENORMIN) 50 MG tablet TAKE 1 TABLET BY MOUTH DAILY   BIOTIN PO Take by mouth.   budesonide (PULMICORT) 180 MCG/ACT inhaler Inhale 2 puffs into the lungs 2 (two) times daily.   cetirizine (ZYRTEC) 10 MG tablet Take 1 tablet by mouth daily.   cholecalciferol (VITAMIN D3) 25 MCG (1000 UT) tablet Take 1,000 Units by  mouth daily.   esomeprazole (NEXIUM) 20 MG capsule Take 20 mg by mouth daily at 12 noon.   glucose blood (CONTOUR NEXT TEST) test strip Test BS daily and as needed Dx R73.09   Lancets 30G MISC Test BS daily and as needed Dx R73.09   meclizine (ANTIVERT) 25 MG tablet Take 1 tablet (25 mg total) by mouth 3 (three) times daily as needed for dizziness.   montelukast (SINGULAIR) 10 MG tablet TAKE 1 TABLET BY MOUTH IN THE  MORNING   Multiple Vitamin (MULTIVITAMIN) capsule Take 1 capsule by mouth daily.    sertraline (ZOLOFT) 50 MG tablet Take 1 tablet (50 mg total) by mouth daily.   No facility-administered encounter medications on file as of 08/02/2022.    Allergies (verified) Bee venom, Crestor [rosuvastatin], Erythromycin, Rosuvastatin calcium, Pravastatin, Zetia [ezetimibe], Aleve [naproxen sodium], Tetracyclines & related, and Vytorin [ezetimibe-simvastatin]   History: Past Medical History:  Diagnosis Date   Allergy    Asthma    Back pain    Cancer ([place]    skin cancer   Depression    Hyperlipidemia    Past Surgical History:  Procedure Laterality Date   bladder mesh     COLONOSCOPY N/A 07/12/2015   Procedure: COLONOSCOPY;  Surgeon: NRogene Houston MD;  Location: [facility];  Service: Endoscopy;  Laterality: N/A;  1[place] 02/2007  dipyridamole myoview; 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%, no wall motion abnormalities, no inducible ischemia, low risk    OVARIAN CYST REMOVAL  1987   TONSILECTOMY/ADENOIDECTOMY WITH MYRINGOTOMY  1958   TONSILLECTOMY     TRANSTHORACIC ECHOCARDIOGRAM  02/2007   RV mildly dlated; LV normal in size; mild MR with thickened MV leaflets; mild TR; mild pulm valve regurg   VAGINAL DELIVERY     x3   Family History  Problem Relation Age of Onset   Heart disease Mother    Kidney disease Mother    Breast cancer Mother    Coronary artery disease Mother        CABG at age 32   Dementia Father    Stroke Father    Stroke Maternal Grandmother    Cancer  Maternal Grandfather    Cancer Paternal Grandmother    Kidney disease Paternal Grandfather    Social History   Socioeconomic History   Marital status: Married    Spouse name: Not on file   Number of children: Not on file   Years of education: Not on file   Highest education level: Not on file  Occupational History   Not on file  Tobacco Use   Smoking status: Never   Smokeless tobacco: Never  Vaping Use   Vaping Use: Never used  Substance and Sexual Activity   Alcohol use: No   Drug use: No   Sexual activity: Not on file  Other Topics Concern   Not on file  Social History Narrative   Not on file   Social Determinants of Health   Financial Resource Strain: Low Risk  (08/02/2022)   Overall Financial Resource Strain (CARDIA)    Difficulty of Paying Living Expenses: Not hard at all  Food Insecurity: No Food Insecurity (08/02/2022)   Hunger Vital Sign    Worried About Running Out of Food in the Last Year: Never true    Ran Out of Food in the Last Year: Never true  Transportation Needs: No Transportation Needs (08/02/2022)   PRAPARE - Hydrologist (Medical): No    Lack of Transportation (Non-Medical): No  Physical Activity: Insufficiently Active (08/02/2022)   Exercise Vital Sign    Days of Exercise per Week: 3 days    Minutes of Exercise per Session: 30 min  Stress: No Stress Concern Present (08/02/2022)   [place]    Feeling of Stress : Not at all  Social Connections: [place] (08/02/2022)   Social Connection and Isolation Panel [NHANES]    Frequency of Communication with Friends and Family: More than three times a week    Frequency of Social Gatherings with Friends and Family: More than three times a week    Attends Religious Services: More than 4 times per year    Active Member of [employer] or Organizations: Yes    Attends Music therapist: More than 4  times per year    Marital Status: Married    Tobacco Counseling Counseling given: Not Answered   Clinical Intake:  Pre-visit preparation completed: Yes  Pain : No/denies pain     Nutritional Risks: None Diabetes: No  How often do you need to have someone help you when you read instructions, pamphlets, or other written materials from your doctor or pharmacy?: 1 - Never  Diabetic?no   Interpreter Needed?: No  Information entered by :: Jadene Pierini, LPN   Activities of Daily Living  08/02/2022    3:25 PM  In your present state of health, do you have any difficulty performing the following activities:  Hearing? 0  Vision? 0  Difficulty concentrating or making decisions? 0  Walking or climbing stairs? 0  Dressing or bathing? 0  Doing errands, shopping? 0  Preparing Food and eating ? N  Using the Toilet? N  In the past six months, have you accidently leaked urine? N  Do you have problems with loss of bowel control? N  Managing your Medications? N  Managing your Finances? N  Housekeeping or managing your Housekeeping? N    Patient Care Team: Janora Norlander, DO as PCP - General (Family Medicine) Debara Pickett Nadean Corwin, MD as PCP - Cardiology (Cardiology)  Indicate any recent Medical Services you may have received from other than [facility] providers in the past year (date may be approximate).     Assessment:   This is a routine wellness examination for Karys.  Hearing/Vision screen Vision Screening - Comments:: Wears rx glasses - up to date with routine eye exams with    Dietary issues and exercise activities discussed: Current Exercise Habits: Home exercise routine, Type of exercise: walking, Time (Minutes): 30, Frequency (Times/Week): 3, Weekly Exercise (Minutes/Week): 90, Intensity: Mild, Exercise limited by: None identified   Goals Addressed             This Visit's Progress    Increase physical activity   On track      Depression Screen     08/02/2022    3:24 PM 06/25/2022   11:20 AM 02/15/2022   11:40 AM 01/22/2022    3:52 PM 07/31/2021    2:55 PM 04/23/2021   11:26 AM 04/10/2021    2:08 PM  PHQ 2/9 Scores  PHQ - 2 Score 0 0 0 0 0 0 0  PHQ- 9 Score   0  0 0     Fall Risk    08/02/2022    3:22 PM 06/25/2022   11:20 AM 02/15/2022   11:39 AM 01/22/2022    3:52 PM 07/31/2021    2:55 PM  [place] in the past year? 0 0 0 0 0  Number falls in past yr: 0      Injury with Fall? 0      Risk for fall due to : No Fall Risks      Follow up Falls prevention discussed        FALL RISK PREVENTION PERTAINING TO THE HOME:  Any stairs in or around the home? No  If so, are there any without handrails? No  Home free of loose throw rugs in walkways, pet beds, electrical cords, etc? Yes  Adequate lighting in your home to reduce risk of falls? Yes   ASSISTIVE DEVICES UTILIZED TO PREVENT FALLS:  Life alert? No  Use of a cane, walker or w/c? No  Grab bars in the bathroom? Yes  Shower chair or bench in shower? No  Elevated toilet seat or a handicapped toilet? No       07/31/2021    2:03 PM 12/09/2014    9:00 AM  MMSE - Mini Mental State Exam  Orientation to time 5 5  Orientation to Place 5 5  Registration 3 3  Attention/ Calculation 5 5  Recall 3 2  Language- name 2 objects 2 2  Language- repeat 1 1  Language- follow 3 step command 3 3  Language- read & follow direction 1 1  Write a sentence 1 1  Copy design 1 1  Total score 30 29        08/02/2022    3:26 PM  6CIT Screen  What Year? 0 points  What month? 0 points  What time? 0 points  Count back from 20 0 points  Months in reverse 0 points  Repeat phrase 0 points  Total Score 0 points    Immunizations Immunization History  Administered Date(s) Administered   Fluad Quad(high Dose 65+) 08/22/2017, 05/14/2019, 08/14/2020, 07/31/2021   Influenza, High Dose Seasonal PF 06/12/2016, 08/22/2017, 06/20/2018   Influenza, Quadrivalent, Recombinant, Inj, Pf  05/29/2019   Influenza,trivalent, recombinat, inj, PF 09/12/2014   Influenza-Unspecified 09/12/2014, 06/12/2016, 08/22/2017, 06/20/2018, 05/29/2019   Moderna Sars-Covid-2 Vaccination 11/11/2019, 12/10/2019   Pneumococcal Conjugate-13 08/15/2016   Pneumococcal Polysaccharide-23 07/13/2019   Tdap 02/08/2020   Zoster Recombinat (Shingrix) 10/14/2019, 01/14/2020   Zoster, Live 12/04/2012    TDAP status: Up to date  Flu Vaccine status: Due, Education has been provided regarding the importance of this vaccine. Advised may receive this vaccine at local pharmacy or Health Dept. Aware to provide a copy of the vaccination record if obtained from local pharmacy or Health Dept. Verbalized acceptance and understanding.  Pneumococcal vaccine status: Up to date  Covid-19 vaccine status: Completed vaccines  Qualifies for Shingles Vaccine? Yes   Zostavax completed Yes   Shingrix Completed?: Yes  Screening Tests Health Maintenance  Topic Date Due   DEXA SCAN  Never done   COVID-19 Vaccine (3 - Moderna risk series) 01/07/2020   INFLUENZA VACCINE  12/15/2022 (Originally 04/16/2022)   MAMMOGRAM  06/25/2023 (Originally 06/06/2023)   Medicare Annual Wellness (AWV)  08/03/2023   COLONOSCOPY (Pts 45-43yr Insurance coverage will need to be confirmed)  07/11/2025   Pneumonia Vaccine 71 Years old  Completed   Hepatitis C Screening  Completed   Zoster Vaccines- Shingrix  Completed   HPV VACCINES  Aged Out    Health Maintenance  Health Maintenance Due  Topic Date Due   DEXA SCAN  Never done   COVID-19 Vaccine (3 - Moderna risk series) 01/07/2020    Colorectal cancer screening: Type of screening: Colonoscopy. Completed 07/12/2015. Repeat every 10 years  Mammogram status: Completed 06/05/2022. Repeat every year  Bone Density status: Ordered 08/02/2022. Pt provided with contact info and advised to call to schedule appt.  Lung Cancer Screening: (Low Dose CT Chest recommended if Age 423-80years, 30  pack-year currently smoking OR have quit w/in 15years.) does not qualify.   Lung Cancer Screening Referral: n/a  Additional Screening:  Hepatitis C Screening: does not qualify; Completed 01/18/2020  Vision Screening: Recommended annual ophthalmology exams for early detection of glaucoma and other disorders of the eye. Is the patient up to date with their annual eye exam?  Yes  Who is the provider or what is the name of the office in which the patient attends annual eye exams? [facility]  If pt is not established with a provider, would they like to be referred to a provider to establish care? No .   Dental Screening: Recommended annual dental exams for proper oral hygiene  Community Resource Referral / Chronic Care Management: CRR required this visit?  No   CCM required this visit?  No      Plan:     I have personally reviewed and noted the following in the patient's chart:   Medical and social history Use of alcohol, tobacco or illicit drugs  Current medications  and supplements including opioid prescriptions. Patient is not currently taking opioid prescriptions. Functional ability and status Nutritional status Physical activity Advanced directives List of other physicians Hospitalizations, surgeries, and ER visits in previous 12 months Vitals Screenings to include cognitive, depression, and falls Referrals and appointments  In addition, I have reviewed and discussed with patient certain preventive protocols, quality metrics, and best practice recommendations. A written personalized care plan for preventive services as well as general preventive health recommendations were provided to patient.     Daphane Shepherd, LPN   00/51/1021   Nurse Notes: Due Flu vaccine

## 2022-08-02 NOTE — Patient Instructions (Signed)
[name] , Thank you for taking time to come for your Medicare Wellness Visit. I appreciate your ongoing commitment to your health goals. Please review the following plan we discussed and let me know if I can assist you in the future.   These are the goals we discussed:  Goals      Increase physical activity        This is a list of the screening recommended for you and due dates:  Health Maintenance  Topic Date Due   DEXA scan (bone density measurement)  Never done   COVID-19 Vaccine (3 - Moderna risk series) 01/07/2020   Flu Shot  12/15/2022*   Mammogram  06/25/2023*   Medicare Annual Wellness Visit  08/03/2023   Colon Cancer Screening  07/11/2025   Pneumonia Vaccine  Completed   Hepatitis C Screening: USPSTF Recommendation to screen - Ages 18-79 yo.  Completed   Zoster (Shingles) Vaccine  Completed   HPV Vaccine  Aged Out  *Topic was postponed. The date shown is not the original due date.    Advanced directives: Advance directive discussed with you today. I have provided a copy for you to complete at home and have notarized. Once this is complete please bring a copy in to our office so we can scan it into your chart.   Conditions/risks identified: Aim for 30 minutes of exercise or brisk walking, 6-8 glasses of water, and 5 servings of fruits and vegetables each day.   Next appointment: Follow up in one year for your annual wellness visit    Preventive Care 65 Years and Older, Female Preventive care refers to lifestyle choices and visits with your health care provider that can promote health and wellness. What does preventive care include? A yearly physical exam. This is also called an annual well check. Dental exams once or twice a year. Routine eye exams. Ask your health care provider how often you should have your eyes checked. Personal lifestyle choices, including: Daily care of your teeth and gums. Regular physical activity. Eating a healthy diet. Avoiding tobacco  and drug use. Limiting alcohol use. Practicing safe sex. Taking low-dose aspirin every day. Taking vitamin and mineral supplements as recommended by your health care provider. What happens during an annual well check? The services and screenings done by your health care provider during your annual well check will depend on your age, overall health, lifestyle risk factors, and family history of disease. Counseling  Your health care provider may ask you questions about your: Alcohol use. Tobacco use. Drug use. Emotional well-being. Home and relationship well-being. Sexual activity. Eating habits. History of falls. Memory and ability to understand (cognition). Work and work Statistician. Reproductive health. Screening  You may have the following tests or measurements: Height, weight, and BMI. Blood pressure. Lipid and cholesterol levels. These may be checked every 5 years, or more frequently if you are over 68 years old. Skin check. Lung cancer screening. You may have this screening every year starting at age 62 if you have a 30-pack-year history of smoking and currently smoke or have quit within the past 15 years. Fecal occult blood test (FOBT) of the stool. You may have this test every year starting at age 66. Flexible sigmoidoscopy or colonoscopy. You may have a sigmoidoscopy every 5 years or a colonoscopy every 10 years starting at age 37. Hepatitis C blood test. Hepatitis B blood test. Sexually transmitted disease (STD) testing. Diabetes screening. This is done by checking your blood sugar (glucose)  after you have not eaten for a while (fasting). You may have this done every 1-3 years. Bone density scan. This is done to screen for osteoporosis. You may have this done starting at age 45. Mammogram. This may be done every 1-2 years. Talk to your health care provider about how often you should have regular mammograms. Talk with your health care provider about your test results,  treatment options, and if necessary, the need for more tests. Vaccines  Your health care provider may recommend certain vaccines, such as: Influenza vaccine. This is recommended every year. Tetanus, diphtheria, and acellular pertussis (Tdap, Td) vaccine. You may need a Td booster every 10 years. Zoster vaccine. You may need this after age 21. Pneumococcal 13-valent conjugate (PCV13) vaccine. One dose is recommended after age 42. Pneumococcal polysaccharide (PPSV23) vaccine. One dose is recommended after age 8. Talk to your health care provider about which screenings and vaccines you need and how often you need them. This information is not intended to replace advice given to you by your health care provider. Make sure you discuss any questions you have with your health care provider. Document Released: 09/29/2015 Document Revised: 05/22/2016 Document Reviewed: 07/04/2015 Elsevier Interactive Patient Education  2017 [employer] Prevention in the Home Falls can cause injuries. They can happen to people of all ages. There are many things you can do to make your home safe and to help prevent falls. What can I do on the outside of my home? Regularly fix the edges of walkways and driveways and fix any cracks. Remove anything that might make you trip as you walk through a door, such as a raised step or threshold. Trim any bushes or trees on the path to your home. Use bright outdoor lighting. Clear any walking paths of anything that might make someone trip, such as rocks or tools. Regularly check to see if handrails are loose or broken. Make sure that both sides of any steps have handrails. Any raised decks and porches should have guardrails on the edges. Have any leaves, snow, or ice cleared regularly. Use sand or salt on walking paths during winter. Clean up any spills in your garage right away. This includes oil or grease spills. What can I do in the bathroom? Use night  lights. Install grab bars by the toilet and in the tub and shower. Do not use towel bars as grab bars. Use non-skid mats or decals in the tub or shower. If you need to sit down in the shower, use a plastic, non-slip stool. Keep the floor dry. Clean up any water that spills on the floor as soon as it happens. Remove soap buildup in the tub or shower regularly. Attach bath mats securely with double-sided non-slip rug tape. Do not have throw rugs and other things on the floor that can make you trip. What can I do in the bedroom? Use night lights. Make sure that you have a light by your bed that is easy to reach. Do not use any sheets or blankets that are too big for your bed. They should not hang down onto the floor. Have a firm chair that has side arms. You can use this for support while you get dressed. Do not have throw rugs and other things on the floor that can make you trip. What can I do in the kitchen? Clean up any spills right away. Avoid walking on wet floors. Keep items that you use a lot in easy-to-reach places. If  you need to reach something above you, use a strong step stool that has a grab bar. Keep electrical cords out of the way. Do not use floor polish or wax that makes floors slippery. If you must use wax, use non-skid floor wax. Do not have throw rugs and other things on the floor that can make you trip. What can I do with my stairs? Do not leave any items on the stairs. Make sure that there are handrails on both sides of the stairs and use them. Fix handrails that are broken or loose. Make sure that handrails are as long as the stairways. Check any carpeting to make sure that it is firmly attached to the stairs. Fix any carpet that is loose or worn. Avoid having throw rugs at the top or bottom of the stairs. If you do have throw rugs, attach them to the floor with carpet tape. Make sure that you have a light switch at the top of the stairs and the bottom of the stairs. If  you do not have them, ask someone to add them for you. What else can I do to help prevent falls? Wear shoes that: Do not have high heels. Have rubber bottoms. Are comfortable and fit you well. Are closed at the toe. Do not wear sandals. If you use a stepladder: Make sure that it is fully opened. Do not climb a closed stepladder. Make sure that both sides of the stepladder are locked into place. Ask someone to hold it for you, if possible. Clearly mark and make sure that you can see: Any grab bars or handrails. First and last steps. Where the edge of each step is. Use tools that help you move around (mobility aids) if they are needed. These include: Canes. Walkers. Scooters. Crutches. Turn on the lights when you go into a dark area. Replace any light bulbs as soon as they burn out. Set up your furniture so you have a clear path. Avoid moving your furniture around. If any of your floors are uneven, fix them. If there are any pets around you, be aware of where they are. Review your medicines with your doctor. Some medicines can make you feel dizzy. This can increase your chance of falling. Ask your doctor what other things that you can do to help prevent falls. This information is not intended to replace advice given to you by your health care provider. Make sure you discuss any questions you have with your health care provider. Document Released: 06/29/2009 Document Revised: 02/08/2016 Document Reviewed: 10/07/2014 Elsevier Interactive Patient Education  2017 [employer].

## 2022-08-05 ENCOUNTER — Ambulatory Visit (INDEPENDENT_AMBULATORY_CARE_PROVIDER_SITE_OTHER): Payer: Medicare Other

## 2022-08-05 DIAGNOSIS — Z23 Encounter for immunization: Secondary | ICD-10-CM | POA: Diagnosis not present

## 2022-08-05 NOTE — Progress Notes (Signed)
Tolerated flu well

## 2022-08-14 ENCOUNTER — Ambulatory Visit (HOSPITAL_COMMUNITY): Payer: Medicare Other | Attending: Family Medicine

## 2022-08-14 DIAGNOSIS — R42 Dizziness and giddiness: Secondary | ICD-10-CM | POA: Insufficient documentation

## 2022-08-14 NOTE — Therapy (Signed)
OUTPATIENT PHYSICAL THERAPY VESTIBULAR EVALUATION     Patient Name: [name] MRN: [id number] DOB:Oct 25, 1950, 71 y.o., female Today's Date: 08/15/2022  END OF SESSION:  PT End of Session - 08/14/22 1349     Visit Number 1    Number of Visits 6    Date for PT Re-Evaluation 09/25/22    Authorization Type [employer]    Authorization Time Period no auth required    PT Start Time 0144    PT Stop Time 0225    PT Time Calculation (min) 41 min             Past Medical History:  Diagnosis Date   Allergy    Asthma    Back pain    Cancer ([place])    skin cancer   Depression    Hyperlipidemia    Past Surgical History:  Procedure Laterality Date   bladder mesh     COLONOSCOPY N/A 07/12/2015   Procedure: COLONOSCOPY;  Surgeon: Rogene Houston, MD;  Location: [facility];  Service: Endoscopy;  Laterality: N/A;  [place]  02/2007   dipyridamole myoview; EF 70%, no wall motion abnormalities, no inducible ischemia, low risk    OVARIAN CYST REMOVAL  1987   TONSILECTOMY/ADENOIDECTOMY WITH MYRINGOTOMY  1958   TONSILLECTOMY     TRANSTHORACIC ECHOCARDIOGRAM  02/2007   RV mildly dlated; LV normal in size; mild MR with thickened MV leaflets; mild TR; mild pulm valve regurg   VAGINAL DELIVERY     x3   Patient Active Problem List   Diagnosis Date Noted   Change in stool 07/24/2020   Gastroesophageal reflux disease without esophagitis 02/08/2020   Asthma    Family history of heart disease 11/21/2017   Other fatigue 11/21/2017   Atypical chest pain 06/21/2016   Seizures ([place]) 12/14/2014   Transient memory loss 12/09/2014   Essential hypertension 12/09/2014   Mixed hyperlipidemia 12/09/2014    PCP: [facility]; Adam Phenix, MD REFERRING PROVIDER: Aldean Jewett, MD  REFERRING DIAG: R42 (ICD-10-CM) - Vertigo  THERAPY DIAG:  Dizziness and giddiness  ONSET DATE: about 2 months ago  Rationale for Evaluation and Treatment: Rehabilitation  SUBJECTIVE:    SUBJECTIVE STATEMENT: Lightheaded spells occasionally; now getting more vertigo type symptoms with the room spinning.  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  Patient has not noticed anything that brings it on. 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 No  PRECAUTIONS: None  WEIGHT BEARING RESTRICTIONS: No  FALLS: Has patient fallen in last 6 months? No  LIVING ENVIRONMENT: Lives with: lives with their spouse Lives in: House/apartment Stairs: Yes: External: 6 steps; on right going up, on left going up, and can reach both Has following equipment at home: Single point cane and Grab bars  PLOF: Independent  PATIENT GOALS: stop having dizziness  OBJECTIVE:   DIAGNOSTIC FINDINGS: none  COGNITION: Overall cognitive status: Within functional limits for tasks assessed   SENSATION: WFL  EDEMA:  none   Cervical ROM:  grossly wfl throughout  Active AROM (deg) eval  Flexion   Extension   Right lateral flexion   Left lateral flexion   Right rotation   Left rotation   (Blank rows = not tested)  STRENGTH: bilateral upper extremity and lower extremity AROM and strength grossly wfl  throughout  LOWER EXTREMITY MMT:   MMT Right eval Left eval  Hip flexion    Hip abduction    Hip adduction    Hip internal rotation    Hip external rotation    Knee flexion    Knee extension    Ankle dorsiflexion    Ankle plantarflexion    Ankle inversion    Ankle eversion    (Blank rows = not tested)  BED MOBILITY:  Sit to supine Complete Independence Supine to sit Complete Independence  TRANSFERS: Assistive device utilized:  none   Sit to stand: Complete Independence Stand to sit: Complete Independence GAIT: Gait pattern: WFL Distance walked: 80 ft to PT gym Assistive device utilized: None Level of assistance: Complete  Independence Comments: no LOB noted although patient states she is feeling dizzy  PATIENT SURVEYS:  DHI test next visit  VESTIBULAR ASSESSMENT:  GENERAL OBSERVATION: no noted nystagumus at rest   SYMPTOM BEHAVIOR:  Subjective history: reports insidious onset of dizziness  Non-Vestibular symptoms: changes in vision  Type of dizziness: Spinning/Vertigo and Lightheadedness/Faint  Frequency: several times a week  Duration: varies  Aggravating factors: No known aggravating factors  Relieving factors: no known relieving factors  Progression of symptoms: better  OCULOMOTOR EXAM:  Ocular Alignment: normal  Ocular ROM: No Limitations  Spontaneous Nystagmus: absent  Gaze-Induced Nystagmus: absent  Smooth Pursuits: intact  Saccades:  not tested  Convergence/Divergence: not tested  VESTIBULAR - OCULAR REFLEX:   Slow VOR: Comment: not tested  VOR Cancellation: Comment: not tested  Head-Impulse Test: not tested  Dynamic Visual Acuity:  not tested   POSITIONAL TESTING: Right Dix-Hallpike: no nystagmus Left Dix-Hallpike: no nystagmus  MOTION SENSITIVITY:  Motion Sensitivity Quotient Intensity: 0 = none, 1 = Lightheaded, 2 = Mild, 3 = Moderate, 4 = Severe, 5 = Vomiting  Intensity  1. Sitting to supine 1  2. Supine to L side   3. Supine to R side   4. Supine to sitting 1  5. L Hallpike-Dix 1  6. Up from L  1  7. R Hallpike-Dix 1  8. Up from R  1  9. Sitting, head tipped to L knee 1  10. Head up from L knee 1  11. Sitting, head tipped to R knee 1  12. Head up from R knee 1  13. Sitting head turns x5 1  14.Sitting head nods x5 1  15. In stance, 180 turn to L    16. In stance, 180 turn to R     OTHOSTATICS: not done  FUNCTIONAL GAIT:  not tested   VESTIBULAR TREATMENT:                                                                                                   DATE: 08/14/2022 evaluation and HEP instruction  Canalith Repositioning:  Comment: na Gaze  Adaptation:na   Habituation:  Brandt-Daroff: number of reps: 5 each side one time a day Other: issued handout  Education details: Patient educated on exam findings, POC, scope of PT, HEP. Person educated: Patient Education method: Explanation, Demonstration, and Handouts  Education comprehension: verbalized understanding, returned demonstration, verbal cues required, and tactile cues required  HOME EXERCISE PROGRAM: Access Code: [email or web address] URL: [email or web address] Date: 08/14/2022 Prepared by: [facility]  Exercises - Brandt-Daroff Vestibular Exercise  - 1 x daily - 7 x weekly - 1 sets - 5 reps   GOALS: Goals reviewed with patient? No  SHORT TERM GOALS: Target date: 08/26/2022  Patient will be independent with initial HEP  Baseline: Goal status: INITIAL  2.   Patient will report at least 50% improvement in overall symptoms and/or function to demonstrate improved functional mobility   Baseline:  Goal status: INITIAL   LONG TERM GOALS: Target date: 09/11/2022  Patient will be independent in self management strategies to improve quality of life and functional outcomes.   Baseline:  Goal status: INITIAL  2.  Patient will have 1 week without complaint of dizziness during her daily activity at home to improve efficiency with household tasks.  Baseline:  Goal status: INITIAL  3.  Patient will improve her score on DHI to demonstrate improved functional mobility/ decreased dizziness with functional tasks. Baseline:  Goal status: INITIAL   ASSESSMENT:  CLINICAL IMPRESSION: Patient is a 71 y.o. female  who was seen today for physical therapy evaluation and treatment for vertigo/dizziness. Patient demonstrates no increased vestibular symptoms with provocative testing today however has subjective reports of dizziness which is negatively impacting patient ability to perform ADLs and functional mobility tasks. Patient will benefit from skilled physical therapy  services to address these deficits to improve level of function with ADLs, functional mobility tasks, and reduce risk for falls.    OBJECTIVE IMPAIRMENTS: decreased activity tolerance, decreased knowledge of condition, difficulty walking, dizziness, and impaired perceived functional ability.   ACTIVITY LIMITATIONS: carrying, lifting, bending, sitting, standing, squatting, sleeping, stairs, transfers, bed mobility, locomotion level, and caring for others  PARTICIPATION LIMITATIONS: meal prep, cleaning, laundry, medication management, personal finances, interpersonal relationship, driving, shopping, community activity, yard work, and church  REHAB POTENTIAL: Good  CLINICAL DECISION MAKING: Evolving/moderate complexity  EVALUATION COMPLEXITY: Moderate   PLAN:  PT FREQUENCY: 1x/week  PT DURATION: 6 weeks  PLANNED INTERVENTIONS: Therapeutic exercises, Therapeutic activity, Neuromuscular re-education, Balance training, Gait training, Patient/Family education, Self Care, Joint mobilization, Vestibular training, Canalith repositioning, Visual/preceptual remediation/compensation, Spinal manipulation, Spinal mobilization, Cryotherapy, Moist heat, Biofeedback, and Re-evaluation  PLAN FOR NEXT SESSION: Review response to HEP; test lateral canals; [place]   7:21 AM, 08/15/22 Magaby Rumberger Small Muskan Bolla MPT [facility] [place] [phone] Ph:[phone]

## 2022-08-21 ENCOUNTER — Ambulatory Visit (HOSPITAL_COMMUNITY): Payer: Medicare Other | Attending: Family Medicine | Admitting: Physical Therapy

## 2022-08-21 DIAGNOSIS — R42 Dizziness and giddiness: Secondary | ICD-10-CM | POA: Insufficient documentation

## 2022-08-21 NOTE — Therapy (Signed)
OUTPATIENT PHYSICAL THERAPY VESTIBULAR EVALUATION     Patient Name: [name] MRN: [id number] DOB:09/28/1950, 71 y.o., female Today's Date: 08/21/2022  END OF SESSION:  PT End of Session - 08/21/22 0950    Visit Number 2    Number of Visits 6    Date for PT Re-Evaluation 09/25/22    Authorization Type U[employer]    Authorization Time Period no auth required    PT Start Time 0915    PT Stop Time 0945    PT Time Calculation (min) 30 min    Activity Tolerance Patient tolerated treatment well    Behavior During Therapy [facility] for tasks assessed/performed             Past Medical History:  Diagnosis Date   Allergy    Asthma    Back pain    Cancer ([place])    skin cancer   Depression    Hyperlipidemia    Past Surgical History:  Procedure Laterality Date   bladder mesh     COLONOSCOPY N/A 07/12/2015   Procedure: COLONOSCOPY;  Surgeon: Rogene Houston, MD;  [facility];  Service: Endoscopy;  Laterality: N/A;  [place]  02/2007   dipyridamole myoview; EF 70%, no wall motion abnormalities, no inducible ischemia, low risk    OVARIAN CYST REMOVAL  1987   TONSILECTOMY/ADENOIDECTOMY WITH MYRINGOTOMY  1958   TONSILLECTOMY     TRANSTHORACIC ECHOCARDIOGRAM  02/2007   RV mildly dlated; LV normal in size; mild MR with thickened MV leaflets; mild TR; mild pulm valve regurg   VAGINAL DELIVERY     x3   Patient Active Problem List   Diagnosis Date Noted   Change in stool 07/24/2020   Gastroesophageal reflux disease without esophagitis 02/08/2020   Asthma    Family history of heart disease 11/21/2017   Other fatigue 11/21/2017   Atypical chest pain 06/21/2016   Seizures ([place]) 12/14/2014   Transient memory loss 12/09/2014   Essential hypertension 12/09/2014   Mixed hyperlipidemia 12/09/2014    PCP: [facility]; Adam Phenix, MD REFERRING PROVIDER: Aldean Jewett, MD  REFERRING DIAG: R42 (ICD-10-CM) - Vertigo  THERAPY DIAG:  Dizziness and  giddiness  ONSET DATE: about 2 months ago  Rationale for Evaluation and Treatment: Rehabilitation  SUBJECTIVE:   SUBJECTIVE STATEMENT: Pt states that she has been doing her exercises.  She has more light headedness.  She has not noted any difference with the exercises.  This morning she was putting on her make-up and she got light headed. She occasionally gets dizzy when she looks up.  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 No  PRECAUTIONS: None  WEIGHT BEARING RESTRICTIONS: No  FALLS: Has patient fallen in last 6 months? No  LIVING ENVIRONMENT: Lives with: lives with their spouse Lives in: House/apartment Stairs: Yes: External: 6 steps; on right going up, on left going up, and can reach both Has following equipment at home: Single point cane and Grab bars  PLOF: Independent  PATIENT GOALS: stop having dizziness  OBJECTIVE:  Cervical ROM:  grossly wfl throughout   STRENGTH: bilateral upper extremity and lower extremity AROM and strength grossly wfl throughout  Gait pattern: WFL Distance walked: 80 ft to PT gym Assistive device utilized: None Level of assistance: Complete Independence Comments: no LOB noted although patient states she is feeling dizzy  PATIENT SURVEYS:  DHI test next visit  VESTIBULAR ASSESSMENT:  GENERAL OBSERVATION: no noted nystagumus at rest   SYMPTOM BEHAVIOR:  Subjective history: reports insidious onset of dizziness  Non-Vestibular symptoms: changes in vision  Type of dizziness: Spinning/Vertigo and Lightheadedness/Faint  Frequency: several times a week  Duration: varies  Aggravating factors: No known aggravating factors  Relieving factors: no known relieving factors  Progression of symptoms: better  OCULOMOTOR EXAM:  Ocular Alignment: normal  Ocular ROM: No Limitations  Spontaneous Nystagmus: absent  Gaze-Induced Nystagmus: absent  Smooth Pursuits:  intact  Saccades:  not tested  Convergence/Divergence: not tested  VESTIBULAR - OCULAR REFLEX:   Slow VOR: Comment: not tested  VOR Cancellation: Comment: not tested  Head-Impulse Test: not tested  Dynamic Visual Acuity:  not tested   POSITIONAL TESTING: Right Dix-Hallpike: no nystagmus Left Dix-Hallpike: no nystagmus  MOTION SENSITIVITY:  Motion Sensitivity Quotient Intensity: 0 = none, 1 = Lightheaded, 2 = Mild, 3 = Moderate, 4 = Severe, 5 = Vomiting  Intensity 08/21/22  1. Sitting to supine 1   2. Supine to L side    3. Supine to R side    4. Supine to sitting 1   5. L Hallpike-Dix 1 0  6. Up from L  1 0  7. R Hallpike-Dix 1 0  8. Up from R  1 0  9. Sitting, head tipped to L knee 1 0  10. Head up from L knee 1 0  11. Sitting, head tipped to R knee 1 0  12. Head up from R knee 1 0  13. Sitting head turns x5 1 0  14.Sitting head nods x5 1 0  15. In stance, 180 turn to L     16. In stance, 180 turn to R     OTHOSTATICS: not done  FUNCTIONAL GAIT:  not tested   VESTIBULAR TREATMENT:  08/21/22 Standing head turns (-) Standing vertical nods slight off balance Rhomberg with eyes closed slight off balance 1/4 turn to RT off balance 1/4 turn to LT(-).                                                                                                     DATE: 08/14/2022 evaluation and HEP instruction  Canalith Repositioning:  Comment: na Gaze Adaptation:na   Habituation:  Brandt-Daroff: number of reps: 5 each side one time a day Other: issued handout  Education details: Patient educated on exam findings, POC, scope of PT, HEP. Person educated: Patient Education method: Explanation, Demonstration, and Handouts Education comprehension: verbalized understanding, returned demonstration, verbal cues required, and tactile cues required  HOME EXERCISE PROGRAM: 12/6 Access Code: [email or web address] URL: [email or web address] Date: 08/21/2022 Prepared by: Rayetta Humphrey  Exercises - Standing Head Nod Vestibular Habituation  - 5-6 x daily - 7 x weekly - 1 sets - 5-10 reps - Standing Quarter Turn with Counter Support  - 4 x daily - 7 x weekly - 1 sets - 1 reps - Tandem Stance  - 3 x daily - 7 x weekly - 1 sets - 2 reps - as long as possible  hold  Access  Code: [email or web address] URL: [email or web address] Date: 08/14/2022 Prepared by: [facility] - [facility]  Exercises - Brandt-Daroff Vestibular Exercise  - 1 x daily - 7 x weekly - 1 sets - 5 reps   GOALS: Goals reviewed with patient?   SHORT TERM GOALS: Target date: 08/26/2022  Patient will be independent with initial HEP  Baseline: Goal status: IN PROGRESS  2.   Patient will report at least 50% improvement in overall symptoms and/or function to demonstrate improved functional mobility   Baseline:  Goal status: IN PROGRESS   LONG TERM GOALS: Target date: 09/11/2022  Patient will be independent in self management strategies to improve quality of life and functional outcomes.   Baseline:  Goal status: IN PROGRESS  2.  Patient will have 1 week without complaint of dizziness during her daily activity at home to improve efficiency with household tasks.  Baseline:  Goal status: IN PROGRESS  3.  Patient will improve her score on DHI to demonstrate improved functional mobility/ decreased dizziness with functional tasks. Baseline:  Goal status: IN PROGRESS   ASSESSMENT:  CLINICAL IMPRESSION: Tested all canals of the ears for BPPV all of which were (-).  Treatment focused on finding motions which caused sx to return.  Therapist gave Pt 3 habituation exercises to work on And requested pt to keep a diary to write down what activity she was completing when her sx occurred.  OBJECTIVE IMPAIRMENTS: decreased activity tolerance, decreased knowledge of condition, difficulty walking, dizziness, and impaired perceived functional ability.   ACTIVITY LIMITATIONS: carrying, lifting, bending,  sitting, standing, squatting, sleeping, stairs, transfers, bed mobility, locomotion level, and caring for others  PARTICIPATION LIMITATIONS: meal prep, cleaning, laundry, medication management, personal finances, interpersonal relationship, driving, shopping, community activity, yard work, and church  REHAB POTENTIAL: Good  CLINICAL DECISION MAKING: Evolving/moderate complexity  EVALUATION COMPLEXITY: Moderate    PLAN:  PT FREQUENCY: 1x/week  PT DURATION: 6 weeks  PLANNED INTERVENTIONS: Therapeutic exercises, Therapeutic activity, Neuromuscular re-education, Balance training, Gait training, Patient/Family education, Self Care, Joint mobilization, Vestibular training, Canalith repositioning, Visual/preceptual remediation/compensation, Spinal manipulation, Spinal mobilization, Cryotherapy, Moist heat, Biofeedback, and Re-evaluation  PLAN FOR NEXT SESSION: continue with habituation exercises. Rayetta Humphrey, [place] CLT [phone]  9:50

## 2022-08-28 ENCOUNTER — Ambulatory Visit (HOSPITAL_COMMUNITY): Payer: Medicare Other

## 2022-08-28 DIAGNOSIS — R42 Dizziness and giddiness: Secondary | ICD-10-CM | POA: Diagnosis not present

## 2022-08-28 NOTE — Therapy (Signed)
OUTPATIENT PHYSICAL THERAPY VESTIBULAR TREATMENT     Patient Name: [name] MRN: [id number] DOB:29-Dec-1950, 71 y.o., female Today's Date: 08/28/2022  END OF SESSION:  PT End of Session - 08/21/22 0950    Visit Number 2    Number of Visits 6    Date for PT Re-Evaluation 09/25/22    Authorization Type UHC    Authorization Time Period no auth required    PT Start Time 0915    PT Stop Time 0945    PT Time Calculation (min) 30 min    Activity Tolerance Patient tolerated treatment well    Behavior During Therapy [facility] for tasks assessed/performed             Past Medical History:  Diagnosis Date   Allergy    Asthma    Back pain    Cancer ([place])    skin cancer   Depression    Hyperlipidemia    Past Surgical History:  Procedure Laterality Date   bladder mesh     COLONOSCOPY N/A 07/12/2015   Procedure: COLONOSCOPY;  Surgeon: Rogene Houston, MD;  [facility];  Service: Endoscopy;  Laterality: N/A;  [place]  02/2007   dipyridamole myoview; EF 70%, no wall motion abnormalities, no inducible ischemia, low risk    OVARIAN CYST REMOVAL  1987   TONSILECTOMY/ADENOIDECTOMY WITH MYRINGOTOMY  1958   TONSILLECTOMY     TRANSTHORACIC ECHOCARDIOGRAM  02/2007   RV mildly dlated; LV normal in size; mild MR with thickened MV leaflets; mild TR; mild pulm valve regurg   VAGINAL DELIVERY     x3   Patient Active Problem List   Diagnosis Date Noted   Change in stool 07/24/2020   Gastroesophageal reflux disease without esophagitis 02/08/2020   Asthma    Family history of heart disease 11/21/2017   Other fatigue 11/21/2017   Atypical chest pain 06/21/2016   Seizures ([place]) 12/14/2014   Transient memory loss 12/09/2014   Essential hypertension 12/09/2014   Mixed hyperlipidemia 12/09/2014    PCP: [facility]; Adam Phenix, MD REFERRING PROVIDER: Aldean Jewett, MD  REFERRING DIAG: R42 (ICD-10-CM) - Vertigo  THERAPY DIAG:  Dizziness and  giddiness  ONSET DATE: about 2 months ago  Rationale for Evaluation and Treatment: Rehabilitation  SUBJECTIVE:   SUBJECTIVE STATEMENT: Has been keeping a log of her dizziness but forgot to bring it today; she has a bit of a headache today.  Reports dizziness is still present but not as severe; describes as lightheaded.  Reports lightheaded with bending down to look in cabinets and putting makeup on and fixing lunch.  She has misplaced her HEP when cleaning for a holiday gathering.   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 No  PRECAUTIONS: None  WEIGHT BEARING RESTRICTIONS: No  FALLS: Has patient fallen in last 6 months? No  LIVING ENVIRONMENT: Lives with: lives with their spouse Lives in: House/apartment Stairs: Yes: External: 6 steps; on right going up, on left going up, and can reach both Has following equipment at home: Single point cane and Grab bars  PLOF: Independent  PATIENT GOALS: stop having dizziness  OBJECTIVE:  Cervical ROM:  grossly wfl throughout   STRENGTH: bilateral upper extremity and lower extremity AROM and strength grossly wfl throughout  Gait pattern: WFL Distance walked: 80 ft to PT gym Assistive device utilized: None Level of assistance: Complete Independence Comments: no  LOB noted although patient states she is feeling dizzy  PATIENT SURVEYS:  DHI test next visit  VESTIBULAR ASSESSMENT:  GENERAL OBSERVATION: no noted nystagumus at rest   SYMPTOM BEHAVIOR:  Subjective history: reports insidious onset of dizziness  Non-Vestibular symptoms: changes in vision  Type of dizziness: Spinning/Vertigo and Lightheadedness/Faint  Frequency: several times a week  Duration: varies  Aggravating factors: No known aggravating factors  Relieving factors: no known relieving factors  Progression of symptoms: better  OCULOMOTOR EXAM:  Ocular Alignment: normal  Ocular ROM: No  Limitations  Spontaneous Nystagmus: absent  Gaze-Induced Nystagmus: absent  Smooth Pursuits: intact  Saccades:  not tested  Convergence/Divergence: not tested  VESTIBULAR - OCULAR REFLEX:   Slow VOR: Comment: not tested  VOR Cancellation: Comment: not tested  Head-Impulse Test: not tested  Dynamic Visual Acuity:  not tested   POSITIONAL TESTING: Right Dix-Hallpike: no nystagmus Left Dix-Hallpike: no nystagmus  MOTION SENSITIVITY:  Motion Sensitivity Quotient Intensity: 0 = none, 1 = Lightheaded, 2 = Mild, 3 = Moderate, 4 = Severe, 5 = Vomiting  Intensity 08/21/22  1. Sitting to supine 1   2. Supine to L side    3. Supine to R side    4. Supine to sitting 1   5. L Hallpike-Dix 1 0  6. Up from L  1 0  7. R Hallpike-Dix 1 0  8. Up from R  1 0  9. Sitting, head tipped to L knee 1 0  10. Head up from L knee 1 0  11. Sitting, head tipped to R knee 1 0  12. Head up from R knee 1 0  13. Sitting head turns x5 1 0  14.Sitting head nods x5 1 0  15. In stance, 180 turn to L     16. In stance, 180 turn to R     OTHOSTATICS: not done  FUNCTIONAL GAIT:  not tested   VESTIBULAR TREATMENT:  08/28/22 Standing with small bos head turns and nods x 10 Tandem stance with head nods and turns x 10 each 1/4 turns x 10 each way; no LOB but some complaint of lightheadness  Seated  Head turns x 10 Head nods x 10  08/21/22 Standing head turns (-) Standing vertical nods slight off balance Rhomberg with eyes closed slight off balance 1/4 turn to RT off balance 1/4 turn to LT(-).                                                                                                     DATE: 08/14/2022 evaluation and HEP instruction  Canalith Repositioning:  Comment: na Gaze Adaptation:na   Habituation:  Brandt-Daroff: number of reps: 5 each side one time a day Other: issued handout  Education details: Patient educated on exam findings, POC, scope of PT, HEP. Person educated:  Patient Education method: Explanation, Demonstration, and Handouts Education comprehension: verbalized understanding, returned demonstration, verbal cues required, and tactile cues required  HOME EXERCISE PROGRAM: 12/6 Access Code: [email or web address] URL: [email or web address] Date: 08/21/2022 Prepared by: Rayetta Humphrey  Exercises - Standing Head Nod Vestibular Habituation  -  5-6 x daily - 7 x weekly - 1 sets - 5-10 reps - Standing Quarter Turn with Counter Support  - 4 x daily - 7 x weekly - 1 sets - 1 reps - Tandem Stance  - 3 x daily - 7 x weekly - 1 sets - 2 reps - as long as possible  hold  Access Code: [email or web address] URL: [email or web address] Date: 08/14/2022 Prepared by: [facility] - [facility]  Exercises - Brandt-Daroff Vestibular Exercise  - 1 x daily - 7 x weekly - 1 sets - 5 reps   GOALS: Goals reviewed with patient?   SHORT TERM GOALS: Target date: 08/26/2022  Patient will be independent with initial HEP  Baseline: Goal status: IN PROGRESS  2.   Patient will report at least 50% improvement in overall symptoms and/or function to demonstrate improved functional mobility   Baseline:  Goal status: IN PROGRESS   LONG TERM GOALS: Target date: 09/11/2022  Patient will be independent in self management strategies to improve quality of life and functional outcomes.   Baseline:  Goal status: IN PROGRESS  2.  Patient will have 1 week without complaint of dizziness during her daily activity at home to improve efficiency with household tasks.  Baseline:  Goal status: IN PROGRESS  3.  Patient will improve her score on DHI to demonstrate improved functional mobility/ decreased dizziness with functional tasks. Baseline:  Goal status: IN PROGRESS   ASSESSMENT:  CLINICAL IMPRESSION: Patient with reports of headache and some general malaise to start treatment.  As we progress through treatment she complains of more nausea and so we discontinue treatment early  today.  Of all the exercises patient performed today; she had most complaint of light headness with 1/4 turns.  PT reprinted patient's HEP.  Patient will benefit from continued skilled therapy services to address deficits and promote return to optimal function.     OBJECTIVE IMPAIRMENTS: decreased activity tolerance, decreased knowledge of condition, difficulty walking, dizziness, and impaired perceived functional ability.   ACTIVITY LIMITATIONS: carrying, lifting, bending, sitting, standing, squatting, sleeping, stairs, transfers, bed mobility, locomotion level, and caring for others  PARTICIPATION LIMITATIONS: meal prep, cleaning, laundry, medication management, personal finances, interpersonal relationship, driving, shopping, community activity, yard work, and church  REHAB POTENTIAL: Good  CLINICAL DECISION MAKING: Evolving/moderate complexity  EVALUATION COMPLEXITY: Moderate    PLAN:  PT FREQUENCY: 1x/week  PT DURATION: 6 weeks  PLANNED INTERVENTIONS: Therapeutic exercises, Therapeutic activity, Neuromuscular re-education, Balance training, Gait training, Patient/Family education, Self Care, Joint mobilization, Vestibular training, Canalith repositioning, Visual/preceptual remediation/compensation, Spinal manipulation, Spinal mobilization, Cryotherapy, Moist heat, Biofeedback, and Re-evaluation  PLAN FOR NEXT SESSION: continue with habituation exercises.   8:56 AM, 08/28/22 Lieutenant Abarca Small Thetis Schwimmer MPT [place] [facility] [place] [phone]

## 2022-09-03 ENCOUNTER — Ambulatory Visit (HOSPITAL_COMMUNITY): Payer: Medicare Other | Admitting: Physical Therapy

## 2022-09-03 DIAGNOSIS — R42 Dizziness and giddiness: Secondary | ICD-10-CM | POA: Diagnosis not present

## 2022-09-03 NOTE — Therapy (Signed)
OUTPATIENT PHYSICAL THERAPY VESTIBULAR TREATMENT   PHYSICAL THERAPY DISCHARGE SUMMARY  Visits from Start of Care: 4  Current functional level related to goals / functional outcomes: All goals met   Remaining deficits: none   Education / Equipment: HEP   Patient agrees to discharge. Patient goals were met. Patient is being discharged due to meeting the stated rehab goals.  Patient Name: [name] MRN: [id number] DOB:[birth year], 71 y.o., female Today's Date: 09/03/2022  END OF SESSION:  PT End of Session - 09/03/22 0903     Visit Number 4    Number of Visits 4   Date for PT Re-Evaluation 09/25/22    Authorization Type [employer]    Authorization Time Period no auth required    PT Start Time 0825    PT Stop Time 0903    PT Time Calculation (min) 38 min    Activity Tolerance Patient tolerated treatment well    Behavior During Therapy [facility] for tasks assessed/performed              Past Medical History:  Diagnosis Date   Allergy    Asthma    Back pain    Cancer ([place])    skin cancer   Depression    Hyperlipidemia    Past Surgical History:  Procedure Laterality Date   bladder mesh     COLONOSCOPY N/A 07/12/2015   Procedure: COLONOSCOPY;  Surgeon: Rogene Houston, MD;  Location: [facility];  Service: Endoscopy;  Laterality: N/A;  [place]  02/2007   dipyridamole myoview; EF 70%, no wall motion abnormalities, no inducible ischemia, low risk    OVARIAN CYST REMOVAL  1987   TONSILECTOMY/ADENOIDECTOMY WITH MYRINGOTOMY  1958   TONSILLECTOMY     TRANSTHORACIC ECHOCARDIOGRAM  02/2007   RV mildly dlated; LV normal in size; mild MR with thickened MV leaflets; mild TR; mild pulm valve regurg   VAGINAL DELIVERY     x3   Patient Active Problem List   Diagnosis Date Noted   Change in stool 07/24/2020   Gastroesophageal reflux disease without esophagitis 02/08/2020   Asthma    Family history of heart disease 11/21/2017   Other fatigue  11/21/2017   Atypical chest pain 06/21/2016   Seizures ([place]) 12/14/2014   Transient memory loss 12/09/2014   Essential hypertension 12/09/2014   Mixed hyperlipidemia 12/09/2014    PCP: [facility]; Adam Phenix, MD REFERRING PROVIDER: Aldean Jewett, MD  REFERRING DIAG: R42 (ICD-10-CM) - Vertigo  THERAPY DIAG:  Dizziness and giddiness  ONSET DATE: about 2 months ago  Rationale for Evaluation and Treatment: Rehabilitation  SUBJECTIVE:  PT states that she has not had any dizziness since the 14th.     SUBJECTIVE STATEMENT: Reports some arthritis in neck Family history of vertigo  PAIN:  Are you having pain? No  PRECAUTIONS: None  WEIGHT BEARING RESTRICTIONS: No  FALLS: Has patient fallen in last 6 months? No  LIVING ENVIRONMENT: Lives with: lives with their spouse Lives in: House/apartment Stairs: Yes: External: 6 steps; on right going up, on left going up, and can reach both Has following equipment at home: Single point cane and Grab bars  PLOF: Independent  PATIENT GOALS: stop having dizziness  OBJECTIVE:  Cervical ROM:  grossly wfl throughout   STRENGTH: bilateral upper extremity and lower extremity AROM and strength grossly wfl throughout  Gait pattern: WFL Distance walked: 80 ft to PT gym Assistive device utilized: None Level of assistance: Complete Independence  Comments: no LOB noted although patient states she is feeling dizzy  PATIENT SURVEYS:  DHI test next visit  VESTIBULAR ASSESSMENT:  GENERAL OBSERVATION: no noted nystagumus at rest   SYMPTOM BEHAVIOR:  Subjective history: reports insidious onset of dizziness  Non-Vestibular symptoms: changes in vision  Type of dizziness: Spinning/Vertigo and Lightheadedness/Faint  Frequency: several times a week  Duration: varies  Aggravating factors: No known aggravating factors  Relieving factors: no known relieving factors  Progression of symptoms: better  OCULOMOTOR EXAM:  Ocular Alignment:  normal  Ocular ROM: No Limitations  Spontaneous Nystagmus: absent  Gaze-Induced Nystagmus: absent  Smooth Pursuits: intact  Saccades:  not tested  Convergence/Divergence: not tested  VESTIBULAR - OCULAR REFLEX:   Slow VOR: Comment: not tested  VOR Cancellation: Comment: not tested  Head-Impulse Test: not tested  Dynamic Visual Acuity:  not tested   POSITIONAL TESTING: Right Dix-Hallpike: no nystagmus Left Dix-Hallpike: no nystagmus  MOTION SENSITIVITY:  Motion Sensitivity Quotient Intensity: 0 = none, 1 = Lightheaded, 2 = Mild, 3 = Moderate, 4 = Severe, 5 = Vomiting  Intensity 08/21/22 12/19  1. Sitting to supine 1    2. Supine to L side     3. Supine to R side     4. Supine to sitting 1    5. L Hallpike-Dix 1 0   6. Up from L  1 0   7. R Hallpike-Dix 1 0   8. Up from R  1 0   9. Sitting, head tipped to L knee 1 0   10. Head up from L knee 1 0   11. Sitting, head tipped to R knee 1 0   12. Head up from R knee 1 0   13. Sitting head turns x5 1 0   14.Sitting head nods x5 1 0   15. In stance, 180 turn to L    0  16. In stance, 180 turn to R   0   OTHOSTATICS: not done  FUNCTIONAL GAIT:  not tested   VESTIBULAR TREATMENT:  09/03/22:  90 degree turns to Rt then Lt x 5 each :  no sx  180 degree turns to Rt then Lt x 5 each :  no sx  Walking with eye gaze no sx Walking with head turns no sx Retro walking no sx SLS x 45 " B Vector stance with head turns x 5 B Vector stance with head nods x 5 B 08/28/22 Standing with small bos head turns and nods x 10 Tandem stance with head nods and turns x 10 each 1/4 turns x 10 each way; no LOB but some complaint of lightheadness  Seated  Head turns x 10 Head nods x 10  08/21/22 Standing head turns (-) Standing vertical nods slight off balance [place] with eyes closed slight off balance 1/4 turn to RT off balance 1/4 turn to LT(-).                                                                                                      DATE: 08/14/2022 evaluation  and HEP instruction  Canalith Repositioning:  Comment: na Gaze Adaptation:na   Habituation:  Brandt-Daroff: number of reps: 5 each side one time a day Other: issued handout  Education details: Patient educated on exam findings, POC, scope of PT, HEP. Person educated: Patient Education method: Explanation, Demonstration, and Handouts Education comprehension: verbalized understanding, returned demonstration, verbal cues required, and tactile cues required  HOME EXERCISE PROGRAM: 09/03/22 SLS x 45 " B Vector stance with head turns x 5 B Vector stance with head nods x 5 B 12/6 Access Code: URL: [email or web address] Date: 08/21/2022 Prepared by: Rayetta Humphrey  Exercises - Standing Head Nod Vestibular Habituation  - 5-6 x daily - 7 x weekly - 1 sets - 5-10 reps - Standing Quarter Turn with Counter Support  - 4 x daily - 7 x weekly - 1 sets - 1 reps - Tandem Stance  - 3 x daily - 7 x weekly - 1 sets - 2 reps - as long as possible  hold  Access Code: [email or web address] URL: [email or web address] Date: 08/14/2022 Prepared by: [facility] - Rehab  Exercises - Brandt-Daroff Vestibular Exercise  - 1 x daily - 7 x weekly - 1 sets - 5 reps   GOALS: Goals reviewed with patient?   SHORT TERM GOALS: Target date: 08/26/2022  Patient will be independent with initial HEP  Baseline: Goal status: MET  2.   Patient will report at least 50% improvement in overall symptoms and/or function to demonstrate improved functional mobility   Baseline:  Goal status: MET   LONG TERM GOALS: Target date: 09/11/2022  Patient will be independent in self management strategies to improve quality of life and functional outcomes.   Baseline:  Goal status: MET  2.  Patient will have 1 week without complaint of dizziness during her daily activity at home to improve efficiency with household tasks.  Baseline:  Goal status: MET  3.  Patient will  improve her score on DHI to demonstrate improved functional mobility/ decreased dizziness with functional tasks. Baseline:  Goal status: REVISED never completed    ASSESSMENT:  CLINICAL IMPRESSION: Pt has not had any sx since last week.  Pt has met all goals.  Therapist gave pt balance and higher vestibular exercises to work on to improve both vestibular and balance.  Pt agrees and is ready for discharge.      OBJECTIVE IMPAIRMENTS: decreased activity tolerance, decreased knowledge of condition, difficulty walking, dizziness, and impaired perceived functional ability.   ACTIVITY LIMITATIONS: carrying, lifting, bending, sitting, standing, squatting, sleeping, stairs, transfers, bed mobility, locomotion level, and caring for others  PARTICIPATION LIMITATIONS: meal prep, cleaning, laundry, medication management, personal finances, interpersonal relationship, driving, shopping, community activity, yard work, and church  REHAB POTENTIAL: Good  CLINICAL DECISION MAKING: Evolving/moderate complexity  EVALUATION COMPLEXITY: Moderate    PLAN:  PT FREQUENCY: 1x/week  PT DURATION: 6 weeks  PLANNED INTERVENTIONS: Therapeutic exercises, Therapeutic activity, Neuromuscular re-education, Balance training, Gait training, Patient/Family education, Self Care, Joint mobilization, Vestibular training, Canalith repositioning, Visual/preceptual remediation/compensation, Spinal manipulation, Spinal mobilization, Cryotherapy, Moist heat, Biofeedback, and Re-evaluation  PLAN FOR NEXT SESSION:Discharge.   Rayetta Humphrey, [place] CLT [phone]  9:14

## 2022-09-11 ENCOUNTER — Encounter (HOSPITAL_COMMUNITY): Payer: Medicare Other

## 2022-09-13 ENCOUNTER — Ambulatory Visit (INDEPENDENT_AMBULATORY_CARE_PROVIDER_SITE_OTHER): Payer: Medicare Other | Admitting: Family Medicine

## 2022-09-13 ENCOUNTER — Encounter: Payer: Self-pay | Admitting: Family Medicine

## 2022-09-13 ENCOUNTER — Ambulatory Visit: Payer: Medicare Other

## 2022-09-13 VITALS — BP 111/71 | HR 66 | Temp 97.6°F | Resp 20 | Ht 66.0 in | Wt 181.0 lb

## 2022-09-13 DIAGNOSIS — R42 Dizziness and giddiness: Secondary | ICD-10-CM | POA: Diagnosis not present

## 2022-09-13 DIAGNOSIS — Z8739 Personal history of other diseases of the musculoskeletal system and connective tissue: Secondary | ICD-10-CM | POA: Diagnosis not present

## 2022-09-13 DIAGNOSIS — K219 Gastro-esophageal reflux disease without esophagitis: Secondary | ICD-10-CM | POA: Diagnosis not present

## 2022-09-13 MED ORDER — ALLOPURINOL 300 MG PO TABS: 300.0000 mg | ORAL_TABLET | Freq: Every evening | ORAL | 3 refills | Status: DC

## 2022-09-13 MED ORDER — ESOMEPRAZOLE MAGNESIUM 20 MG PO CPDR: 20.0000 mg | DELAYED_RELEASE_CAPSULE | Freq: Every day | ORAL | 3 refills | Status: AC

## 2022-09-13 MED ORDER — SERTRALINE HCL 50 MG PO TABS: 50.0000 mg | ORAL_TABLET | Freq: Every day | ORAL | 3 refills | Status: DC

## 2022-09-13 NOTE — Patient Instructions (Signed)
Let me know if that dizziness persists.  We discussed possible MR of your vessels to look for vertebrobasilar insufficiency.

## 2022-09-13 NOTE — Progress Notes (Signed)
Subjective: CC: Follow-up vertigo, med refills PCP: Janora Norlander, DO EXB:MWUXLKG J [name] is a 71 y.o. female presenting to clinic today for:  1.  Vertigo Patient was last seen in October for vertigo.  At that time, I have recommended that she see vestibular rehab and she notes that she has not had any recurrence of the vertigo since.  She does continue to have some intermittent dizziness when she looks down, particularly for prolonged periods of time.  She states that when she was cooking all day she noticed this when she was putting things into the abdomen.  She has not had any other neurologic deficits.  No reports of nausea, vomiting, falls or loss of consciousness.  2.  Gout Patient reports good control of gout with allopurinol.  Needs refills  3.  GERD Patient reports that GERD is controlled with Nexium.  She apparently forgot to replace her Nexium a few days ago and had terrible breakthrough GERD.  She took some Tums and this did help but she has since started taking the over-the-counter Nexium again.  No reports of GI bleeding   ROS: Per HPI  Allergies  Allergen Reactions   Bee Venom Swelling   Crestor [Rosuvastatin] Other (See Comments)    Myalgias   Erythromycin Itching, Rash and Other (See Comments)   Rosuvastatin Calcium Other (See Comments)    Myalgias   Pravastatin Other (See Comments)    Pain in joints.   Zetia [Ezetimibe]    Aleve [Naproxen Sodium]     Gastritis   Tetracyclines & Related Other (See Comments)    unknown   Vytorin [Ezetimibe-Simvastatin] Other (See Comments)    unknown   Past Medical History:  Diagnosis Date   Allergy    Asthma    Back pain    Cancer ([place])    skin cancer   Depression    Hyperlipidemia     Current Outpatient Medications:    albuterol (VENTOLIN HFA) 108 (90 Base) MCG/ACT inhaler, Inhale 2 puffs into the lungs every 6 (six) hours as needed for wheezing or shortness of breath., Disp: 1 each, Rfl: 0   allopurinol  (ZYLOPRIM) 300 MG tablet, Take 1 tablet (300 mg total) by mouth every evening., Disp: 90 tablet, Rfl: 0   Ascorbic Acid (VITAMIN C PO), Take by mouth., Disp: , Rfl:    atenolol (TENORMIN) 50 MG tablet, TAKE 1 TABLET BY MOUTH DAILY, Disp: 100 tablet, Rfl: 2   BIOTIN PO, Take by mouth., Disp: , Rfl:    budesonide (PULMICORT) 180 MCG/ACT inhaler, Inhale 2 puffs into the lungs 2 (two) times daily., Disp: , Rfl:    cetirizine (ZYRTEC) 10 MG tablet, Take 1 tablet by mouth daily., Disp: , Rfl:    cholecalciferol (VITAMIN D3) 25 MCG (1000 UT) tablet, Take 1,000 Units by mouth daily., Disp: , Rfl:    esomeprazole (NEXIUM) 20 MG capsule, Take 20 mg by mouth daily at 12 noon., Disp: , Rfl:    glucose blood (CONTOUR NEXT TEST) test strip, Test BS daily and as needed Dx R73.09, Disp: 100 each, Rfl: 3   Lancets 30G MISC, Test BS daily and as needed Dx R73.09, Disp: 100 each, Rfl: 3   meclizine (ANTIVERT) 25 MG tablet, Take 1 tablet (25 mg total) by mouth 3 (three) times daily as needed for dizziness., Disp: 30 tablet, Rfl: 2   montelukast (SINGULAIR) 10 MG tablet, TAKE 1 TABLET BY MOUTH IN THE  MORNING, Disp: 100 tablet, Rfl: 2  Multiple Vitamin (MULTIVITAMIN) capsule, Take 1 capsule by mouth daily. , Disp: , Rfl:    sertraline (ZOLOFT) 50 MG tablet, Take 1 tablet (50 mg total) by mouth daily., Disp: 30 tablet, Rfl: 5 Social History   Socioeconomic History   Marital status: Married    Spouse name: Not on file   Number of children: Not on file   Years of education: Not on file   Highest education level: Not on file  Occupational History   Not on file  Tobacco Use   Smoking status: Never   Smokeless tobacco: Never  Vaping Use   Vaping Use: Never used  Substance and Sexual Activity   Alcohol use: No   Drug use: No   Sexual activity: Not on file  Other Topics Concern   Not on file  Social History Narrative   Not on file   Social Determinants of Health   Financial Resource Strain: Low Risk   (08/02/2022)   Overall Financial Resource Strain (CARDIA)    Difficulty of Paying Living Expenses: Not hard at all  Food Insecurity: No Food Insecurity (08/02/2022)   Hunger Vital Sign    Worried About Running Out of Food in the Last Year: Never true    [place] in the Last Year: Never true  Transportation Needs: No Transportation Needs (08/02/2022)   PRAPARE - Hydrologist (Medical): No    Lack of Transportation (Non-Medical): No  Physical Activity: Insufficiently Active (08/02/2022)   Exercise Vital Sign    Days of Exercise per Week: 3 days    Minutes of Exercise per Session: 30 min  Stress: No Stress Concern Present (08/02/2022)   [place]    Feeling of Stress : Not at all  Social Connections: [place] (08/02/2022)   Social Connection and Isolation Panel [NHANES]    Frequency of Communication with Friends and Family: More than three times a week    Frequency of Social Gatherings with Friends and Family: More than three times a week    Attends Religious Services: More than 4 times per year    Active Member of [employer] or Organizations: Yes    Attends Music therapist: More than 4 times per year    Marital Status: Married  Human resources officer Violence: Not At Risk (08/02/2022)   Humiliation, Afraid, Rape, and Kick questionnaire    Fear of Current or Ex-Partner: No    Emotionally Abused: No    Physically Abused: No    Sexually Abused: No   Family History  Problem Relation Age of Onset   Heart disease Mother    Kidney disease Mother    Breast cancer Mother    Coronary artery disease Mother        CABG at age 4   Dementia Father    Stroke Father    Stroke Maternal Grandmother    Cancer Maternal Grandfather    Cancer Paternal Grandmother    Kidney disease Paternal Grandfather     Objective: Office vital signs reviewed. BP 111/71   Pulse 66   Temp  97.6 F (36.4 C) (Temporal)   Resp 20   Ht '5\' 6"'$  (1.676 m)   Wt 181 lb (82.1 kg)   SpO2 96%   BMI 29.21 kg/m   Physical Examination:  General: Awake, alert, well nourished, No acute distress HEENT: Sclera white.  PERRLA. Cardio: regular rate and rhythm, S1S2 heard, no murmurs  appreciated Pulm: clear to auscultation bilaterally, no wheezes, rhonchi or rales; normal work of breathing on room air MSK: Ambulating independently with normal gait and station Neuro: No nystagmus.  No tremor.  Alert and oriented.  CN II through XII grossly intact  Assessment/ Plan: 71 y.o. female   Vertigo  History of gout - Plan: allopurinol (ZYLOPRIM) 300 MG tablet  Gastroesophageal reflux disease without esophagitis - Plan: esomeprazole (NEXIUM) 20 MG capsule  Vertigo has resolved but continues to have some dizziness with looking down.  She wants to continue monitoring this but we discussed consideration for MRA to further evaluate if symptoms do not resolve or if they start having other worrisome symptoms or signs.  She was amenable to this  Gout is controlled with allopurinol.  This has been renewed  GERD is chronic and stable with Nexium.  No orders of the defined types were placed in this encounter.  No orders of the defined types were placed in this encounter.    Janora Norlander, DO [place] [phone]

## 2022-09-17 ENCOUNTER — Encounter (HOSPITAL_COMMUNITY): Payer: Medicare Other

## 2022-09-24 ENCOUNTER — Encounter (HOSPITAL_COMMUNITY): Payer: Medicare Other

## 2022-09-28 ENCOUNTER — Encounter (HOSPITAL_COMMUNITY): Payer: Self-pay

## 2022-09-28 ENCOUNTER — Ambulatory Visit (HOSPITAL_COMMUNITY)
Admission: EM | Admit: 2022-09-28 | Discharge: 2022-09-28 | Disposition: A | Discharge status: Home / Self Care | Payer: Medicare Other | Attending: Physician Assistant | Admitting: Physician Assistant

## 2022-09-28 DIAGNOSIS — U071 COVID-19: Secondary | ICD-10-CM | POA: Diagnosis not present

## 2022-09-28 DIAGNOSIS — J069 Acute upper respiratory infection, unspecified: Secondary | ICD-10-CM | POA: Insufficient documentation

## 2022-09-28 LAB — BASIC METABOLIC PANEL
Anion gap: 12 (ref 5–15)
BUN: 15 mg/dL (ref 8–23)
CO2: 24 mmol/L (ref 22–32)
Calcium: 9.3 mg/dL (ref 8.9–10.3)
Chloride: 97 mmol/L — ABNORMAL LOW (ref 98–111)
Creatinine, Ser: 0.93 mg/dL (ref 0.44–1.00)
GFR, Estimated: >60 mL/min (ref >60)
Glucose, Bld: 149 mg/dL — ABNORMAL HIGH (ref 70–99)
Potassium: 4.1 mmol/L (ref 3.5–5.1)
Sodium: 133 mmol/L — ABNORMAL LOW (ref 135–145)

## 2022-09-28 LAB — POC INFLUENZA A AND B ANTIGEN (URGENT CARE ONLY)
INFLUENZA A ANTIGEN, POC: NEGATIVE
INFLUENZA B ANTIGEN, POC: NEGATIVE

## 2022-09-28 NOTE — Discharge Instructions (Signed)
I believe you have a virus.  You tested negative for influenza in clinic today.  We will contact you if you are positive for COVID and consider starting medication such as Paxlovid.  Use over-the-counter medication including Mucinex, Flonase, Tylenol.  Make sure that you rest and drink plenty of fluid.  Use a humidifier in your room as well as nasal saline/sinus rinses for additional symptom relief.  If your symptoms are not improving by next week or if anything worsens you need to be seen immediately.

## 2022-09-28 NOTE — ED Triage Notes (Signed)
Pt is here for sore throat, nasal congestion, runny nose, ear pain, headache , chills, fever, body aches , cough x1day

## 2022-09-28 NOTE — ED Provider Notes (Signed)
[place]    CSN: [id number] Arrival date & time: 09/28/22  1019      History   Chief Complaint Chief Complaint  Patient presents with   Otalgia   Sore Throat    HPI [name] is a 72 y.o. female.   Patient presents today with a 24-hour history of URI symptoms including sore throat, congestion, rhinorrhea, headache, dizziness, chills, fever, body aches.  Denies any chest pain, shortness of breath, nausea, vomiting, diarrhea.  Denies any known sick contacts but did attend a funeral several days ago.  She has had COVID with last episode in 2020.  She has had 1 COVID-vaccine but no recent boosters.  She is up-to-date on influenza vaccine.  She reports a history of asthma but has inhalers available should she require them; has not required any use of rescue medication since symptoms began.  She denies any recent antibiotics or steroids.  Has tried over-the-counter cough/congestion medication without improvement of symptoms.  Denies history of COPD or smoking.    Past Medical History:  Diagnosis Date   Allergy    Asthma    Back pain    Cancer ([place])    skin cancer   Depression    Hyperlipidemia     Patient Active Problem List   Diagnosis Date Noted   Change in stool 07/24/2020   Gastroesophageal reflux disease without esophagitis 02/08/2020   Asthma    Family history of heart disease 11/21/2017   Other fatigue 11/21/2017   Atypical chest pain 06/21/2016   Seizures ([place]) 12/14/2014   Transient memory loss 12/09/2014   Essential hypertension 12/09/2014   Mixed hyperlipidemia 12/09/2014    Past Surgical History:  Procedure Laterality Date   bladder mesh     COLONOSCOPY N/A 07/12/2015   Procedure: COLONOSCOPY;  Surgeon: Rogene Houston, MD;  [facility]: [facility];  Service: Endoscopy;  Laterality: N/A;  [place]  02/2007   dipyridamole myoview; EF 70%, no wall motion abnormalities, no inducible ischemia, low risk    OVARIAN  CYST REMOVAL  1987   TONSILECTOMY/ADENOIDECTOMY WITH MYRINGOTOMY  1958   TONSILLECTOMY     TRANSTHORACIC ECHOCARDIOGRAM  02/2007   RV mildly dlated; LV normal in size; mild MR with thickened MV leaflets; mild TR; mild pulm valve regurg   VAGINAL DELIVERY     x3    OB History   No obstetric history on file.      Home Medications    Prior to Admission medications   Medication Sig Start Date End Date Taking? Authorizing Provider  albuterol (VENTOLIN HFA) 108 (90 Base) MCG/ACT inhaler Inhale 2 puffs into the lungs every 6 (six) hours as needed for wheezing or shortness of breath. 02/12/22   Janora Norlander, DO  allopurinol (ZYLOPRIM) 300 MG tablet Take 1 tablet (300 mg total) by mouth every evening. 09/13/22   Janora Norlander, DO  Ascorbic Acid (VITAMIN C PO) Take by mouth.    [provider]  atenolol (TENORMIN) 50 MG tablet TAKE 1 TABLET BY MOUTH DAILY 04/01/22   Hilty, Nadean Corwin, MD  BIOTIN PO Take by mouth.    [provider]  budesonide (PULMICORT) 180 MCG/ACT inhaler Inhale 2 puffs into the lungs 2 (two) times daily.    [provider]  cetirizine (ZYRTEC) 10 MG tablet Take 1 tablet by mouth daily.    [provider]  cholecalciferol (VITAMIN D3) 25 MCG (1000 UT) tablet Take  1,000 Units by mouth daily.    [provider]  esomeprazole (NEXIUM) 20 MG capsule Take 1 capsule (20 mg total) by mouth daily at 12 noon. For GERD 09/13/22   Ronnie Doss M, DO  glucose blood (CONTOUR NEXT TEST) test strip Test BS daily and as needed Dx R73.09 08/18/20   Janora Norlander, DO  Lancets 30G MISC Test BS daily and as needed Dx R73.09 08/18/20   Janora Norlander, DO  meclizine (ANTIVERT) 25 MG tablet Take 1 tablet (25 mg total) by mouth 3 (three) times daily as needed for dizziness. 07/19/22   Ronnie Doss M, DO  montelukast (SINGULAIR) 10 MG tablet TAKE 1 TABLET BY MOUTH IN THE  MORNING 04/22/22   Dettinger, Fransisca Kaufmann, MD  Multiple  Vitamin (MULTIVITAMIN) capsule Take 1 capsule by mouth daily.     [provider]  sertraline (ZOLOFT) 50 MG tablet Take 1 tablet (50 mg total) by mouth daily. Put on file 09/13/22   Janora Norlander, DO    Family History Family History  Problem Relation Age of Onset   Heart disease Mother    Kidney disease Mother    Breast cancer Mother    Coronary artery disease Mother        CABG at age 36   Dementia Father    Stroke Father    Stroke Maternal Grandmother    Cancer Maternal Grandfather    Cancer Paternal Grandmother    Kidney disease Paternal Grandfather     Social History Social History   Tobacco Use   Smoking status: Never   Smokeless tobacco: Never  Vaping Use   Vaping Use: Never used  Substance Use Topics   Alcohol use: No   Drug use: No     Allergies   Bee venom, Crestor [rosuvastatin], Erythromycin, Rosuvastatin calcium, Pravastatin, Zetia [ezetimibe], Aleve [naproxen sodium], Tetracyclines & related, and Vytorin [ezetimibe-simvastatin]   Review of Systems Review of Systems  Constitutional:  Positive for activity change, fatigue and fever. Negative for appetite change.  HENT:  Positive for congestion, postnasal drip and sore throat. Negative for sinus pressure and sneezing.   Respiratory:  Positive for cough. Negative for shortness of breath.   Cardiovascular:  Negative for chest pain.  Gastrointestinal:  Negative for abdominal pain, diarrhea, nausea and vomiting.  Musculoskeletal:  Positive for arthralgias and myalgias.  Neurological:  Positive for headaches. Negative for dizziness and light-headedness.     Physical Exam Triage Vital Signs ED Triage Vitals  Enc Vitals Group     BP 09/28/22 1100 124/85     Pulse Rate 09/28/22 1100 88     Resp 09/28/22 1100 16     Temp 09/28/22 1100 98.1 F (36.7 C)     Temp Source 09/28/22 1100 Oral     SpO2 09/28/22 1100 96 %     Weight --      Height --      Head Circumference --      Peak Flow --       Pain Score 09/28/22 1058 3     Pain Loc --      Pain Edu? --      Excl. in [place]? --    No data found.  Updated Vital Signs BP 124/85 (BP Location: Left Arm)   Pulse 88   Temp 98.1 F (36.7 C) (Oral)   Resp 16   SpO2 96%   Visual Acuity Right Eye Distance:   Left Eye Distance:  Bilateral Distance:    Right Eye Near:   Left Eye Near:    Bilateral Near:     Physical Exam Vitals reviewed.  Constitutional:      General: She is awake. She is not in acute distress.    Appearance: Normal appearance. She is well-developed. She is not ill-appearing.     Comments: Very pleasant female appears stated age in no acute distress sitting comfortably in exam room resting her head on the exam room table  HENT:     Head: Normocephalic and atraumatic.     Right Ear: Tympanic membrane, ear canal and external ear normal. Tympanic membrane is not erythematous or bulging.     Left Ear: Tympanic membrane, ear canal and external ear normal. Tympanic membrane is not erythematous or bulging.     Nose:     Right Sinus: No maxillary sinus tenderness or frontal sinus tenderness.     Left Sinus: No maxillary sinus tenderness or frontal sinus tenderness.     Mouth/Throat:     Pharynx: Uvula midline. No oropharyngeal exudate or posterior oropharyngeal erythema.  Cardiovascular:     Rate and Rhythm: Normal rate and regular rhythm.     Heart sounds: Normal heart sounds, S1 normal and S2 normal. No murmur heard. Pulmonary:     Effort: Pulmonary effort is normal.     Breath sounds: Normal breath sounds. No wheezing, rhonchi or rales.     Comments: Clear to auscultation bilaterally Psychiatric:        Behavior: Behavior is cooperative.      UC Treatments / Results  Labs (all labs ordered are listed, but only abnormal results are displayed) Labs Reviewed  SARS CORONAVIRUS 2 (TAT 6-24 HRS)  BASIC METABOLIC PANEL  POC INFLUENZA A AND B ANTIGEN (URGENT CARE ONLY)    EKG   Radiology No  results found.  Procedures Procedures (including critical care time)  Medications Ordered in UC Medications - No data to display  Initial Impression / Assessment and Plan / UC Course  I have reviewed the triage vital signs and the nursing notes.  Pertinent labs & imaging results that were available during my care of the patient were reviewed by me and considered in my medical decision making (see chart for details).     Patient presents today and is well-appearing, afebrile, nontoxic, nontachycardic.  Flu testing was negative in clinic.  COVID testing is pending.  Patient is a candidate for Paxlovid given her age.  We do not have a recent metabolic panel on file so this was obtained in order to monitor her EGFR.  She does not require any medication adjustments for Paxlovid.  Offered prescription cough and congestion medication which she declined as she has this at home.  She was encouraged to use conservative treatment measures including humidifier, nasal saline, sinus rinses for symptom relief.  Discussed that if she is not improving within a week she is to return for reevaluation.  If she has any worsening symptoms including chest pain, shortness of breath, fever, nausea/vomiting interfering with oral intake, weakness she needs to be seen immediately.  Strict return precautions given.  Work excuse note provided.  Final Clinical Impressions(s) / UC Diagnoses   Final diagnoses:  Upper respiratory tract infection, unspecified type     Discharge Instructions      I believe you have a virus.  You tested negative for influenza in clinic today.  We will contact you if you are positive for COVID and consider  starting medication such as Paxlovid.  Use over-the-counter medication including Mucinex, Flonase, Tylenol.  Make sure that you rest and drink plenty of fluid.  Use a humidifier in your room as well as nasal saline/sinus rinses for additional symptom relief.  If your symptoms are not  improving by next week or if anything worsens you need to be seen immediately.     ED Prescriptions   None    PDMP not reviewed this encounter.   Terrilee Croak, PA-C 09/28/22 1220

## 2022-09-29 ENCOUNTER — Telehealth: Payer: Self-pay | Admitting: Physician Assistant

## 2022-09-29 LAB — SARS CORONAVIRUS 2 (TAT 6-24 HRS): SARS Coronavirus 2: POSITIVE — AB

## 2022-09-29 MED ORDER — NIRMATRELVIR/RITONAVIR (PAXLOVID)TABLET: 3.0000 | ORAL_TABLET | Freq: Two times a day (BID) | ORAL | 0 refills | Status: AC

## 2022-09-29 NOTE — Telephone Encounter (Signed)
Patient tested positive for positive for COVID-19.  Paxlovid was sent to pharmacy.

## 2022-10-17 DIAGNOSIS — M9901 Segmental and somatic dysfunction of cervical region: Secondary | ICD-10-CM | POA: Diagnosis not present

## 2022-10-17 DIAGNOSIS — M9903 Segmental and somatic dysfunction of lumbar region: Secondary | ICD-10-CM | POA: Diagnosis not present

## 2022-10-17 DIAGNOSIS — M9902 Segmental and somatic dysfunction of thoracic region: Secondary | ICD-10-CM | POA: Diagnosis not present

## 2022-10-17 DIAGNOSIS — M62830 Muscle spasm of back: Secondary | ICD-10-CM | POA: Diagnosis not present

## 2022-10-23 ENCOUNTER — Other Ambulatory Visit: Payer: Self-pay | Admitting: Family Medicine

## 2022-10-23 DIAGNOSIS — Z8739 Personal history of other diseases of the musculoskeletal system and connective tissue: Secondary | ICD-10-CM

## 2022-10-31 DIAGNOSIS — M9903 Segmental and somatic dysfunction of lumbar region: Secondary | ICD-10-CM | POA: Diagnosis not present

## 2022-10-31 DIAGNOSIS — M9901 Segmental and somatic dysfunction of cervical region: Secondary | ICD-10-CM | POA: Diagnosis not present

## 2022-10-31 DIAGNOSIS — M9902 Segmental and somatic dysfunction of thoracic region: Secondary | ICD-10-CM | POA: Diagnosis not present

## 2022-10-31 DIAGNOSIS — M62830 Muscle spasm of back: Secondary | ICD-10-CM | POA: Diagnosis not present

## 2022-11-06 DIAGNOSIS — M9901 Segmental and somatic dysfunction of cervical region: Secondary | ICD-10-CM | POA: Diagnosis not present

## 2022-11-06 DIAGNOSIS — M9902 Segmental and somatic dysfunction of thoracic region: Secondary | ICD-10-CM | POA: Diagnosis not present

## 2022-11-06 DIAGNOSIS — M62830 Muscle spasm of back: Secondary | ICD-10-CM | POA: Diagnosis not present

## 2022-11-06 DIAGNOSIS — M9903 Segmental and somatic dysfunction of lumbar region: Secondary | ICD-10-CM | POA: Diagnosis not present

## 2022-11-29 ENCOUNTER — Other Ambulatory Visit (HOSPITAL_BASED_OUTPATIENT_CLINIC_OR_DEPARTMENT_OTHER): Payer: Self-pay | Admitting: General Practice

## 2022-11-29 DIAGNOSIS — E782 Mixed hyperlipidemia: Secondary | ICD-10-CM | POA: Diagnosis not present

## 2022-11-29 DIAGNOSIS — I700 Atherosclerosis of aorta: Secondary | ICD-10-CM | POA: Diagnosis not present

## 2022-11-29 DIAGNOSIS — I10 Essential (primary) hypertension: Secondary | ICD-10-CM | POA: Diagnosis not present

## 2022-11-30 LAB — LIPID PANEL
Chol/HDL Ratio: 4.8 ratio — ABNORMAL HIGH (ref 0.0–4.4)
Cholesterol, Total: 239 mg/dL — ABNORMAL HIGH (ref 100–199)
HDL: 50 mg/dL (ref >39)
LDL Chol Calc (NIH): 163 mg/dL — ABNORMAL HIGH (ref 0–99)
Triglycerides: 145 mg/dL (ref 0–149)
VLDL Cholesterol Cal: 26 mg/dL (ref 5–40)

## 2022-12-03 DIAGNOSIS — Z85828 Personal history of other malignant neoplasm of skin: Secondary | ICD-10-CM | POA: Diagnosis not present

## 2022-12-03 DIAGNOSIS — L570 Actinic keratosis: Secondary | ICD-10-CM | POA: Diagnosis not present

## 2022-12-06 ENCOUNTER — Ambulatory Visit: Payer: Medicare Other | Admitting: Internal Medicine

## 2023-01-03 ENCOUNTER — Other Ambulatory Visit: Payer: Self-pay | Admitting: Internal Medicine

## 2023-01-05 NOTE — Progress Notes (Unsigned)
Cardiology Clinic Note   Patient Name: [name] Date of Encounter: 01/07/2023  Primary Care Provider:  Raliegh Ip, DO Primary Cardiologist:  Chrystie Nose, MD  Patient Profile    [name] 72 year old female presents the clinic today for follow-up evaluation of her essential hypertension and HLD.  Past Medical History    Past Medical History:  Diagnosis Date   Allergy    Asthma    Back pain    Cancer    skin cancer   Depression    Hyperlipidemia    Past Surgical History:  Procedure Laterality Date   bladder mesh     COLONOSCOPY N/A 07/12/2015   Procedure: COLONOSCOPY;  Surgeon: Malissa Hippo, MD;  [facility];  Service: Endoscopy;  Laterality: N/A;  1030   NM MYOCAR PERF WALL MOTION  02/2007   dipyridamole myoview; EF 70%, no wall motion abnormalities, no inducible ischemia, low risk    OVARIAN CYST REMOVAL  1987   TONSILECTOMY/ADENOIDECTOMY WITH MYRINGOTOMY  1958   TONSILLECTOMY     TRANSTHORACIC ECHOCARDIOGRAM  02/2007   RV mildly dlated; LV normal in size; mild MR with thickened MV leaflets; mild TR; mild pulm valve regurg   VAGINAL DELIVERY     x3    Allergies  Allergies  Allergen Reactions   Bee Venom Swelling   Crestor [Rosuvastatin] Other (See Comments)    Myalgias   Erythromycin Itching, Rash and Other (See Comments)   Rosuvastatin Calcium Other (See Comments)    Myalgias   Pravastatin Other (See Comments)    Pain in joints.   Zetia [Ezetimibe]    Aleve [Naproxen Sodium]     Gastritis   Tetracyclines & Related Other (See Comments)    unknown   Vytorin [Ezetimibe-Simvastatin] Other (See Comments)    unknown    History of Present Illness    [name] has a PMH of HTN, asthma, GERD, transient memory loss, HLD, atypical chest pain, fatigue, palpitations, and family history of heart disease.  She is a former patient of Dr. Lavonne Chick.  She was last seen by Dr. Rennis Golden on 05/29/2021.  During that time she was  stable from a cardiac standpoint.  She was noted to be statin intolerant.  Her cholesterol total was 253 with an LDL cholesterol 165.  She was noted to have no coronary calcium in 2019.  She wished to continue to work on her diet and physical activity.  She was recently retired.  Treatment with red yeast rice was discussed.  Ezetimibe was previously not tolerated well.  Her EKG showed normal sinus rhythm.  She was previously noted to have trouble with dizziness and was found to be hypokalemic.  Her chlorthalidone was discontinued.  Her atenolol was continued.  She presented to the clinic 11/26/21 for follow-up evaluation stated she felt well.  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  She did not feel that she hydrated as well during that time.  She had not had any further episodes of dizziness.  We reviewed her lipid panel from 11/20/2021.  She expressed understanding.  She  had slight improvement in her LDL to 144.  She reported that she had been improving her diet but had not increased physical activity as much as she felt she could.  We reviewed Praluent, Repatha, and inclisiran.  She did not wish to start any other medications at that  time.  She wished to continue to work on her diet and exercise.  She reported that she did not trust new medications.  We planned follow-up for 6 months.  She presents to the clinic today for follow-up evaluation and states she had an episode of headache 2 weeks ago.  She did note some dizziness with the episode.  She felt this was related to migraine.  She did note increased blood pressure at that time.  However, her blood pressure has normalized.  She has had no further episodes of headache.  She continues to do intensive yard work around her house which includes moving heavy loads of leaves.  We reviewed her lipid panel and she expressed understanding.  We reviewed PCSK9 inhibitor and her previous coronary scoring of 0.   Her main complaint today relates to lower extremity weakness.  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  She has palpable lower extremity pulses.  I encouraged increased lower extremity strengthening activities and exercise as well as mobility movements.  Will plan follow-up in 12 months.  I will repeat fasting lipids in 1 year.  Today she denies chest pain, shortness of breath, lower extremity edema, fatigue, palpitations, melena, hematuria, hemoptysis, diaphoresis, weakness, presyncope, syncope, orthopnea, and PND.   Home Medications    Prior to Admission medications   Medication Sig Start Date End Date Taking? Authorizing Provider  albuterol (VENTOLIN HFA) 108 (90 Base) MCG/ACT inhaler Inhale 2 puffs into the lungs every 6 (six) hours as needed for wheezing or shortness of breath. 09/11/21   Raliegh Ip, DO  allopurinol (ZYLOPRIM) 300 MG tablet Take 1 tablet (300 mg total) by mouth every evening. 06/11/21   Delynn Flavin M, DO  Ascorbic Acid (VITAMIN C PO) Take by mouth.    [provider]  atenolol (TENORMIN) 50 MG tablet Take 1 tablet (50 mg total) by mouth daily. 05/22/21 05/22/22  Hilty, Lisette Abu, MD  benzonatate (TESSALON PERLES) 100 MG capsule Take 1 capsule (100 mg total) by mouth 3 (three) times daily as needed. 09/11/21   Delynn Flavin M, DO  BIOTIN PO Take by mouth.    [provider]  budesonide (PULMICORT) 180 MCG/ACT inhaler Inhale 2 puffs into the lungs 2 (two) times daily.    [provider]  cefdinir (OMNICEF) 300 MG capsule Take 1 capsule (300 mg total) by mouth 2 (two) times daily. 1 po BID 09/11/21   Delynn Flavin M, DO  cetirizine (ZYRTEC) 10 MG tablet Take 1 tablet by mouth daily.    [provider]  cholecalciferol (VITAMIN D3) 25 MCG (1000 UT) tablet Take 1,000 Units by mouth daily.    [provider]  esomeprazole (NEXIUM) 20 MG capsule Take 20 mg by mouth daily at 12 noon.    [provider]  glucose blood (CONTOUR NEXT TEST) test strip Test BS daily and as needed Dx R73.09 08/18/20   Raliegh Ip, DO  Lancets 30G MISC Test BS daily and as needed Dx R73.09 08/18/20   Delynn Flavin M, DO  MAGNESIUM PO Take by mouth.    [provider]  montelukast (SINGULAIR) 10 MG tablet Take 1 tablet (10 mg total) by mouth in the morning. 09/19/21   Raliegh Ip, DO  Multiple Vitamin (MULTIVITAMIN) capsule Take 1 capsule by mouth daily.     [provider]  potassium chloride SA (KLOR-CON) 20 MEQ tablet Take 2 tablets (40 mEq total) by mouth 2 (  two) times daily for 10 days. Patient not taking: Reported on 07/31/2021 04/11/21 04/21/21  Junie Spencer, FNP  sertraline (ZOLOFT) 50 MG tablet Take 1 tablet (50 mg total) by mouth daily. 03/26/21   Drema Dallas, DO    Family History    Family History  Problem Relation Age of Onset   Heart disease Mother    Kidney disease Mother    Breast cancer Mother    Coronary artery disease Mother        CABG at age 75   Dementia Father    Stroke Father    Stroke Maternal Grandmother    Cancer Maternal Grandfather    Cancer Paternal Grandmother    Kidney disease Paternal Grandfather    She indicated that her mother is deceased. She indicated that her father is deceased. She indicated that her maternal grandmother is deceased. She indicated that her maternal grandfather is deceased. She indicated that her paternal grandmother is deceased. She indicated that her paternal grandfather is deceased.  Social History    Social History   Socioeconomic History   Marital status: Married    Spouse name: Not on file   Number of children: Not on file   Years of education: Not on file   Highest education level: Not on file  Occupational History   Not on file  Tobacco Use   Smoking status: Never   Smokeless tobacco: Never  Vaping Use   Vaping Use: Never used  Substance and Sexual Activity   Alcohol use: No    Drug use: No   Sexual activity: Not on file  Other Topics Concern   Not on file  Social History Narrative   Not on file   Social Determinants of Health   Financial Resource Strain: Low Risk  (08/02/2022)   Overall Financial Resource Strain (CARDIA)    Difficulty of Paying Living Expenses: Not hard at all  Food Insecurity: No Food Insecurity (08/02/2022)   Hunger Vital Sign    Worried About Running Out of Food in the Last Year: Never true    Ran Out of Food in the Last Year: Never true  Transportation Needs: No Transportation Needs (08/02/2022)   PRAPARE - Administrator, Civil Service (Medical): No    Lack of Transportation (Non-Medical): No  Physical Activity: Insufficiently Active (08/02/2022)   Exercise Vital Sign    Days of Exercise per Week: 3 days    Minutes of Exercise per Session: 30 min  Stress: No Stress Concern Present (08/02/2022)   [employer] of Occupational Health - Occupational Stress Questionnaire    Feeling of Stress : Not at all  Social Connections: Socially Integrated (08/02/2022)   Social Connection and Isolation Panel [NHANES]    Frequency of Communication with Friends and Family: More than three times a week    Frequency of Social Gatherings with Friends and Family: More than three times a week    Attends Religious Services: More than 4 times per year    Active Member of [employer] or Organizations: Yes    Attends Banker Meetings: More than 4 times per year    Marital Status: Married  Catering manager Violence: Not At Risk (08/02/2022)   Humiliation, Afraid, Rape, and Kick questionnaire    Fear of Current or Ex-Partner: No    Emotionally Abused: No    Physically Abused: No    Sexually Abused: No     Review of Systems    General:  No chills, fever, night sweats or weight changes.  Cardiovascular:  No chest pain, dyspnea on exertion, edema, orthopnea, palpitations, paroxysmal nocturnal dyspnea. Dermatological: No rash,  lesions/masses Respiratory: No cough, dyspnea Urologic: No hematuria, dysuria Abdominal:   No nausea, vomiting, diarrhea, bright red blood per rectum, melena, or hematemesis Neurologic:  No visual changes, wkns, changes in mental status. All other systems reviewed and are otherwise negative except as noted above.  Physical Exam    VS:  BP 132/76 (BP Location: Left Arm, Patient Position: Sitting, Cuff Size: Normal)   Pulse 64   Ht  (1.676 m)   Wt 184 lb (83.5 kg)   BMI 29.70 kg/m  , BMI Body mass index is 29.7 kg/m. GEN: Well nourished, well developed, in no acute distress. HEENT: normal. Neck: Supple, no JVD, carotid bruits, or masses. Cardiac: RRR, no murmurs, rubs, or gallops. No clubbing, cyanosis, edema.  Radials/DP/PT 2+ and equal bilaterally.  Respiratory:  Respirations regular and unlabored, clear to auscultation bilaterally. GI: Soft, nontender, nondistended, BS + x 4. MS: no deformity or atrophy. Skin: warm and dry, no rash. Neuro:  Strength and sensation are intact. Psych: Normal affect.  Accessory Clinical Findings    Recent Labs: 09/28/2022: BUN 15; Creatinine, Ser 0.93; Potassium 4.1; Sodium 133   Recent Lipid Panel    Component Value Date/Time   CHOL 239 (H) 11/29/2022 0940   TRIG 145 11/29/2022 0940   HDL 50 11/29/2022 0940   CHOLHDL 4.8 (H) 11/29/2022 0940   LDLCALC 163 (H) 11/29/2022 0940    ECG personally reviewed by me today- none today.  Echo 12/15/14 Study Conclusions   - Left ventricle: The cavity size was normal. Systolic function was    normal. The estimated ejection fraction was in the range of 60%    to 65%. Wall motion was normal; there were no regional wall    motion abnormalities. Doppler parameters are consistent with    abnormal left ventricular relaxation (grade 1 diastolic    dysfunction). Doppler parameters are consistent with high    ventricular filling pressure.  - Pulmonary arteries: PA peak pressure: 36 mm Hg (S).    Impressions:   - No cardiac source of emboli was indentified.   Assessment & Plan   1.  Essential hypertension-BP today 132/76. Continue atenolol Heart healthy low-sodium diet Increase physical activity as tolerated Maintain blood pressure log  Palpitations-no recent episodes of irregular or accelerated heartbeat. Continue atenolol Heart healthy low-sodium diet-salty 6 given Increase physical activity as tolerated Avoid triggers caffeine, chocolate, EtOH, dehydration etc.  Hyperlipidemia-LDL 163 on 11/29/2022.  Intolerant of statins and ezetimibe.  Again reviewed options of Praluent, Repatha, inclisiran.  She wishes to continue to work on diet and exercise at this time.   Heart healthy low-sodium high-fiber diet.   Increase physical activity as tolerated-goal 150 minutes of moderate physical activity per week.   Aortic atherosclerosis-noted on 02/23/2019 chest CT.  Coronary calcium score 0 on 3/19. Heart healthy low-sodium high-fiber diet Increase physical activity as tolerated No plans for ischemic evaluation  Lower extremity weakness-notices with trying to get up from ground position. Increase lower extremity strengthening activities and maintain mobility.  Disposition: Follow-up with Dr. Rennis Golden or me in 9-12 months.    Thomasene Ripple. Mallori Araque NP-C    01/07/2023, 9:54 AM [facility] [address] Office [phone] Fax [phone]  Notice: This dictation was prepared with Dragon dictation along with smaller phrase technology. Any transcriptional errors that  result from this process are unintentional and may not be corrected upon review.  I spent 14 minutes examining this patient, reviewing medications, and using patient centered shared decision making involving her cardiac care.  Prior to her visit I spent greater than 20 minutes reviewing her past medical history,  medications, and prior cardiac tests.

## 2023-01-07 ENCOUNTER — Ambulatory Visit: Payer: Medicare Other | Attending: Internal Medicine | Admitting: General Practice

## 2023-01-07 ENCOUNTER — Encounter: Payer: Self-pay | Admitting: General Practice

## 2023-01-07 VITALS — BP 132/76 | HR 64 | Ht 66.0 in | Wt 184.0 lb

## 2023-01-07 DIAGNOSIS — R29898 Other symptoms and signs involving the musculoskeletal system: Secondary | ICD-10-CM

## 2023-01-07 DIAGNOSIS — I10 Essential (primary) hypertension: Secondary | ICD-10-CM

## 2023-01-07 DIAGNOSIS — E782 Mixed hyperlipidemia: Secondary | ICD-10-CM | POA: Diagnosis not present

## 2023-01-07 DIAGNOSIS — R002 Palpitations: Secondary | ICD-10-CM | POA: Diagnosis not present

## 2023-01-07 DIAGNOSIS — I700 Atherosclerosis of aorta: Secondary | ICD-10-CM

## 2023-01-07 NOTE — Patient Instructions (Signed)
Medication Instructions:  The current medical regimen is effective;  continue present plan and medications as directed. Please refer to the Current Medication list given to you today.  *If you need a refill on your cardiac medications before your next appointment, please call your pharmacy*  Lab Work: FASTING LIPID IN 1 YEAR If you have labs (blood work) drawn today and your tests are completely normal, you will receive your results only by:  MyChart Message (if you have MyChart) OR  A paper copy in the mail If you have any lab test that is abnormal or we need to change your treatment, we will call you to review the results.  Other Instructions INCREASING LOWER EXTREMITY ACTIVITY   Follow-Up: At [facility], you and your health needs are our priority.  As part of our continuing mission to provide you with exceptional heart care, we have created designated Provider Care Teams.  These Care Teams include your primary Cardiologist (physician) and Advanced Practice Providers (APPs -  Physician Assistants and Nurse Practitioners) who all work together to provide you with the care you need, when you need it.  Your next appointment:   12 month(s)  Provider:   Chrystie Nose, MD

## 2023-01-30 ENCOUNTER — Other Ambulatory Visit: Payer: Self-pay | Admitting: Family Medicine

## 2023-02-19 DIAGNOSIS — M9901 Segmental and somatic dysfunction of cervical region: Secondary | ICD-10-CM | POA: Diagnosis not present

## 2023-02-19 DIAGNOSIS — M9903 Segmental and somatic dysfunction of lumbar region: Secondary | ICD-10-CM | POA: Diagnosis not present

## 2023-02-19 DIAGNOSIS — M62830 Muscle spasm of back: Secondary | ICD-10-CM | POA: Diagnosis not present

## 2023-02-19 DIAGNOSIS — M9902 Segmental and somatic dysfunction of thoracic region: Secondary | ICD-10-CM | POA: Diagnosis not present

## 2023-02-20 DIAGNOSIS — M9903 Segmental and somatic dysfunction of lumbar region: Secondary | ICD-10-CM | POA: Diagnosis not present

## 2023-02-20 DIAGNOSIS — M62830 Muscle spasm of back: Secondary | ICD-10-CM | POA: Diagnosis not present

## 2023-02-20 DIAGNOSIS — M9901 Segmental and somatic dysfunction of cervical region: Secondary | ICD-10-CM | POA: Diagnosis not present

## 2023-02-20 DIAGNOSIS — M9902 Segmental and somatic dysfunction of thoracic region: Secondary | ICD-10-CM | POA: Diagnosis not present

## 2023-02-24 DIAGNOSIS — M9902 Segmental and somatic dysfunction of thoracic region: Secondary | ICD-10-CM | POA: Diagnosis not present

## 2023-02-24 DIAGNOSIS — M62830 Muscle spasm of back: Secondary | ICD-10-CM | POA: Diagnosis not present

## 2023-02-24 DIAGNOSIS — M9901 Segmental and somatic dysfunction of cervical region: Secondary | ICD-10-CM | POA: Diagnosis not present

## 2023-02-24 DIAGNOSIS — M9903 Segmental and somatic dysfunction of lumbar region: Secondary | ICD-10-CM | POA: Diagnosis not present

## 2023-02-26 DIAGNOSIS — M9903 Segmental and somatic dysfunction of lumbar region: Secondary | ICD-10-CM | POA: Diagnosis not present

## 2023-02-26 DIAGNOSIS — M62830 Muscle spasm of back: Secondary | ICD-10-CM | POA: Diagnosis not present

## 2023-02-26 DIAGNOSIS — M9901 Segmental and somatic dysfunction of cervical region: Secondary | ICD-10-CM | POA: Diagnosis not present

## 2023-02-26 DIAGNOSIS — M9902 Segmental and somatic dysfunction of thoracic region: Secondary | ICD-10-CM | POA: Diagnosis not present

## 2023-03-13 DIAGNOSIS — M9902 Segmental and somatic dysfunction of thoracic region: Secondary | ICD-10-CM | POA: Diagnosis not present

## 2023-03-13 DIAGNOSIS — M62830 Muscle spasm of back: Secondary | ICD-10-CM | POA: Diagnosis not present

## 2023-03-13 DIAGNOSIS — M9901 Segmental and somatic dysfunction of cervical region: Secondary | ICD-10-CM | POA: Diagnosis not present

## 2023-03-13 DIAGNOSIS — M9903 Segmental and somatic dysfunction of lumbar region: Secondary | ICD-10-CM | POA: Diagnosis not present

## 2023-03-17 DIAGNOSIS — M9903 Segmental and somatic dysfunction of lumbar region: Secondary | ICD-10-CM | POA: Diagnosis not present

## 2023-03-17 DIAGNOSIS — M9901 Segmental and somatic dysfunction of cervical region: Secondary | ICD-10-CM | POA: Diagnosis not present

## 2023-03-17 DIAGNOSIS — M9902 Segmental and somatic dysfunction of thoracic region: Secondary | ICD-10-CM | POA: Diagnosis not present

## 2023-03-17 DIAGNOSIS — M62830 Muscle spasm of back: Secondary | ICD-10-CM | POA: Diagnosis not present

## 2023-03-19 ENCOUNTER — Ambulatory Visit (INDEPENDENT_AMBULATORY_CARE_PROVIDER_SITE_OTHER): Payer: Medicare Other | Admitting: Family Medicine

## 2023-03-19 ENCOUNTER — Encounter: Payer: Self-pay | Admitting: Family Medicine

## 2023-03-19 VITALS — BP 136/71 | HR 72 | Temp 97.8°F | Ht 66.0 in | Wt 183.6 lb

## 2023-03-19 DIAGNOSIS — R7401 Elevation of levels of liver transaminase levels: Secondary | ICD-10-CM | POA: Diagnosis not present

## 2023-03-19 DIAGNOSIS — R739 Hyperglycemia, unspecified: Secondary | ICD-10-CM | POA: Diagnosis not present

## 2023-03-19 DIAGNOSIS — R1013 Epigastric pain: Secondary | ICD-10-CM

## 2023-03-19 DIAGNOSIS — R748 Abnormal levels of other serum enzymes: Secondary | ICD-10-CM | POA: Diagnosis not present

## 2023-03-19 DIAGNOSIS — R1011 Right upper quadrant pain: Secondary | ICD-10-CM

## 2023-03-19 DIAGNOSIS — M9903 Segmental and somatic dysfunction of lumbar region: Secondary | ICD-10-CM | POA: Diagnosis not present

## 2023-03-19 DIAGNOSIS — M9902 Segmental and somatic dysfunction of thoracic region: Secondary | ICD-10-CM | POA: Diagnosis not present

## 2023-03-19 DIAGNOSIS — M62830 Muscle spasm of back: Secondary | ICD-10-CM | POA: Diagnosis not present

## 2023-03-19 DIAGNOSIS — M9901 Segmental and somatic dysfunction of cervical region: Secondary | ICD-10-CM | POA: Diagnosis not present

## 2023-03-19 LAB — MICROSCOPIC EXAMINATION
RBC, Urine: NONE SEEN /hpf (ref 0–2)
Renal Epithel, UA: NONE SEEN /hpf

## 2023-03-19 LAB — URINALYSIS, ROUTINE W REFLEX MICROSCOPIC
Bilirubin, UA: NEGATIVE
Glucose, UA: NEGATIVE
Leukocytes,UA: NEGATIVE
Nitrite, UA: NEGATIVE
Protein,UA: NEGATIVE
RBC, UA: NEGATIVE
Specific Gravity, UA: 1.025 (ref 1.005–1.030)
Urobilinogen, Ur: 0.2 mg/dL (ref 0.2–1.0)
pH, UA: 5.5 (ref 5.0–7.5)

## 2023-03-19 MED ORDER — FAMOTIDINE 20 MG PO TABS: 20.0000 mg | ORAL_TABLET | Freq: Two times a day (BID) | ORAL | 0 refills | Status: DC

## 2023-03-19 NOTE — Progress Notes (Signed)
Subjective:  Patient ID: [name], female    DOB: 20-Sep-1950, 72 y.o.   MRN: [id number]  Patient Care Team: Raliegh Ip, DO as PCP - General (Family Medicine) Rennis Golden Lisette Abu, MD as PCP - Cardiology (Cardiology)   Chief Complaint:  Abdominal Pain (UPPER AND LOWER), Bloated, and Gas   HPI: [name] is a 72 y.o. female presenting on 03/19/2023 for Abdominal Pain (UPPER AND LOWER), Bloated, and Gas   Pt presents today with complaints of abdominal pain that feels like pressure and bloating with increased gas. No nausea, vomiting, diarrhea, fever, chills, weakness, confusion, dysuria, or urinary symptoms. Has been taking Nexium as prescribed without relief of symptoms. Eating does worsen the pain. Nothing seems to relieve the pain. States her bowel habits are normal, has a normal stool 2-3 times per day.   Abdominal Pain This is a new problem. Episode onset: 2 weeks ago. The problem occurs intermittently. The problem has been waxing and waning. The pain is located in the epigastric region and periumbilical region. The pain is moderate. The quality of the pain is cramping, a sensation of fullness, sharp and aching. The abdominal pain radiates to the RUQ. Associated symptoms include anorexia and belching. Pertinent negatives include no arthralgias, constipation, diarrhea, dysuria, fever, flatus, frequency, headaches, hematochezia, hematuria, melena, myalgias, nausea, vomiting or weight loss. The pain is aggravated by eating. The pain is relieved by Nothing. She has tried proton pump inhibitors for the symptoms. The treatment provided no relief.    Relevant past medical, surgical, family, and social history reviewed and updated as indicated.  Allergies and medications reviewed and updated. Data reviewed: Chart in Epic.   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%, no wall motion abnormalities, no inducible ischemia, low risk    OVARIAN CYST REMOVAL  1987   TONSILECTOMY/ADENOIDECTOMY WITH MYRINGOTOMY  1958   TONSILLECTOMY     TRANSTHORACIC ECHOCARDIOGRAM  02/2007   RV mildly dlated; LV normal in size; mild MR with thickened MV leaflets; mild TR; mild pulm valve regurg   VAGINAL DELIVERY     x3    Social History   Socioeconomic History   Marital status: Married    Spouse name: Not on file   Number of children: Not on file   Years of education: Not on file   Highest education level: Not on file  Occupational History   Not on file  Tobacco Use   Smoking status: Never   Smokeless tobacco: Never  Vaping Use   Vaping Use: Never used  Substance and Sexual Activity   Alcohol use: No   Drug use: No   Sexual activity: Not on file  Other Topics Concern   Not on file  Social History Narrative   Not on file   Social Determinants of Health   Financial Resource Strain: Low Risk  (08/02/2022)   Overall Financial Resource Strain (CARDIA)    Difficulty of Paying Living Expenses: Not hard  at all  Food Insecurity: No Food Insecurity (08/02/2022)   Hunger Vital Sign    Worried About Running Out of Food in the Last Year: Never true    Ran Out of Food in the Last Year: Never true  Transportation Needs: No Transportation Needs (08/02/2022)   PRAPARE - Administrator, Civil Service (Medical): No    Lack of Transportation (Non-Medical): No  Physical Activity: Insufficiently Active (08/02/2022)   Exercise Vital Sign    Days of Exercise per Week: 3 days    Minutes of Exercise per Session: 30 min  Stress: No Stress Concern Present (08/02/2022)   [employer] of Occupational Health - Occupational Stress Questionnaire    Feeling of Stress : Not at all  Social Connections: Socially Integrated (08/02/2022)   Social Connection and Isolation Panel [NHANES]    Frequency of Communication with Friends and Family: More than three times a week    Frequency of Social Gatherings with Friends and Family: More than three times a week    Attends Religious Services: More than 4 times per year    Active Member of [employer] or Organizations: Yes    Attends Engineer, structural: More than 4 times per year    Marital Status: Married  Catering manager Violence: Not At Risk (08/02/2022)   Humiliation, Afraid, Rape, and Kick questionnaire    Fear of Current or Ex-Partner: No    Emotionally Abused: No    Physically Abused: No    Sexually Abused: No    Outpatient Encounter Medications as of 03/19/2023  Medication Sig   albuterol (VENTOLIN HFA) 108 (90 Base) MCG/ACT inhaler Inhale 2 puffs into the lungs every 6 (six) hours as needed for wheezing or shortness of breath.   allopurinol (ZYLOPRIM) 300 MG tablet Take 1 tablet (300 mg total) by mouth every evening.   Ascorbic Acid (VITAMIN C PO) Take by mouth.   atenolol (TENORMIN) 50 MG tablet TAKE 1 TABLET BY MOUTH DAILY   BIOTIN PO Take by mouth.   budesonide (PULMICORT) 180 MCG/ACT inhaler Inhale 2 puffs into the lungs 2 (two) times daily.   cetirizine (ZYRTEC) 10 MG tablet Take 1 tablet by mouth daily.   cholecalciferol (VITAMIN D3) 25 MCG (1000 UT) tablet Take 1,000 Units by mouth daily.   esomeprazole (NEXIUM) 20 MG capsule Take 1 capsule (20 mg total) by mouth daily at 12 noon. For GERD   famotidine (PEPCID) 20 MG tablet Take 1 tablet (20 mg total) by mouth 2 (two) times daily for 14 days.   glucose blood (CONTOUR NEXT TEST) test strip Test BS daily and as needed Dx R73.09   Lancets 30G MISC Test BS daily and as needed Dx R73.09   meclizine (ANTIVERT) 25 MG tablet Take 1 tablet (25 mg total) by mouth  3 (three) times daily as needed for dizziness.   montelukast (SINGULAIR) 10 MG tablet TAKE 1 TABLET BY MOUTH IN THE  MORNING   Multiple Vitamin (MULTIVITAMIN) capsule Take 1 capsule by mouth daily.    sertraline (ZOLOFT) 50 MG tablet Take 1 tablet (50 mg total) by mouth daily. Put on file   No facility-administered encounter medications on file as of 03/19/2023.    Allergies  Allergen Reactions   Bee Venom Swelling   Crestor [Rosuvastatin] Other (See Comments)    Myalgias   Erythromycin Itching, Rash and Other (See Comments)   Rosuvastatin Calcium Other (See Comments)    Myalgias   Pravastatin Other (See Comments)  Pain in joints.   Zetia [Ezetimibe]    Aleve [Naproxen Sodium]     Gastritis   Tetracyclines & Related Other (See Comments)    unknown   Vytorin [Ezetimibe-Simvastatin] Other (See Comments)    unknown    Review of Systems  Constitutional:  Positive for activity change and appetite change. Negative for chills, diaphoresis, fatigue, fever, unexpected weight change and weight loss.  HENT: Negative.    Eyes: Negative.  Negative for photophobia and visual disturbance.  Respiratory:  Negative for cough, chest tightness and shortness of breath.   Cardiovascular:  Negative for chest pain, palpitations and leg swelling.  Gastrointestinal:  Positive for abdominal pain and anorexia. Negative for abdominal distention, anal bleeding, blood in stool, constipation, diarrhea, flatus, hematochezia, melena, nausea, rectal pain and vomiting.  Endocrine: Negative.   Genitourinary:  Negative for decreased urine volume, difficulty urinating, dysuria, frequency, hematuria and urgency.  Musculoskeletal:  Negative for arthralgias and myalgias.  Skin: Negative.   Allergic/Immunologic: Negative.   Neurological:  Negative for dizziness, tremors, seizures, syncope, facial asymmetry, speech difficulty, weakness, light-headedness, numbness and headaches.  Hematological: Negative.    Psychiatric/Behavioral:  Negative for confusion, hallucinations, sleep disturbance and suicidal ideas.   All other systems reviewed and are negative.       Objective:  BP 136/71   Pulse 72   Temp 97.8 F (36.6 C)   Ht 5\' 6"  (1.676 m)   Wt 183 lb 9.6 oz (83.3 kg)   SpO2 96%   BMI 29.63 kg/m    Wt Readings from Last 3 Encounters:  03/19/23 183 lb 9.6 oz (83.3 kg)  01/07/23 184 lb (83.5 kg)  09/13/22 181 lb (82.1 kg)    Physical Exam Vitals and nursing note reviewed.  Constitutional:      General: She is not in acute distress.    Appearance: Normal appearance. She is well-developed, well-groomed and overweight. She is not ill-appearing, toxic-appearing or diaphoretic.  HENT:     Head: Normocephalic and atraumatic.     Jaw: There is normal jaw occlusion.     Right Ear: Hearing normal.     Left Ear: Hearing normal.     Nose: Nose normal.     Mouth/Throat:     Lips: Pink.     Mouth: Mucous membranes are moist.     Pharynx: Oropharynx is clear. Uvula midline.  Eyes:     General: Lids are normal.     Extraocular Movements: Extraocular movements intact.     Conjunctiva/sclera: Conjunctivae normal.     Pupils: Pupils are equal, round, and reactive to light.  Neck:     Thyroid: No thyroid mass, thyromegaly or thyroid tenderness.     Vascular: No carotid bruit or JVD.     Trachea: Trachea and phonation normal.  Cardiovascular:     Rate and Rhythm: Normal rate and regular rhythm.     Chest Wall: PMI is not displaced.     Pulses: Normal pulses.     Heart sounds: Normal heart sounds. No murmur heard.    No friction rub. No gallop.  Pulmonary:     Effort: Pulmonary effort is normal. No respiratory distress.     Breath sounds: Normal breath sounds. No wheezing.  Abdominal:     General: Bowel sounds are normal. There is no distension or abdominal bruit.     Palpations: Abdomen is soft. There is no hepatomegaly or splenomegaly.     Tenderness: There is abdominal tenderness  in the right upper  quadrant and epigastric area. There is guarding. There is no right CVA tenderness, left CVA tenderness or rebound.     Hernia: No hernia is present.  Musculoskeletal:        General: Normal range of motion.     Cervical back: Normal range of motion and neck supple.     Right lower leg: No edema.     Left lower leg: No edema.  Lymphadenopathy:     Cervical: No cervical adenopathy.  Skin:    General: Skin is warm and dry.     Capillary Refill: Capillary refill takes less than 2 seconds.     Coloration: Skin is not cyanotic, jaundiced or pale.     Findings: No rash.  Neurological:     General: No focal deficit present.     Mental Status: She is alert and oriented to person, place, and time.     Sensory: Sensation is intact.     Motor: Motor function is intact.     Coordination: Coordination is intact.     Gait: Gait is intact.     Deep Tendon Reflexes: Reflexes are normal and symmetric.  Psychiatric:        Attention and Perception: Attention and perception normal.        Mood and Affect: Mood and affect normal.        Speech: Speech normal.        Behavior: Behavior normal. Behavior is cooperative.        Thought Content: Thought content normal.        Cognition and Memory: Cognition and memory normal.        Judgment: Judgment normal.     Results for orders placed or performed in visit on 11/29/22  Lipid panel  Result Value Ref Range   Cholesterol, Total 239 (H) 100 - 199 mg/dL   Triglycerides 161 0 - 149 mg/dL   HDL 50 >09 mg/dL   VLDL Cholesterol Cal 26 5 - 40 mg/dL   LDL Chol Calc (NIH) 604 (H) 0 - 99 mg/dL   Chol/HDL Ratio 4.8 (H) 0.0 - 4.4 ratio     EKG: SR 70, PR 174 ms, QT 400 ms, no acute ST-T changes or ectopy. No changes from prior EKG. Kari Baars, FNP-C  Urinalysis unremarkable.   Pertinent labs & imaging results that were available during my care of the patient were reviewed by me and considered in my medical decision  making.  Assessment & Plan:  Allyah was seen today for abdominal pain, bloated and gas.  Diagnoses and all orders for this visit:  Epigastric pain RUQ pain EKG without acute or ischemic changes. Urinalysis unremarkable. Will evaluate labs for possible pancreatitis, electrolyte disturbances, anemia, and leukocytosis. [place] to evaluate GB as pt does have RUQ tenderness. Aware to avoid fried, greasy, fatty, and fast foods. Will add H2 Antagonist to regimen to see if beneficial. Red flags discussed in detail. If workup is unremarkable and symptoms persist, will consider CT abd/pelvis. Aware to follow up in 2-4 weeks for reevaluation, sooner if warranted.  -     EKG 12-Lead -     Amylase -     CMP14+EGFR -     CBC with Differential/Platelet -     Lipase -     US Abdomen Limited RUQ (LIVER/GB); Future -     Urinalysis, Routine w reflex microscopic -     famotidine (PEPCID) 20 MG tablet; Take 1 tablet (20 mg total) by mouth 2 (  two) times daily for 14 days.     Continue all other maintenance medications.  Follow up plan: Return if symptoms worsen or fail to improve, for 2-4 weeks for abdominal pain.   Continue healthy lifestyle choices, including diet ([place] in fruits, vegetables, and lean proteins, and low in salt and simple carbohydrates) and exercise (at least 30 minutes of moderate physical activity daily).  The above assessment and management plan was discussed with the patient. The patient verbalized understanding of and has agreed to the management plan. Patient is aware to call the clinic if they develop any new symptoms or if symptoms persist or worsen. Patient is aware when to return to the clinic for a follow-up visit. Patient educated on when it is appropriate to go to the emergency department.   Kari Baars, FNP-C [facility] [phone]

## 2023-03-20 LAB — CMP14+EGFR
ALT: 53 IU/L — ABNORMAL HIGH (ref 0–32)
AST: 50 IU/L — ABNORMAL HIGH (ref 0–40)
Albumin: 4.3 g/dL (ref 3.8–4.8)
Alkaline Phosphatase: 112 IU/L (ref 44–121)
BUN/Creatinine Ratio: 13 (ref 12–28)
BUN: 12 mg/dL (ref 8–27)
Bilirubin Total: 0.5 mg/dL (ref 0.0–1.2)
CO2: 20 mmol/L (ref 20–29)
Calcium: 9.3 mg/dL (ref 8.7–10.3)
Chloride: 101 mmol/L (ref 96–106)
Creatinine, Ser: 0.96 mg/dL (ref 0.57–1.00)
Globulin, Total: 2.2 g/dL (ref 1.5–4.5)
Glucose: 298 mg/dL — ABNORMAL HIGH (ref 70–99)
Potassium: 4.2 mmol/L (ref 3.5–5.2)
Sodium: 137 mmol/L (ref 134–144)
Total Protein: 6.5 g/dL (ref 6.0–8.5)
eGFR: 63 mL/min/{1.73_m2} (ref >59)

## 2023-03-20 LAB — CBC WITH DIFFERENTIAL/PLATELET
Basophils Absolute: 0 10*3/uL (ref 0.0–0.2)
Basos: 0 %
EOS (ABSOLUTE): 0.2 10*3/uL (ref 0.0–0.4)
Eos: 3 %
Hematocrit: 42 % (ref 34.0–46.6)
Hemoglobin: 13.8 g/dL (ref 11.1–15.9)
Immature Grans (Abs): 0 10*3/uL (ref 0.0–0.1)
Immature Granulocytes: 0 %
Lymphocytes Absolute: 2.1 10*3/uL (ref 0.7–3.1)
Lymphs: 22 %
MCH: 29 pg (ref 26.6–33.0)
MCHC: 32.9 g/dL (ref 31.5–35.7)
MCV: 88 fL (ref 79–97)
Monocytes Absolute: 0.6 10*3/uL (ref 0.1–0.9)
Monocytes: 6 %
Neutrophils Absolute: 6.4 10*3/uL (ref 1.4–7.0)
Neutrophils: 69 %
Platelets: 193 10*3/uL (ref 150–450)
RBC: 4.76 x10E6/uL (ref 3.77–5.28)
RDW: 12.6 % (ref 11.7–15.4)
WBC: 9.3 10*3/uL (ref 3.4–10.8)

## 2023-03-20 LAB — AMYLASE: Amylase: 22 U/L — ABNORMAL LOW (ref 31–110)

## 2023-03-20 LAB — LIPASE: Lipase: 31 U/L (ref 14–85)

## 2023-03-25 DIAGNOSIS — H35371 Puckering of macula, right eye: Secondary | ICD-10-CM | POA: Diagnosis not present

## 2023-03-25 DIAGNOSIS — Z961 Presence of intraocular lens: Secondary | ICD-10-CM | POA: Diagnosis not present

## 2023-03-25 DIAGNOSIS — H40013 Open angle with borderline findings, low risk, bilateral: Secondary | ICD-10-CM | POA: Diagnosis not present

## 2023-03-27 LAB — ACUTE VIRAL HEPATITIS (HAV, HBV, HCV)
HCV Ab: NONREACTIVE
Hep A IgM: NEGATIVE
Hep B C IgM: NEGATIVE
Hepatitis B Surface Ag: NEGATIVE

## 2023-03-27 LAB — HGB A1C W/O EAG: Hgb A1c MFr Bld: 7.5 % — ABNORMAL HIGH (ref 4.8–5.6)

## 2023-03-27 LAB — HCV INTERPRETATION

## 2023-03-27 LAB — SPECIMEN STATUS REPORT

## 2023-04-01 ENCOUNTER — Telehealth: Payer: Self-pay | Admitting: Family Medicine

## 2023-04-03 ENCOUNTER — Ambulatory Visit (HOSPITAL_COMMUNITY)
Admission: RE | Admit: 2023-04-03 | Discharge: 2023-04-03 | Disposition: A | Discharge status: Home / Self Care | Payer: Medicare Other | Source: Ambulatory Visit | Attending: Family Medicine | Admitting: Family Medicine

## 2023-04-03 DIAGNOSIS — R1013 Epigastric pain: Secondary | ICD-10-CM | POA: Diagnosis not present

## 2023-04-03 DIAGNOSIS — R1011 Right upper quadrant pain: Secondary | ICD-10-CM | POA: Insufficient documentation

## 2023-04-03 DIAGNOSIS — K7689 Other specified diseases of liver: Secondary | ICD-10-CM | POA: Diagnosis not present

## 2023-04-10 ENCOUNTER — Other Ambulatory Visit: Payer: Self-pay | Admitting: Family Medicine

## 2023-04-14 ENCOUNTER — Telehealth: Payer: Self-pay | Admitting: Family Medicine

## 2023-04-14 MED ORDER — CONTOUR NEXT TEST VI STRP: ORAL_STRIP | 3 refills | Status: DC

## 2023-04-14 NOTE — Telephone Encounter (Signed)
Pt aware refill sent to pharmacy 

## 2023-04-14 NOTE — Telephone Encounter (Signed)
  Prescription Request  04/14/2023  Is this a "Controlled Substance" medicine? glucose blood (CONTOUR NEXT TEST) test strip   Have you seen your PCP in the last 2 weeks? 04/15/2020  If YES, route message to pool  -  If NO, patient needs to be scheduled for appointment.  What is the name of the medication or equipment? glucose blood (CONTOUR NEXT TEST) test strip   Have you contacted your pharmacy to request a refill? no   Which pharmacy would you like this sent to? [facility]   Patient notified that their request is being sent to the clinical staff for review and that they should receive a response within 2 business days.

## 2023-04-16 ENCOUNTER — Ambulatory Visit (INDEPENDENT_AMBULATORY_CARE_PROVIDER_SITE_OTHER): Payer: Medicare Other | Admitting: Family Medicine

## 2023-04-16 ENCOUNTER — Telehealth: Payer: Self-pay | Admitting: Family Medicine

## 2023-04-16 ENCOUNTER — Encounter: Payer: Self-pay | Admitting: Family Medicine

## 2023-04-16 VITALS — BP 119/73 | HR 67 | Temp 98.4°F | Ht 66.0 in | Wt 177.0 lb

## 2023-04-16 DIAGNOSIS — K760 Fatty (change of) liver, not elsewhere classified: Secondary | ICD-10-CM | POA: Diagnosis not present

## 2023-04-16 DIAGNOSIS — E1159 Type 2 diabetes mellitus with other circulatory complications: Secondary | ICD-10-CM

## 2023-04-16 DIAGNOSIS — E785 Hyperlipidemia, unspecified: Secondary | ICD-10-CM

## 2023-04-16 DIAGNOSIS — I152 Hypertension secondary to endocrine disorders: Secondary | ICD-10-CM | POA: Diagnosis not present

## 2023-04-16 DIAGNOSIS — E119 Type 2 diabetes mellitus without complications: Secondary | ICD-10-CM | POA: Insufficient documentation

## 2023-04-16 DIAGNOSIS — R87619 Unspecified abnormal cytological findings in specimens from cervix uteri: Secondary | ICD-10-CM | POA: Insufficient documentation

## 2023-04-16 DIAGNOSIS — E1169 Type 2 diabetes mellitus with other specified complication: Secondary | ICD-10-CM | POA: Diagnosis not present

## 2023-04-16 MED ORDER — LANCET DEVICE MISC: 0 refills | Status: DC

## 2023-04-16 MED ORDER — BLOOD GLUCOSE TEST VI STRP: ORAL_STRIP | 3 refills | Status: DC

## 2023-04-16 MED ORDER — LANCETS MISC. MISC: 3 refills | Status: DC

## 2023-04-16 MED ORDER — BLOOD GLUCOSE MONITORING SUPPL DEVI: 0 refills | Status: DC

## 2023-04-16 NOTE — Patient Instructions (Addendum)
Fatty Liver Disease  The liver converts food into energy, removes toxic material from the blood, makes important proteins, and absorbs necessary vitamins from food. Fatty liver disease occurs when too much fat has built up in your liver cells. Fatty liver disease is also called hepatic steatosis. In many cases, fatty liver disease does not cause symptoms or problems. It is often diagnosed when tests are being done for other reasons. However, over time, fatty liver can cause inflammation that may lead to more serious liver problems, such as scarring of the liver (cirrhosis) and liver failure. Fatty liver is associated with insulin resistance, increased body fat, high blood pressure (hypertension), and high cholesterol. These are features of metabolic syndrome and increase your risk for stroke, diabetes, and heart disease. What are the causes? This condition may be caused by components of metabolic syndrome: Obesity. Insulin resistance. High cholesterol. Other causes: Alcohol abuse. Poor nutrition. Cushing syndrome. Pregnancy. Certain drugs. Poisons. Some viral infections. What increases the risk? You are more likely to develop this condition if you: Abuse alcohol. Are overweight. Have diabetes. Have hepatitis. Have a high triglyceride level. Are pregnant. What are the signs or symptoms? Fatty liver disease often does not cause symptoms. If symptoms do develop, they can include: Fatigue and weakness. Weight loss. Confusion. Nausea, vomiting, or abdominal pain. Yellowing of your skin and the white parts of your eyes (jaundice). Itchy skin. How is this diagnosed? This condition may be diagnosed by: A physical exam and your medical history. Blood tests. Imaging tests, such as an ultrasound, CT scan, or MRI. A liver biopsy. A small sample of liver tissue is removed using a needle. The sample is then looked at under a microscope. How is this treated? Fatty liver disease is often  caused by other health conditions. Treatment for fatty liver may involve medicines and lifestyle changes to manage conditions such as: Alcoholism. High cholesterol. Diabetes. Being overweight or obese. Follow these instructions at home:  Do not drink alcohol. If you have trouble quitting, ask your health care provider how to safely quit with the help of medicine or a supervised program. This is important to keep your condition from getting worse. Eat a healthy diet as told by your health care provider. Ask your health care provider about working with a dietitian to develop an eating plan. Exercise regularly. This can help you lose weight and control your cholesterol and diabetes. Talk to your health care provider about an exercise plan and which activities are best for you. Take over-the-counter and prescription medicines only as told by your health care provider. Keep all follow-up visits. This is important. Contact a health care provider if: You have trouble controlling your: Blood sugar. This is especially important if you have diabetes. Cholesterol. Drinking of alcohol. Get help right away if: You have abdominal pain. You have jaundice. You have nausea and are vomiting. You vomit blood or material that looks like coffee grounds. You have stools that are black, tar-like, or bloody. Summary Fatty liver disease develops when too much fat builds up in the cells of your liver. Fatty liver disease often causes no symptoms or problems. However, over time, fatty liver can cause inflammation that may lead to more serious liver problems, such as scarring of the liver (cirrhosis). You are more likely to develop this condition if you abuse alcohol, are pregnant, are overweight, have diabetes, have hepatitis, or have high triglyceride or cholesterol levels. Contact your health care provider if you have trouble controlling your blood  sugar, cholesterol, or drinking of alcohol. This information is  not intended to replace advice given to you by your health care provider. Make sure you discuss any questions you have with your health care provider. Document Revised: 06/15/2020 Document Reviewed: 06/15/2020 Elsevier Patient Education  2024 Elsevier Inc.   Diabetes Mellitus and Standards of Medical Care Living with and managing diabetes (diabetes mellitus) can be complicated. Your diabetes treatment may be managed by a team of health care providers, including: A physician who specializes in diabetes (endocrinologist). You might also have visits with a nurse practitioner or physician assistant. Nurses. A registered dietitian. A certified diabetes care and education specialist. An exercise specialist. A pharmacist. An eye doctor. A foot specialist (podiatrist). A dental care provider. A primary care provider. A mental health care provider. How to manage your diabetes You can do many things to successfully manage your diabetes. Your health care providers will follow guidelines to help you get the best quality of care. Here are general guidelines for your diabetes management plan. Your health care providers may give you more specific instructions. Physical exams When you are diagnosed with diabetes, and each year after that, your health care provider will ask about your medical and family history. You will have a physical exam, which may include: Measuring your height, weight, and body mass index (BMI). Checking your blood pressure. This will be done at every routine medical visit. Your target blood pressure may vary depending on your medical conditions, your age, and other factors. A thyroid exam. A skin exam. Screening for nerve damage (peripheral neuropathy). This may include checking the pulse in your legs and feet and the level of sensation in your hands and feet. A foot exam to inspect the structure and skin of your feet, including checking for cuts, bruises, redness, blisters, sores,  or other problems. Screening for blood vessel (vascular) problems. This may include checking the pulse in your legs and feet and checking your temperature. Blood tests Depending on your treatment plan and your personal needs, you may have the following tests: Hemoglobin A1C (HbA1C). This test provides information about blood sugar (glucose) control over the previous 2-3 months. It is used to adjust your treatment plan, if needed. This test will be done: At least 2 times a year, if you are meeting your treatment goals. 4 times a year, if you are not meeting your treatment goals or if your goals have changed. Lipid testing, including total cholesterol, LDL and HDL cholesterol, and triglyceride levels. The goal for LDL is less than 100 mg/dL (5.5 mmol/L). If you are at high risk for complications, the goal is less than 70 mg/dL (3.9 mmol/L). The goal for HDL is 40 mg/dL (2.2 mmol/L) or higher for men, and 50 mg/dL (2.8 mmol/L) or higher for women. An HDL cholesterol of 60 mg/dL (3.3 mmol/L) or higher gives some protection against heart disease. The goal for triglycerides is less than 150 mg/dL (8.3 mmol/L). Liver function tests. Kidney function tests. Thyroid function tests.  Dental and eye exams  Visit your dentist two times a year. If you have type 1 diabetes, your health care provider may recommend an eye exam within 5 years after you are diagnosed, and then once a year after your first exam. For children with type 1 diabetes, the health care provider may recommend an eye exam when your child is age 72 or older and has had diabetes for 3-5 years. After the first exam, your child should get an eye exam  once a year. If you have type 2 diabetes, your health care provider may recommend an eye exam as soon as you are diagnosed, and then every 1-2 years after your first exam. Immunizations A yearly flu (influenza) vaccine is recommended annually for everyone 6 months or older. This is especially  important if you have diabetes. The pneumonia (pneumococcal) vaccine is recommended for everyone 2 years or older who has diabetes. If you are age 50 or older, you may get the pneumonia vaccine as a series of two separate shots. The hepatitis B vaccine is recommended for adults shortly after being diagnosed with diabetes. Adults and children with diabetes should receive all other vaccines according to age-specific recommendations from the Centers for Disease Control and Prevention (CDC). Mental and emotional health Screening for symptoms of eating disorders, anxiety, and depression is recommended at the time of diagnosis and after as needed. If your screening shows that you have symptoms, you may need more evaluation. You may work with a mental health care provider. Follow these instructions at home: Treatment plan You will monitor your blood glucose levels and may give yourself insulin. Your treatment plan will be reviewed at every medical visit. You and your health care provider will discuss: How you are taking your medicines, including insulin. Any side effects you have. Your blood glucose level target goals. How often you monitor your blood glucose level. Lifestyle habits, such as activity level and tobacco, alcohol, and substance use. Education Your health care provider will assess how well you are monitoring your blood glucose levels and whether you are taking your insulin and medicines correctly. He or she may refer you to: A certified diabetes care and education specialist to manage your diabetes throughout your life, starting at diagnosis. A registered dietitian who can create and review your personal nutrition plan. An exercise specialist who can discuss your activity level and exercise plan. General instructions Take over-the-counter and prescription medicines only as told by your health care provider. Keep all follow-up visits. This is important. Where to find support There are  many diabetes support networks, including: American Diabetes Association (ADA): diabetes.org Defeat Diabetes Foundation: defeatdiabetes.org Where to find more information American Diabetes Association (ADA): www.diabetes.org Association of Diabetes Care & Education Specialists (ADCES): diabeteseducator.org International Diabetes Federation (IDF): http://hill.biz/ Summary Managing diabetes (diabetes mellitus) can be complicated. Your diabetes treatment may be managed by a team of health care providers. Your health care providers follow guidelines to help you get the best quality care. You should have physical exams, blood tests, blood pressure monitoring, immunizations, and screening tests regularly. Stay updated on how to manage your diabetes. Your health care providers may also give you more specific instructions based on your individual health. This information is not intended to replace advice given to you by your health care provider. Make sure you discuss any questions you have with your health care provider. Document Revised: 03/09/2020 Document Reviewed: 03/09/2020 Elsevier Patient Education  2024 [employer].

## 2023-04-16 NOTE — Progress Notes (Signed)
Subjective: CC:DM PCP: Raliegh Ip, DO BJY:NWGNFAO J Tedder is a 72 y.o. female presenting to clinic today for:  1. NEW ONSET Type 2 Diabetes with hypertension, hyperlipidemia and fatty liver:  Patient has been prediabetic and borderline diabetic for years.  Infection he has had an isolated A1c of 6.7 about 4-1/2 years ago with her previous PCP.  This was felt to be related to use of corticosteroid at the time and she was able to get sugar down through diet and exercise.  Recently, she was seen by one of my partners and noted to have an A1c of 7.5 after complaining of some abdominal pain.  She voices quite a bit of concern of this new diagnosis and notes that she is been fairly strict with carbohydrates, salt and red meat since that visit at the beginning of the month.  She has lost some weight as a result.  She is not exercising yet but plans to start.  She reports feeling a little fatigued and lightheaded at times but denies any LOC or falls.  She was not aware that her cholesterol and diet could impact her fatty liver.  She is talked about cholesterol controllers with her cardiologist previously and last visit they recommended that she consider injectable Praluent or Repatha.  She would be very interested in seeing a nutritionist because the things that she reads online are very confusing  Diabetes Health Maintenance Due  Topic Date Due   FOOT EXAM  Never done   OPHTHALMOLOGY EXAM  Never done   HEMOGLOBIN A1C  09/19/2023    Last A1c:  Lab Results  Component Value Date   HGBA1C 7.5 (H) 03/19/2023   ROS: Per HPI  Allergies  Allergen Reactions   Bee Venom Swelling   Crestor [Rosuvastatin] Other (See Comments)    Myalgias   Erythromycin Itching, Rash and Other (See Comments)   Rosuvastatin Calcium Other (See Comments)    Myalgias   Pravastatin Other (See Comments)    Pain in joints.   Zetia [Ezetimibe]    Aleve [Naproxen Sodium]     Gastritis   Tetracyclines & Related  Other (See Comments)    unknown   Vytorin [Ezetimibe-Simvastatin] Other (See Comments)    unknown   Past Medical History:  Diagnosis Date   Allergy    Asthma    Back pain    Cancer (HCC)    skin cancer   Depression    Hyperlipidemia     Current Outpatient Medications:    albuterol (VENTOLIN HFA) 108 (90 Base) MCG/ACT inhaler, Inhale 2 puffs into the lungs every 6 (six) hours as needed for wheezing or shortness of breath., Disp: 1 each, Rfl: 0   allopurinol (ZYLOPRIM) 300 MG tablet, Take 1 tablet (300 mg total) by mouth every evening., Disp: 100 tablet, Rfl: 3   Ascorbic Acid (VITAMIN C PO), Take by mouth., Disp: , Rfl:    atenolol (TENORMIN) 50 MG tablet, TAKE 1 TABLET BY MOUTH DAILY, Disp: 100 tablet, Rfl: 2   BIOTIN PO, Take by mouth., Disp: , Rfl:    budesonide (PULMICORT) 180 MCG/ACT inhaler, Inhale 2 puffs into the lungs 2 (two) times daily., Disp: , Rfl:    cetirizine (ZYRTEC) 10 MG tablet, Take 1 tablet by mouth daily., Disp: , Rfl:    cholecalciferol (VITAMIN D3) 25 MCG (1000 UT) tablet, Take 1,000 Units by mouth daily., Disp: , Rfl:    esomeprazole (NEXIUM) 20 MG capsule, Take 1 capsule (20 mg total) by  mouth daily at 12 noon. For GERD, Disp: 100 capsule, Rfl: 3   famotidine (PEPCID) 20 MG tablet, Take 1 tablet (20 mg total) by mouth 2 (two) times daily for 14 days., Disp: 28 tablet, Rfl: 0   glucose blood (CONTOUR NEXT TEST) test strip, Test BS daily and as needed Dx R73.09, Disp: 100 each, Rfl: 3   Lancets 30G MISC, Test BS daily and as needed Dx R73.09, Disp: 100 each, Rfl: 3   meclizine (ANTIVERT) 25 MG tablet, Take 1 tablet (25 mg total) by mouth 3 (three) times daily as needed for dizziness., Disp: 30 tablet, Rfl: 2   montelukast (SINGULAIR) 10 MG tablet, TAKE 1 TABLET BY MOUTH IN THE  MORNING, Disp: 100 tablet, Rfl: 2   Multiple Vitamin (MULTIVITAMIN) capsule, Take 1 capsule by mouth daily. , Disp: , Rfl:    sertraline (ZOLOFT) 50 MG tablet, Take 1 tablet (50 mg total)  by mouth daily. Put on file, Disp: 100 tablet, Rfl: 3 Social History   Socioeconomic History   Marital status: Married    Spouse name: Not on file   Number of children: Not on file   Years of education: Not on file   Highest education level: Not on file  Occupational History   Not on file  Tobacco Use   Smoking status: Never   Smokeless tobacco: Never  Vaping Use   Vaping status: Never Used  Substance and Sexual Activity   Alcohol use: No   Drug use: No   Sexual activity: Not on file  Other Topics Concern   Not on file  Social History Narrative   Not on file   Social Determinants of Health   Financial Resource Strain: Low Risk  (08/02/2022)   Overall Financial Resource Strain (CARDIA)    Difficulty of Paying Living Expenses: Not hard at all  Food Insecurity: No Food Insecurity (08/02/2022)   Hunger Vital Sign    Worried About Running Out of Food in the Last Year: Never true    Ran Out of Food in the Last Year: Never true  Transportation Needs: No Transportation Needs (08/02/2022)   PRAPARE - Administrator, Civil Service (Medical): No    Lack of Transportation (Non-Medical): No  Physical Activity: Insufficiently Active (08/02/2022)   Exercise Vital Sign    Days of Exercise per Week: 3 days    Minutes of Exercise per Session: 30 min  Stress: No Stress Concern Present (08/02/2022)   [employer] of Occupational Health - Occupational Stress Questionnaire    Feeling of Stress : Not at all  Social Connections: Socially Integrated (08/02/2022)   Social Connection and Isolation Panel [NHANES]    Frequency of Communication with Friends and Family: More than three times a week    Frequency of Social Gatherings with Friends and Family: More than three times a week    Attends Religious Services: More than 4 times per year    Active Member of [employer] or Organizations: Yes    Attends Engineer, structural: More than 4 times per year    Marital Status:  Married  Catering manager Violence: Not At Risk (08/02/2022)   Humiliation, Afraid, Rape, and Kick questionnaire    Fear of Current or Ex-Partner: No    Emotionally Abused: No    Physically Abused: No    Sexually Abused: No   Family History  Problem Relation Age of Onset   Heart disease Mother    Kidney disease Mother  Breast cancer Mother    Coronary artery disease Mother        CABG at age 46   Dementia Father    Stroke Father    Stroke Maternal Grandmother    Cancer Maternal Grandfather    Cancer Paternal Grandmother    Kidney disease Paternal Grandfather     Objective: Office vital signs reviewed. BP 119/73   Pulse 67   Temp 98.4 F (36.9 C)   Ht 5\' 6"  (1.676 m)   Wt 177 lb (80.3 kg)   SpO2 96%   BMI 28.57 kg/m   Physical Examination:  General: Awake, alert, well-appearing, No acute distress HEENT: sclera white, MMM   Assessment/ Plan: 72 y.o. female   New onset type 2 diabetes mellitus (HCC) - Plan: Blood Glucose Monitoring Suppl DEVI, Glucose Blood (BLOOD GLUCOSE TEST STRIPS) STRP, Lancet Device MISC, Lancets Misc. MISC, Amb ref to Medical Nutrition Therapy-MNT  Hypertension associated with diabetes (HCC) - Plan: Amb ref to Medical Nutrition Therapy-MNT  Hyperlipidemia associated with type 2 diabetes mellitus (HCC) - Plan: Amb ref to Medical Nutrition Therapy-MNT  Fatty liver - Plan: Amb ref to Medical Nutrition Therapy-MNT  Discussed lifestyle modification I reviewed with her ways to modify her diet and efforts to bring her sugar down naturally without any additional medication.  I gave her a handout deciding portion sizes and foods to avoid.  I have also placed a referral to nutrition.  I would like to see her back in about 2 months to repeat A1c, performed urine microalbumin and foot exam.  Discussed gold standards of care for diabetes and a handout was provided about this as well and I encouraged her to get a retinal exam with her eye doctor next  visit.  Her blood pressure is controlled  We again discussed need for consideration of treatment for cholesterol, particularly given fatty liver deposition and ongoing elevations in cholesterol and now new onset type 2 diabetes.  She seems to be strongly considering starting Repatha and I will CC her cardiologist as FYI to maybe assist with this.  Total time spent with patient 27 minutes.  Greater than 50% of encounter spent in coordination of care/counseling.   No orders of the defined types were placed in this encounter.  No orders of the defined types were placed in this encounter.    Raliegh Ip, DO [facility] [phone]

## 2023-04-16 NOTE — Telephone Encounter (Signed)
Pt wanted to let Dr Nadine Counts know that her insurance covers the brand One Touch, if Dr Nadine Counts will send meter and strips in for her. Pt uses [facility] in [place].

## 2023-04-22 ENCOUNTER — Telehealth: Payer: Self-pay | Admitting: Pharmacist

## 2023-04-22 DIAGNOSIS — E119 Type 2 diabetes mellitus without complications: Secondary | ICD-10-CM

## 2023-04-22 MED ORDER — ONETOUCH VERIO VI STRP: ORAL_STRIP | 5 refills | Status: DC

## 2023-04-22 MED ORDER — ONETOUCH VERIO FLEX SYSTEM W/DEVICE KIT: PACK | 0 refills | Status: DC

## 2023-04-22 MED ORDER — ONETOUCH DELICA LANCETS 33G MISC: 5 refills | Status: DC

## 2023-04-23 NOTE — Telephone Encounter (Signed)
    04/22/2023 Name: [name] MRN: [id number] DOB: [birth year]   New onset T2DM -One Touch Verio glucometer sent in to [facility] per patient request -One touch kit, test strips & delica lancets send in -Routed patient call back to clinical pool to assess patient pick up -please refer back to me if patient needs assistance with education/set up  Lab Results  Component Value Date   HGBA1C 7.5 (H) 03/19/2023     Kieth Brightly, PharmD, BCACP Clinical Pharmacist, [facility]

## 2023-04-23 NOTE — Telephone Encounter (Signed)
Sent in one touch verio kit, strips & lancets Please make sure patient gets Sent to [facility] per Misty Stanley up front (patient stated)

## 2023-05-09 ENCOUNTER — Ambulatory Visit: Payer: Medicare Other | Admitting: Family Medicine

## 2023-06-02 ENCOUNTER — Telehealth: Payer: Self-pay | Admitting: Family Medicine

## 2023-06-02 DIAGNOSIS — R748 Abnormal levels of other serum enzymes: Secondary | ICD-10-CM

## 2023-06-02 DIAGNOSIS — E119 Type 2 diabetes mellitus without complications: Secondary | ICD-10-CM

## 2023-06-09 DIAGNOSIS — R7309 Other abnormal glucose: Secondary | ICD-10-CM | POA: Diagnosis not present

## 2023-06-09 DIAGNOSIS — M9903 Segmental and somatic dysfunction of lumbar region: Secondary | ICD-10-CM | POA: Diagnosis not present

## 2023-06-09 DIAGNOSIS — M62830 Muscle spasm of back: Secondary | ICD-10-CM | POA: Diagnosis not present

## 2023-06-09 DIAGNOSIS — Z1231 Encounter for screening mammogram for malignant neoplasm of breast: Secondary | ICD-10-CM | POA: Diagnosis not present

## 2023-06-09 DIAGNOSIS — M9901 Segmental and somatic dysfunction of cervical region: Secondary | ICD-10-CM | POA: Diagnosis not present

## 2023-06-09 DIAGNOSIS — M9902 Segmental and somatic dysfunction of thoracic region: Secondary | ICD-10-CM | POA: Diagnosis not present

## 2023-06-09 DIAGNOSIS — Z139 Encounter for screening, unspecified: Secondary | ICD-10-CM | POA: Diagnosis not present

## 2023-06-10 DIAGNOSIS — L309 Dermatitis, unspecified: Secondary | ICD-10-CM | POA: Diagnosis not present

## 2023-06-10 DIAGNOSIS — L570 Actinic keratosis: Secondary | ICD-10-CM | POA: Diagnosis not present

## 2023-06-10 NOTE — Telephone Encounter (Signed)
Ok for labs to be collected. Please advise she needs urine as well. Urine micro ordered.  I would like her rescheduled ASAP so we can perform DM foot exam and DM eye exams.  Please schedule accordingly.

## 2023-06-11 ENCOUNTER — Other Ambulatory Visit: Payer: Self-pay | Admitting: Obstetrics and Gynecology

## 2023-06-11 ENCOUNTER — Encounter (INDEPENDENT_AMBULATORY_CARE_PROVIDER_SITE_OTHER): Payer: Self-pay | Admitting: *Deleted

## 2023-06-11 DIAGNOSIS — R928 Other abnormal and inconclusive findings on diagnostic imaging of breast: Secondary | ICD-10-CM

## 2023-06-11 NOTE — Telephone Encounter (Signed)
Attempted to call pt , left detailed vm

## 2023-06-12 ENCOUNTER — Encounter (INDEPENDENT_AMBULATORY_CARE_PROVIDER_SITE_OTHER): Payer: Self-pay | Admitting: *Deleted

## 2023-06-12 ENCOUNTER — Encounter: Payer: Medicare Other | Attending: Family Medicine | Admitting: Nutrition

## 2023-06-12 ENCOUNTER — Telehealth: Payer: Self-pay | Admitting: Family Medicine

## 2023-06-12 VITALS — Ht 66.0 in | Wt 179.0 lb

## 2023-06-12 DIAGNOSIS — E1169 Type 2 diabetes mellitus with other specified complication: Secondary | ICD-10-CM | POA: Diagnosis not present

## 2023-06-12 DIAGNOSIS — E669 Obesity, unspecified: Secondary | ICD-10-CM

## 2023-06-12 DIAGNOSIS — E782 Mixed hyperlipidemia: Secondary | ICD-10-CM

## 2023-06-12 DIAGNOSIS — E1159 Type 2 diabetes mellitus with other circulatory complications: Secondary | ICD-10-CM | POA: Diagnosis not present

## 2023-06-12 DIAGNOSIS — I152 Hypertension secondary to endocrine disorders: Secondary | ICD-10-CM | POA: Diagnosis not present

## 2023-06-12 DIAGNOSIS — Z713 Dietary counseling and surveillance: Secondary | ICD-10-CM | POA: Insufficient documentation

## 2023-06-12 DIAGNOSIS — E119 Type 2 diabetes mellitus without complications: Secondary | ICD-10-CM

## 2023-06-12 DIAGNOSIS — K760 Fatty (change of) liver, not elsewhere classified: Secondary | ICD-10-CM | POA: Diagnosis not present

## 2023-06-12 NOTE — Telephone Encounter (Signed)
Attempted to contact - NA

## 2023-06-12 NOTE — Progress Notes (Signed)
Medical Nutrition Therapy  Appointment Start time:  0800  Appointment End time:  0900  Primary concerns today: Dm Type 2  Referral diagnosis: E11.8 Preferred learning style: NO Preference Learning readiness: Ready   NUTRITION ASSESSMENT  72 yr old wfemale referred for Type 2 DM. Newly Diagnosed. PCP Dr. Nadine Counts. A1C 7.3%.  Is working on changing her DM with diet and exercise and doesn't want to go on medication if possible. Hyperlipidemia noted. Not able/willing to take statins for her hyperlipidemia. Lab Results  Component Value Date   HGBA1C 7.5 (H) 03/19/2023    She is willing to work with Lifestyle Medicine and focus on whole plant based foods and the  6 pillars of health to reverse her Dm and reduce further medical problems.   Clinical Medical Hx:  Past Medical History:  Diagnosis Date   Allergy    Asthma    Back pain    Cancer (HCC)    skin cancer   Depression    Hyperlipidemia    Seizures (HCC) 12/14/2014   Tendinitis of left rotator cuff 07/20/2020   Transient memory loss 12/09/2014   Medications:  Current Outpatient Medications on File Prior to Visit  Medication Sig Dispense Refill   albuterol (VENTOLIN HFA) 108 (90 Base) MCG/ACT inhaler Inhale 2 puffs into the lungs every 6 (six) hours as needed for wheezing or shortness of breath. 1 each 0   allopurinol (ZYLOPRIM) 300 MG tablet Take 1 tablet (300 mg total) by mouth every evening. 100 tablet 3   Ascorbic Acid (VITAMIN C PO) Take by mouth.     atenolol (TENORMIN) 50 MG tablet TAKE 1 TABLET BY MOUTH DAILY 100 tablet 2   BIOTIN PO Take by mouth.     Blood Glucose Monitoring Suppl (ONETOUCH VERIO FLEX SYSTEM) w/Device KIT Use to test blood sugar daily as directed DX: E11.65 1 kit 0   budesonide (PULMICORT) 180 MCG/ACT inhaler Inhale 2 puffs into the lungs 2 (two) times daily.     cetirizine (ZYRTEC) 10 MG tablet Take 1 tablet by mouth daily.     cholecalciferol (VITAMIN D3) 25 MCG (1000 UT) tablet Take 1,000  Units by mouth daily.     esomeprazole (NEXIUM) 20 MG capsule Take 1 capsule (20 mg total) by mouth daily at 12 noon. For GERD 100 capsule 3   famotidine (PEPCID) 20 MG tablet Take 1 tablet (20 mg total) by mouth 2 (two) times daily for 14 days. 28 tablet 0   glucose blood (ONETOUCH VERIO) test strip Use to test blood sugar daily as directed DX: E11.65 200 each 5   Lancet Device MISC Check BGs every morning. E11.9.May substitute to any manufacturer covered by patient's insurance. 1 each 0   Lancets Misc. MISC Check BGs every morning. E11.9.May substitute to any manufacturer covered by patient's insurance. 100 each 3   meclizine (ANTIVERT) 25 MG tablet Take 1 tablet (25 mg total) by mouth 3 (three) times daily as needed for dizziness. 30 tablet 2   montelukast (SINGULAIR) 10 MG tablet TAKE 1 TABLET BY MOUTH IN THE  MORNING 100 tablet 2   Multiple Vitamin (MULTIVITAMIN) capsule Take 1 capsule by mouth daily.      OneTouch Delica Lancets 33G MISC Use to test blood sugar daily as directed DX: E11.65 200 each 5   sertraline (ZOLOFT) 50 MG tablet Take 1 tablet (50 mg total) by mouth daily. Put on file 100 tablet 3   No current facility-administered medications on file prior to visit.  Labs:  Lab Results  Component Value Date   HGBA1C 7.5 (H) 03/19/2023      Latest Ref Rng & Units 03/19/2023   10:46 AM 09/28/2022    2:44 PM 04/23/2021   11:53 AM  CMP  Glucose 70 - 99 mg/dL 161  096  93   BUN 8 - 27 mg/dL 12  15  16    Creatinine 0.57 - 1.00 mg/dL 0.45  4.09  8.11   Sodium 134 - 144 mmol/L 137  133  143   Potassium 3.5 - 5.2 mmol/L 4.2  4.1  4.7   Chloride 96 - 106 mmol/L 101  97  104   CO2 20 - 29 mmol/L 20  24  17    Calcium 8.7 - 10.3 mg/dL 9.3  9.3  9.7   Total Protein 6.0 - 8.5 g/dL 6.5     Total Bilirubin 0.0 - 1.2 mg/dL 0.5     Alkaline Phos 44 - 121 IU/L 112     AST 0 - 40 IU/L 50     ALT 0 - 32 IU/L 53      Lipid Panel     Component Value Date/Time   CHOL 239 (H) 11/29/2022  0940   TRIG 145 11/29/2022 0940   HDL 50 11/29/2022 0940   CHOLHDL 4.8 (H) 11/29/2022 0940   LDLCALC 163 (H) 11/29/2022 0940   LABVLDL 26 11/29/2022 0940    Notable Signs/Symptoms: None  Lifestyle & Dietary Hx Married and lives with her husband. Helps care for grandkids  Estimated daily fluid intake: 30 oz Supplements: VIt C, Vit D3 Sleep: varies Stress / self-care:  Current average weekly physical activity: ADL  24-Hr Dietary Recall Eats 2-3 meals per day. Tends to eat later at night sometimes.  Estimated Energy Needs Calories: 1200 Carbohydrate: 135g Protein: 90g Fat: 33g   NUTRITION DIAGNOSIS  NB-1.1 Food and nutrition-related knowledge deficit As related to Diabetes Type 2.  As evidenced by A1C 7.4%.Marland Kitchen   NUTRITION INTERVENTION  Nutrition education (E-1) on the following topics:  Nutrition and Diabetes education provided on My Plate, CHO counting, meal planning, portion sizes, timing of meals, avoiding snacks between meals unless having a low blood sugar, target ranges for A1C and blood sugars, signs/symptoms and treatment of hyper/hypoglycemia, monitoring blood sugars, taking medications as prescribed, benefits of exercising 30 minutes per day and prevention of complications of DM.  Lifestyle Medicine  - Whole Food, Plant Predominant Nutrition is highly recommended: Eat Plenty of vegetables, Mushrooms, fruits, Legumes, Whole Grains, Nuts, seeds in lieu of processed meats, processed snacks/pastries red meat, poultry, eggs.    -It is better to avoid simple carbohydrates including: Cakes, Sweet Desserts, Ice Cream, Soda (diet and regular), Sweet Tea, Candies, Chips, Cookies, Store Bought Juices, Alcohol in Excess of  1-2 drinks a day, Lemonade,  Artificial Sweeteners, Doughnuts, Coffee Creamers, "Sugar-free" Products, etc, etc.  This is not a complete list.....  Exercise: If you are able: 30 -60 minutes a day ,4 days a week, or 150 minutes a week.  The longer the better.   Combine stretch, strength, and aerobic activities.  If you were told in the past that you have high risk for cardiovascular diseases, you may seek evaluation by your heart doctor prior to initiating moderate to intense exercise programs.   Handouts Provided Include  Lifestyle Medicine handouts Know your numbers  Learning Style & Readiness for Change Teaching method utilized: Visual & Auditory  Demonstrated degree of understanding via: Teach Back  Barriers  to learning/adherence to lifestyle change: none  Goals Established by Pt Eat three meals per day at times discussed Focus on whole plant based foods Avoid snacks between meals. Or after supper. Drink only water Walk 30+ minutes 4-5 times per week. Get A1C 6% or less. Eat high fiber foods to bring cholesterol down.    MONITORING & EVALUATION Dietary intake, weekly physical activity, and BS in 1 month.  Next Steps  Patient is to work on following Lifestyle Medicine pillars of health.Marland Kitchen

## 2023-06-16 ENCOUNTER — Encounter: Payer: Self-pay | Admitting: Nutrition

## 2023-06-16 NOTE — Patient Instructions (Signed)
Eat three meals per day at times discussed Focus on whole plant based foods Avoid snacks between meals. Or after supper. Drink only water Walk 30+ minutes 4-5 times per week. Get A1C 6% or less. Eat high fiber foods to bring cholesterol down.

## 2023-06-19 DIAGNOSIS — M9903 Segmental and somatic dysfunction of lumbar region: Secondary | ICD-10-CM | POA: Diagnosis not present

## 2023-06-19 DIAGNOSIS — M9902 Segmental and somatic dysfunction of thoracic region: Secondary | ICD-10-CM | POA: Diagnosis not present

## 2023-06-19 DIAGNOSIS — M9901 Segmental and somatic dysfunction of cervical region: Secondary | ICD-10-CM | POA: Diagnosis not present

## 2023-06-19 DIAGNOSIS — M62830 Muscle spasm of back: Secondary | ICD-10-CM | POA: Diagnosis not present

## 2023-06-20 ENCOUNTER — Ambulatory Visit: Payer: Medicare Other

## 2023-06-20 ENCOUNTER — Ambulatory Visit
Admission: RE | Admit: 2023-06-20 | Discharge: 2023-06-20 | Disposition: A | Discharge status: Home / Self Care | Payer: Medicare Other | Source: Ambulatory Visit | Attending: Obstetrics and Gynecology | Admitting: Obstetrics and Gynecology

## 2023-06-20 DIAGNOSIS — R928 Other abnormal and inconclusive findings on diagnostic imaging of breast: Secondary | ICD-10-CM

## 2023-06-23 ENCOUNTER — Other Ambulatory Visit: Payer: Self-pay

## 2023-06-23 ENCOUNTER — Ambulatory Visit (INDEPENDENT_AMBULATORY_CARE_PROVIDER_SITE_OTHER): Payer: Medicare Other | Admitting: Gastroenterology

## 2023-06-23 ENCOUNTER — Other Ambulatory Visit: Payer: Medicare Other

## 2023-06-23 ENCOUNTER — Ambulatory Visit (INDEPENDENT_AMBULATORY_CARE_PROVIDER_SITE_OTHER): Payer: Medicare Other | Admitting: Family Medicine

## 2023-06-23 ENCOUNTER — Encounter: Payer: Self-pay | Admitting: Family Medicine

## 2023-06-23 VITALS — BP 130/82 | HR 63 | Temp 98.6°F | Ht 66.0 in | Wt 176.0 lb

## 2023-06-23 DIAGNOSIS — E785 Hyperlipidemia, unspecified: Secondary | ICD-10-CM

## 2023-06-23 DIAGNOSIS — I152 Hypertension secondary to endocrine disorders: Secondary | ICD-10-CM | POA: Diagnosis not present

## 2023-06-23 DIAGNOSIS — M62830 Muscle spasm of back: Secondary | ICD-10-CM | POA: Diagnosis not present

## 2023-06-23 DIAGNOSIS — T466X5D Adverse effect of antihyperlipidemic and antiarteriosclerotic drugs, subsequent encounter: Secondary | ICD-10-CM | POA: Diagnosis not present

## 2023-06-23 DIAGNOSIS — M9901 Segmental and somatic dysfunction of cervical region: Secondary | ICD-10-CM | POA: Diagnosis not present

## 2023-06-23 DIAGNOSIS — E1159 Type 2 diabetes mellitus with other circulatory complications: Secondary | ICD-10-CM | POA: Diagnosis not present

## 2023-06-23 DIAGNOSIS — E119 Type 2 diabetes mellitus without complications: Secondary | ICD-10-CM

## 2023-06-23 DIAGNOSIS — M609 Myositis, unspecified: Secondary | ICD-10-CM | POA: Insufficient documentation

## 2023-06-23 DIAGNOSIS — M9902 Segmental and somatic dysfunction of thoracic region: Secondary | ICD-10-CM | POA: Diagnosis not present

## 2023-06-23 DIAGNOSIS — E1169 Type 2 diabetes mellitus with other specified complication: Secondary | ICD-10-CM

## 2023-06-23 DIAGNOSIS — M9903 Segmental and somatic dysfunction of lumbar region: Secondary | ICD-10-CM | POA: Diagnosis not present

## 2023-06-23 NOTE — Progress Notes (Signed)
Subjective: CC:DM PCP: Raliegh Ip, DO [id number] [name] is a 72 y.o. female presenting to clinic today for:  1. Type 2 Diabetes with hypertension, hyperlipidemia:  Has changed diet and gotten rid of unnecessary sugars.  She is monitoring BGs 99-115, H 200, no lows.    Diabetes Health Maintenance Due  Topic Date Due   OPHTHALMOLOGY EXAM  Never done   HEMOGLOBIN A1C  09/19/2023   FOOT EXAM  06/22/2024    Last A1c:  Lab Results  Component Value Date   HGBA1C 7.5 (H) 03/19/2023    ROS: Denies dizziness, LOC, polyuria, polydipsia, unintended weight loss/gain, foot ulcerations, numbness or tingling in extremities, shortness of breath or chest pain.   ROS: Per HPI  Allergies  Allergen Reactions   Bee Venom Swelling   Crestor [Rosuvastatin] Other (See Comments)    Myalgias   Erythromycin Itching, Rash and Other (See Comments)   Rosuvastatin Calcium Other (See Comments)    Myalgias   Pravastatin Other (See Comments)    Pain in joints.   Zetia [Ezetimibe]    Aleve [Naproxen Sodium]     Gastritis   Tetracyclines & Related Other (See Comments)    unknown   Vytorin [Ezetimibe-Simvastatin] Other (See Comments)    unknown   Past Medical History:  Diagnosis Date   Allergy    Asthma    Back pain    Cancer (HCC)    skin cancer   Depression    Hyperlipidemia    Seizures (HCC) 12/14/2014   Tendinitis of left rotator cuff 07/20/2020   Transient memory loss 12/09/2014    Current Outpatient Medications:    albuterol (VENTOLIN HFA) 108 (90 Base) MCG/ACT inhaler, Inhale 2 puffs into the lungs every 6 (six) hours as needed for wheezing or shortness of breath., Disp: 1 each, Rfl: 0   allopurinol (ZYLOPRIM) 300 MG tablet, Take 1 tablet (300 mg total) by mouth every evening., Disp: 100 tablet, Rfl: 3   Ascorbic Acid (VITAMIN C PO), Take by mouth., Disp: , Rfl:    atenolol (TENORMIN) 50 MG tablet, TAKE 1 TABLET BY MOUTH DAILY, Disp: 100 tablet, Rfl: 2   BIOTIN PO, Take  by mouth., Disp: , Rfl:    Blood Glucose Monitoring Suppl (ONETOUCH VERIO FLEX SYSTEM) w/Device KIT, Use to test blood sugar daily as directed DX: E11.65, Disp: 1 kit, Rfl: 0   budesonide (PULMICORT) 180 MCG/ACT inhaler, Inhale 2 puffs into the lungs 2 (two) times daily., Disp: , Rfl:    cetirizine (ZYRTEC) 10 MG tablet, Take 1 tablet by mouth daily., Disp: , Rfl:    cholecalciferol (VITAMIN D3) 25 MCG (1000 UT) tablet, Take 1,000 Units by mouth daily., Disp: , Rfl:    esomeprazole (NEXIUM) 20 MG capsule, Take 1 capsule (20 mg total) by mouth daily at 12 noon. For GERD, Disp: 100 capsule, Rfl: 3   glucose blood (ONETOUCH VERIO) test strip, Use to test blood sugar daily as directed DX: E11.65, Disp: 200 each, Rfl: 5   Lancet Device MISC, Check BGs every morning. E11.9.May substitute to any manufacturer covered by patient's insurance., Disp: 1 each, Rfl: 0   Lancets Misc. MISC, Check BGs every morning. E11.9.May substitute to any manufacturer covered by patient's insurance., Disp: 100 each, Rfl: 3   meclizine (ANTIVERT) 25 MG tablet, Take 1 tablet (25 mg total) by mouth 3 (three) times daily as needed for dizziness., Disp: 30 tablet, Rfl: 2   montelukast (SINGULAIR) 10 MG tablet, TAKE 1 TABLET BY  MOUTH IN THE  MORNING, Disp: 100 tablet, Rfl: 2   Multiple Vitamin (MULTIVITAMIN) capsule, Take 1 capsule by mouth daily. , Disp: , Rfl:    OneTouch Delica Lancets 33G MISC, Use to test blood sugar daily as directed DX: E11.65, Disp: 200 each, Rfl: 5   rOPINIRole (REQUIP) 0.25 MG tablet, Take 1 tablet every day by oral route for 30 days., Disp: , Rfl:    sertraline (ZOLOFT) 50 MG tablet, Take 1 tablet (50 mg total) by mouth daily. Put on file, Disp: 100 tablet, Rfl: 3   famotidine (PEPCID) 20 MG tablet, Take 1 tablet (20 mg total) by mouth 2 (two) times daily for 14 days., Disp: 28 tablet, Rfl: 0 Social History   Socioeconomic History   Marital status: Married    Spouse name: Not on file   Number of  children: Not on file   Years of education: Not on file   Highest education level: Not on file  Occupational History   Not on file  Tobacco Use   Smoking status: Never   Smokeless tobacco: Never  Vaping Use   Vaping status: Never Used  Substance and Sexual Activity   Alcohol use: No   Drug use: No   Sexual activity: Not on file  Other Topics Concern   Not on file  Social History Narrative   Not on file   Social Determinants of Health   Financial Resource Strain: Low Risk  (08/02/2022)   Overall Financial Resource Strain (CARDIA)    Difficulty of Paying Living Expenses: Not hard at all  Food Insecurity: No Food Insecurity (08/02/2022)   Hunger Vital Sign    Worried About Running Out of Food in the Last Year: Never true    Ran Out of Food in the Last Year: Never true  Transportation Needs: No Transportation Needs (08/02/2022)   PRAPARE - Administrator, Civil Service (Medical): No    Lack of Transportation (Non-Medical): No  Physical Activity: Insufficiently Active (08/02/2022)   Exercise Vital Sign    Days of Exercise per Week: 3 days    Minutes of Exercise per Session: 30 min  Stress: No Stress Concern Present (08/02/2022)   [employer] of Occupational Health - Occupational Stress Questionnaire    Feeling of Stress : Not at all  Social Connections: Socially Integrated (08/02/2022)   Social Connection and Isolation Panel [NHANES]    Frequency of Communication with Friends and Family: More than three times a week    Frequency of Social Gatherings with Friends and Family: More than three times a week    Attends Religious Services: More than 4 times per year    Active Member of [employer] or Organizations: Yes    Attends Engineer, structural: More than 4 times per year    Marital Status: Married  Catering manager Violence: Not At Risk (08/02/2022)   Humiliation, Afraid, Rape, and Kick questionnaire    Fear of Current or Ex-Partner: No    Emotionally  Abused: No    Physically Abused: No    Sexually Abused: No   Family History  Problem Relation Age of Onset   Heart disease Mother    Kidney disease Mother    Breast cancer Mother    Coronary artery disease Mother        CABG at age 49   Dementia Father    Stroke Father    Stroke Maternal Grandmother    Cancer Maternal Grandfather    Cancer  Paternal Grandmother    Kidney disease Paternal Grandfather     Objective: Office vital signs reviewed. BP 130/82   Pulse 63   Temp 98.6 F (37 C)   Ht 5\' 6"  (1.676 m)   Wt 176 lb (79.8 kg)   SpO2 97%   BMI 28.41 kg/m   Physical Examination:  General: Awake, alert, well nourished, No acute distress HEENT: sclera white, MMM Cardio: regular rate and rhythm, S1S2 heard, no murmurs appreciated Pulm: clear to auscultation bilaterally, no wheezes, rhonchi or rales; normal work of breathing on room air  Diabetic Foot Exam - Simple   Simple Foot Form Diabetic Foot exam was performed with the following findings: Yes 06/23/2023  1:14 PM  Visual Inspection No deformities, no ulcerations, no other skin breakdown bilaterally: Yes Sensation Testing Intact to touch and monofilament testing bilaterally: Yes Pulse Check Posterior Tibialis and Dorsalis pulse intact bilaterally: Yes Comments      Assessment/ Plan: 72 y.o. female   Diet-controlled diabetes mellitus (HCC)  Hypertension associated with diabetes (HCC)  Hyperlipidemia associated with type 2 diabetes mellitus (HCC)  Statin-induced myositis   A1c 6.8 last month with ob. Not repeated today.  Check urine micro.  Follow up in 3-4 months for DM. Continue lifestyle modification for diabetic control.  DM foot performed.  DM eye exam set up.  BP DIET controlled. No changes  Consider statin therapy given DM diagnosis. However, has had myopathy in past  Tanya Danielson M Antiono Ettinger, DO [facility] [phone]

## 2023-06-24 LAB — MICROALBUMIN / CREATININE URINE RATIO
Creatinine, Urine: 85.1 mg/dL
Microalb/Creat Ratio: 4 mg/g{creat} (ref 0–29)
Microalbumin, Urine: 3.1 ug/mL

## 2023-06-24 NOTE — Telephone Encounter (Signed)
Patient was seen 10/7- this encounter will be closed

## 2023-06-25 ENCOUNTER — Ambulatory Visit: Payer: Medicare Other | Admitting: Nutrition

## 2023-06-26 DIAGNOSIS — M9902 Segmental and somatic dysfunction of thoracic region: Secondary | ICD-10-CM | POA: Diagnosis not present

## 2023-06-26 DIAGNOSIS — M9903 Segmental and somatic dysfunction of lumbar region: Secondary | ICD-10-CM | POA: Diagnosis not present

## 2023-06-26 DIAGNOSIS — M62830 Muscle spasm of back: Secondary | ICD-10-CM | POA: Diagnosis not present

## 2023-06-26 DIAGNOSIS — M9901 Segmental and somatic dysfunction of cervical region: Secondary | ICD-10-CM | POA: Diagnosis not present

## 2023-07-08 ENCOUNTER — Ambulatory Visit: Payer: Medicare Other

## 2023-07-15 ENCOUNTER — Telehealth (INDEPENDENT_AMBULATORY_CARE_PROVIDER_SITE_OTHER): Payer: Self-pay | Admitting: Gastroenterology

## 2023-07-15 NOTE — Telephone Encounter (Signed)
Room 1 Thanks

## 2023-07-15 NOTE — Telephone Encounter (Signed)
Who is your primary care physician: Bettey Mare  Reasons for the colonoscopy: 8 year recall  Have you had a colonoscopy before?  Yes 2016  Do you have family history of colon cancer? no  Previous colonoscopy with polyps removed? Yes 2016  Do you have a history colorectal cancer?   no  Are you diabetic? If yes, Type 1 or Type 2?    Yes type 2  Do you have a prosthetic or mechanical heart valve? no  Do you have a pacemaker/defibrillator?   no  Have you had endocarditis/atrial fibrillation? no  Have you had joint replacement within the last 12 months?  no  Do you tend to be constipated or have to use laxatives? no  Do you have any history of drugs or alchohol?  no  Do you use supplemental oxygen?  no  Have you had a stroke or heart attack within the last 6 months? no  Do you take weight loss medication?  no  For female patients: have you had a hysterectomy?  no                                     are you post menopausal?       yes                                            do you still have your menstrual cycle? no      Do you take any blood-thinning medications such as: (aspirin, warfarin, Plavix, Aggrenox)  no  If yes we need the name, milligram, dosage and who is prescribing doctor  Current Outpatient Medications on File Prior to Visit  Medication Sig Dispense Refill   albuterol (VENTOLIN HFA) 108 (90 Base) MCG/ACT inhaler Inhale 2 puffs into the lungs every 6 (six) hours as needed for wheezing or shortness of breath. 1 each 0   allopurinol (ZYLOPRIM) 300 MG tablet Take 1 tablet (300 mg total) by mouth every evening. 100 tablet 3   Ascorbic Acid (VITAMIN C PO) Take by mouth.     atenolol (TENORMIN) 50 MG tablet TAKE 1 TABLET BY MOUTH DAILY 100 tablet 2   BIOTIN PO Take by mouth.     Blood Glucose Monitoring Suppl (ONETOUCH VERIO FLEX SYSTEM) w/Device KIT Use to test blood sugar daily as directed DX: E11.65 1 kit 0   budesonide (PULMICORT) 180 MCG/ACT inhaler Inhale 2  puffs into the lungs 2 (two) times daily.     cetirizine (ZYRTEC) 10 MG tablet Take 1 tablet by mouth daily.     cholecalciferol (VITAMIN D3) 25 MCG (1000 UT) tablet Take 1,000 Units by mouth daily.     esomeprazole (NEXIUM) 20 MG capsule Take 1 capsule (20 mg total) by mouth daily at 12 noon. For GERD 100 capsule 3   glucose blood (ONETOUCH VERIO) test strip Use to test blood sugar daily as directed DX: E11.65 200 each 5   Lancet Device MISC Check BGs every morning. E11.9.May substitute to any manufacturer covered by patient's insurance. 1 each 0   Lancets Misc. MISC Check BGs every morning. E11.9.May substitute to any manufacturer covered by patient's insurance. 100 each 3   meclizine (ANTIVERT) 25 MG tablet Take 1 tablet (25 mg total) by mouth 3 (three) times daily as needed for  dizziness. 30 tablet 2   montelukast (SINGULAIR) 10 MG tablet TAKE 1 TABLET BY MOUTH IN THE  MORNING 100 tablet 2   Multiple Vitamin (MULTIVITAMIN) capsule Take 1 capsule by mouth daily.      OneTouch Delica Lancets 33G MISC Use to test blood sugar daily as directed DX: E11.65 200 each 5   rOPINIRole (REQUIP) 0.25 MG tablet Take 1 tablet every day by oral route for 30 days.     sertraline (ZOLOFT) 50 MG tablet Take 1 tablet (50 mg total) by mouth daily. Put on file 100 tablet 3   famotidine (PEPCID) 20 MG tablet Take 1 tablet (20 mg total) by mouth 2 (two) times daily for 14 days. 28 tablet 0   No current facility-administered medications on file prior to visit.    Allergies  Allergen Reactions   Bee Venom Swelling   Crestor [Rosuvastatin] Other (See Comments)    Myalgias   Erythromycin Itching, Rash and Other (See Comments)   Rosuvastatin Calcium Other (See Comments)    Myalgias   Pravastatin Other (See Comments)    Pain in joints.   Zetia [Ezetimibe]    Aleve [Naproxen Sodium]     Gastritis   Tetracyclines & Related Other (See Comments)    unknown   Vytorin [Ezetimibe-Simvastatin] Other (See Comments)     unknown     Pharmacy: [facility] Name: [facility]  Best number where you can be reached: [phone]

## 2023-07-16 NOTE — Telephone Encounter (Signed)
Left message to return call 

## 2023-07-17 ENCOUNTER — Telehealth: Payer: Self-pay | Admitting: Family Medicine

## 2023-07-31 DIAGNOSIS — M9903 Segmental and somatic dysfunction of lumbar region: Secondary | ICD-10-CM | POA: Diagnosis not present

## 2023-07-31 DIAGNOSIS — M9904 Segmental and somatic dysfunction of sacral region: Secondary | ICD-10-CM | POA: Diagnosis not present

## 2023-07-31 DIAGNOSIS — M9905 Segmental and somatic dysfunction of pelvic region: Secondary | ICD-10-CM | POA: Diagnosis not present

## 2023-07-31 DIAGNOSIS — M62830 Muscle spasm of back: Secondary | ICD-10-CM | POA: Diagnosis not present

## 2023-08-04 ENCOUNTER — Ambulatory Visit (INDEPENDENT_AMBULATORY_CARE_PROVIDER_SITE_OTHER): Payer: Medicare Other

## 2023-08-04 ENCOUNTER — Ambulatory Visit: Payer: Medicare Other

## 2023-08-04 VITALS — Ht 66.0 in | Wt 176.0 lb

## 2023-08-04 DIAGNOSIS — Z23 Encounter for immunization: Secondary | ICD-10-CM

## 2023-08-04 DIAGNOSIS — E1169 Type 2 diabetes mellitus with other specified complication: Secondary | ICD-10-CM

## 2023-08-04 DIAGNOSIS — Z0000 Encounter for general adult medical examination without abnormal findings: Secondary | ICD-10-CM

## 2023-08-04 DIAGNOSIS — E785 Hyperlipidemia, unspecified: Secondary | ICD-10-CM

## 2023-08-04 LAB — HM DIABETES EYE EXAM

## 2023-08-04 NOTE — Progress Notes (Signed)
Subjective:   [name] is a 72 y.o. female who presents for Medicare Annual (Subsequent) preventive examination.  Visit Complete: Virtual I connected with  [name] on 08/04/23 by a audio enabled telemedicine application and verified that I am speaking with the correct person using two identifiers.  Patient Location: Home  Provider Location: Home Office  I discussed the limitations of evaluation and management by telemedicine. The patient expressed understanding and agreed to proceed.  Vital Signs: Because this visit was a virtual/telehealth visit, some criteria may be missing or patient reported. Any vitals not documented were not able to be obtained and vitals that have been documented are patient reported.  Cardiac Risk Factors include: advanced age (>18men, >20 women);diabetes mellitus;hypertension;dyslipidemia     Objective:    Today's Vitals   08/04/23 1544  Weight: 176 lb (79.8 kg)  Height: 5\' 6"  (1.676 m)   Body mass index is 28.41 kg/m.     08/04/2023    4:48 PM 08/14/2022    1:49 PM 08/02/2022    3:26 PM 07/31/2021    2:45 PM 07/10/2020    4:21 PM 08/16/2018   12:20 AM 07/12/2015    9:49 AM  Advanced Directives  Does Patient Have a Medical Advance Directive? No No No No No No No  Would patient like information on creating a medical advance directive? Yes (MAU/Ambulatory/Procedural Areas - Information given) No - Patient declined No - Patient declined Yes (MAU/Ambulatory/Procedural Areas - Information given)  No - Patient declined No - patient declined information    Current Medications (verified) Outpatient Encounter Medications as of 08/04/2023  Medication Sig   albuterol (VENTOLIN HFA) 108 (90 Base) MCG/ACT inhaler Inhale 2 puffs into the lungs every 6 (six) hours as needed for wheezing or shortness of breath.   allopurinol (ZYLOPRIM) 300 MG tablet Take 1 tablet (300 mg total) by mouth every evening.   Ascorbic Acid (VITAMIN C PO) Take by mouth.    atenolol (TENORMIN) 50 MG tablet TAKE 1 TABLET BY MOUTH DAILY   BIOTIN PO Take by mouth.   Blood Glucose Monitoring Suppl (ONETOUCH VERIO FLEX SYSTEM) w/Device KIT Use to test blood sugar daily as directed DX: E11.65   budesonide (PULMICORT) 180 MCG/ACT inhaler Inhale 2 puffs into the lungs 2 (two) times daily.   cetirizine (ZYRTEC) 10 MG tablet Take 1 tablet by mouth daily.   cholecalciferol (VITAMIN D3) 25 MCG (1000 UT) tablet Take 1,000 Units by mouth daily.   esomeprazole (NEXIUM) 20 MG capsule Take 1 capsule (20 mg total) by mouth daily at 12 noon. For GERD   glucose blood (ONETOUCH VERIO) test strip Use to test blood sugar daily as directed DX: E11.65   Lancet Device MISC Check BGs every morning. E11.9.May substitute to any manufacturer covered by patient's insurance.   Lancets Misc. MISC Check BGs every morning. E11.9.May substitute to any manufacturer covered by patient's insurance.   meclizine (ANTIVERT) 25 MG tablet Take 1 tablet (25 mg total) by mouth 3 (three) times daily as needed for dizziness.   montelukast (SINGULAIR) 10 MG tablet TAKE 1 TABLET BY MOUTH IN THE  MORNING   Multiple Vitamin (MULTIVITAMIN) capsule Take 1 capsule by mouth daily.    OneTouch Delica Lancets 33G MISC Use to test blood sugar daily as directed DX: E11.65   rOPINIRole (REQUIP) 0.25 MG tablet Take 1 tablet every day by oral route for 30 days.   sertraline (ZOLOFT) 50 MG tablet Take 1 tablet (50 mg total)  by mouth daily. Put on file   famotidine (PEPCID) 20 MG tablet Take 1 tablet (20 mg total) by mouth 2 (two) times daily for 14 days.   No facility-administered encounter medications on file as of 08/04/2023.    Allergies (verified) Bee venom, Crestor [rosuvastatin], Erythromycin, Rosuvastatin calcium, Pravastatin, Zetia [ezetimibe], Aleve [naproxen sodium], Tetracyclines & related, and Vytorin [ezetimibe-simvastatin]   History: Past Medical History:  Diagnosis Date   Allergy    Asthma    Back pain     Cancer (HCC)    skin cancer   Depression    Hyperlipidemia    Seizures (HCC) 12/14/2014   Tendinitis of left rotator cuff 07/20/2020   Transient memory loss 12/09/2014   Past Surgical History:  Procedure Laterality Date   bladder mesh     COLONOSCOPY N/A 07/12/2015   Procedure: COLONOSCOPY;  Surgeon: Malissa Hippo, MD;  Location: [facility];  Service: Endoscopy;  Laterality: N/A;  1030   NM MYOCAR PERF WALL MOTION  02/2007   dipyridamole myoview; EF 70%, no wall motion abnormalities, no inducible ischemia, low risk    OVARIAN CYST REMOVAL  1987   TONSILECTOMY/ADENOIDECTOMY WITH MYRINGOTOMY  1958   TONSILLECTOMY     TRANSTHORACIC ECHOCARDIOGRAM  02/2007   RV mildly dlated; LV normal in size; mild MR with thickened MV leaflets; mild TR; mild pulm valve regurg   VAGINAL DELIVERY     x3   Family History  Problem Relation Age of Onset   Heart disease Mother    Kidney disease Mother    Breast cancer Mother    Coronary artery disease Mother        CABG at age 87   Dementia Father    Stroke Father    Stroke Maternal Grandmother    Cancer Maternal Grandfather    Cancer Paternal Grandmother    Kidney disease Paternal Grandfather    Social History   Socioeconomic History   Marital status: Married    Spouse name: Not on file   Number of children: Not on file   Years of education: Not on file   Highest education level: Not on file  Occupational History   Not on file  Tobacco Use   Smoking status: Never   Smokeless tobacco: Never  Vaping Use   Vaping status: Never Used  Substance and Sexual Activity   Alcohol use: No   Drug use: No   Sexual activity: Not on file  Other Topics Concern   Not on file  Social History Narrative   Not on file   Social Determinants of Health   Financial Resource Strain: Low Risk  (08/04/2023)   Overall Financial Resource Strain (CARDIA)    Difficulty of Paying Living Expenses: Not hard at all  Food Insecurity: No Food Insecurity  (08/04/2023)   Hunger Vital Sign    Worried About Running Out of Food in the Last Year: Never true    Ran Out of Food in the Last Year: Never true  Transportation Needs: No Transportation Needs (08/04/2023)   PRAPARE - Administrator, Civil Service (Medical): No    Lack of Transportation (Non-Medical): No  Physical Activity: Insufficiently Active (08/04/2023)   Exercise Vital Sign    Days of Exercise per Week: 3 days    Minutes of Exercise per Session: 30 min  Stress: No Stress Concern Present (08/04/2023)   [employer] of Occupational Health - Occupational Stress Questionnaire    Feeling of Stress : Not  at all  Social Connections: Socially Integrated (08/04/2023)   Social Connection and Isolation Panel [NHANES]    Frequency of Communication with Friends and Family: More than three times a week    Frequency of Social Gatherings with Friends and Family: Three times a week    Attends Religious Services: More than 4 times per year    Active Member of Clubs or Organizations: Yes    Attends Engineer, structural: More than 4 times per year    Marital Status: Married    Tobacco Counseling Counseling given: Not Answered   Clinical Intake:  Pre-visit preparation completed: Yes  Pain : No/denies pain     Diabetes: Yes CBG done?: No Did pt. bring in CBG monitor from home?: No  How often do you need to have someone help you when you read instructions, pamphlets, or other written materials from your doctor or pharmacy?: 1 - Never  Interpreter Needed?: No  Information entered by :: Kandis Fantasia LPN   Activities of Daily Living    08/04/2023    4:47 PM  In your present state of health, do you have any difficulty performing the following activities:  Hearing? 0  Vision? 0  Difficulty concentrating or making decisions? 0  Walking or climbing stairs? 0  Dressing or bathing? 0  Doing errands, shopping? 0  Preparing Food and eating ? N  Using the  Toilet? N  In the past six months, have you accidently leaked urine? N  Do you have problems with loss of bowel control? N  Managing your Medications? N  Managing your Finances? N  Housekeeping or managing your Housekeeping? N    Patient Care Team: Raliegh Ip, DO as PCP - General (Family Medicine) Rennis Golden Lisette Abu, MD as PCP - Cardiology (Cardiology) Richardean Chimera, MD as Consulting Physician (Obstetrics and Gynecology) [facility]  Indicate any recent Medical Services you may have received from other than [facility] providers in the past year (date may be approximate).     Assessment:   This is a routine wellness examination for Sravya.  Hearing/Vision screen Hearing Screening - Comments:: Denies hearing difficulties   Vision Screening - Comments:: Wears rx glasses - up to date with routine eye exams with [facility]   Goals Addressed             This Visit's Progress    Remain active and independent        Depression Screen    08/04/2023    4:43 PM 06/23/2023   11:28 AM 06/12/2023    8:06 AM 04/16/2023    9:55 AM 03/19/2023   10:05 AM 09/13/2022    3:34 PM 08/02/2022    3:24 PM  PHQ 2/9 Scores  PHQ - 2 Score 0 0 0 0 0 0 0  PHQ- 9 Score 0 0  0  0     Fall Risk    08/04/2023    4:46 PM 06/23/2023   11:28 AM 06/12/2023    8:06 AM 04/16/2023    9:54 AM 03/19/2023   10:05 AM  Fall Risk   Falls in the past year? 0 0 0 0 0  Number falls in past yr: 0 0 0 0   Injury with Fall? 0 0 0 0   Risk for fall due to : No Fall Risks No Fall Risks  No Fall Risks   Follow up Falls prevention discussed;Education provided;Falls evaluation completed Education provided  Education provided  MEDICARE RISK AT HOME: Medicare Risk at Home Any stairs in or around the home?: No If so, are there any without handrails?: No Home free of loose throw rugs in walkways, pet beds, electrical cords, etc?: Yes Adequate lighting in your home to reduce risk of falls?:  Yes Life alert?: No Use of a cane, walker or w/c?: No Grab bars in the bathroom?: Yes Shower chair or bench in shower?: No Elevated toilet seat or a handicapped toilet?: Yes  TIMED UP AND GO:  Was the test performed?  No    Cognitive Function:    07/31/2021    2:03 PM 12/09/2014    9:00 AM  MMSE - Mini Mental State Exam  Orientation to time 5 5  Orientation to Place 5 5  Registration 3 3  Attention/ Calculation 5 5  Recall 3 2  Language- name 2 objects 2 2  Language- repeat 1 1  Language- follow 3 step command 3 3  Language- read & follow direction 1 1  Write a sentence 1 1  Copy design 1 1  Total score 30 29        08/04/2023    4:48 PM 08/02/2022    3:26 PM  6CIT Screen  What Year? 0 points 0 points  What month? 0 points 0 points  What time? 0 points 0 points  Count back from 20 0 points 0 points  Months in reverse 0 points 0 points  Repeat phrase 0 points 0 points  Total Score 0 points 0 points    Immunizations Immunization History  Administered Date(s) Administered   Fluad Quad(high Dose 65+) 08/22/2017, 05/14/2019, 08/14/2020, 07/31/2021, 08/05/2022   Fluad Trivalent(High Dose 65+) 08/04/2023   Influenza, High Dose Seasonal PF 06/12/2016, 08/22/2017, 06/20/2018   Influenza, Quadrivalent, Recombinant, Inj, Pf 05/29/2019   Influenza,trivalent, recombinat, inj, PF 09/12/2014   Influenza-Unspecified 09/12/2014, 06/12/2016, 08/22/2017, 06/20/2018, 05/29/2019   Moderna Sars-Covid-2 Vaccination 11/11/2019, 12/10/2019   Pneumococcal Conjugate-13 08/15/2016   Pneumococcal Polysaccharide-23 07/13/2019   Tdap 02/08/2020   Zoster Recombinant(Shingrix) 10/14/2019, 01/14/2020   Zoster, Live 12/04/2012    TDAP status: Up to date  Flu Vaccine status: Up to date  Pneumococcal vaccine status: Up to date  Covid-19 vaccine status: Information provided on how to obtain vaccines.   Qualifies for Shingles Vaccine? Yes   Zostavax completed Yes   Shingrix  Completed?: Yes  Screening Tests Health Maintenance  Topic Date Due   OPHTHALMOLOGY EXAM  Never done   DEXA SCAN  Never done   COVID-19 Vaccine (3 - Moderna risk series) 01/07/2020   MAMMOGRAM  06/06/2023   HEMOGLOBIN A1C  09/19/2023   Diabetic kidney evaluation - eGFR measurement  03/18/2024   Diabetic kidney evaluation - Urine ACR  06/22/2024   FOOT EXAM  06/22/2024   Medicare Annual Wellness (AWV)  08/03/2024   Colonoscopy  07/11/2025   DTaP/Tdap/Td (2 - Td or Tdap) 02/07/2030   Pneumonia Vaccine 57+ Years old  Completed   INFLUENZA VACCINE  Completed   Hepatitis C Screening  Completed   Zoster Vaccines- Shingrix  Completed   HPV VACCINES  Aged Out    Health Maintenance  Health Maintenance Due  Topic Date Due   OPHTHALMOLOGY EXAM  Never done   DEXA SCAN  Never done   COVID-19 Vaccine (3 - Moderna risk series) 01/07/2020   MAMMOGRAM  06/06/2023    Colorectal cancer screening: Type of screening: Colonoscopy. Completed 07/12/15. Repeat every 10 years  Mammogram status: Completed and records  requested . Repeat every year  Bone Density status: Completed and records requested from gyn. Results reflect: Bone density results: NORMAL. Repeat every - years.  Lung Cancer Screening: (Low Dose CT Chest recommended if Age 41-80 years, 20 pack-year currently smoking OR have quit w/in 15years.) does not qualify.   Lung Cancer Screening Referral: n/a  Additional Screening:  Hepatitis C Screening: does qualify; Completed 03/19/23  Vision Screening: Recommended annual ophthalmology exams for early detection of glaucoma and other disorders of the eye. Is the patient up to date with their annual eye exam?  Yes  Who is the provider or what is the name of the office in which the patient attends annual eye exams? [facility] If pt is not established with a provider, would they like to be referred to a provider to establish care? No .   Dental Screening: Recommended annual dental exams  for proper oral hygiene  Diabetic Foot Exam: Diabetic Foot Exam: Completed 06/23/23  Community Resource Referral / Chronic Care Management: CRR required this visit?  No   CCM required this visit?  No     Plan:     I have personally reviewed and noted the following in the patient's chart:   Medical and social history Use of alcohol, tobacco or illicit drugs  Current medications and supplements including opioid prescriptions. Patient is not currently taking opioid prescriptions. Functional ability and status Nutritional status Physical activity Advanced directives List of other physicians Hospitalizations, surgeries, and ER visits in previous 12 months Vitals Screenings to include cognitive, depression, and falls Referrals and appointments  In addition, I have reviewed and discussed with patient certain preventive protocols, quality metrics, and best practice recommendations. A written personalized care plan for preventive services as well as general preventive health recommendations were provided to patient.     Kandis Fantasia [place], [place]   [id number]   After Visit Summary: (Mail) Due to this being a telephonic visit, the after visit summary with patients personalized plan was offered to patient via mail   Nurse Notes: No concerns at this time

## 2023-08-04 NOTE — Patient Instructions (Signed)
Ms. Mua , Thank you for taking time to come for your Medicare Wellness Visit. I appreciate your ongoing commitment to your health goals. Please review the following plan we discussed and let me know if I can assist you in the future.   Referrals/Orders/Follow-Ups/Clinician Recommendations: Aim for 30 minutes of exercise or brisk walking, 6-8 glasses of water, and 5 servings of fruits and vegetables each day.  This is a list of the screening recommended for you and due dates:  Health Maintenance  Topic Date Due   Eye exam for diabetics  Never done   DEXA scan (bone density measurement)  Never done   COVID-19 Vaccine (3 - Moderna risk series) 01/07/2020   Mammogram  06/06/2023   Hemoglobin A1C  09/19/2023   Yearly kidney function blood test for diabetes  03/18/2024   Yearly kidney health urinalysis for diabetes  06/22/2024   Complete foot exam   06/22/2024   Medicare Annual Wellness Visit  08/03/2024   Colon Cancer Screening  07/11/2025   DTaP/Tdap/Td vaccine (2 - Td or Tdap) 02/07/2030   Pneumonia Vaccine  Completed   Flu Shot  Completed   Hepatitis C Screening  Completed   Zoster (Shingles) Vaccine  Completed   HPV Vaccine  Aged Out    Advanced directives: (ACP Link)Information on Advanced Care Planning can be found at [facility] Advance Health Care Directives Advance Health Care Directives ([email or web address]   Next Medicare Annual Wellness Visit scheduled for next year: Yes  Insert Preventive Care attachment Insert FALL PREVENTION attachment if needed

## 2023-08-04 NOTE — Progress Notes (Unsigned)
[name] arrived 08/04/2023 and has given verbal consent to obtain images and complete their overdue diabetic retinal screening.  The images have been sent to an ophthalmologist or optometrist for review and interpretation.  Results will be sent back to Raliegh Ip, DO for review.  Patient has been informed they will be contacted when we receive the results via telephone or MyChart

## 2023-08-05 ENCOUNTER — Ambulatory Visit: Payer: Medicare Other

## 2023-08-06 NOTE — Telephone Encounter (Signed)
Pt returned call. Pt is OK waiting until January since Dr.Castaneda only slot is 09/11/23. Will call with January schedule.

## 2023-08-19 ENCOUNTER — Encounter: Payer: Self-pay | Admitting: Family Medicine

## 2023-08-27 ENCOUNTER — Encounter: Payer: Medicare Other | Attending: Family Medicine | Admitting: Nutrition

## 2023-08-27 VITALS — Ht 65.5 in | Wt 179.0 lb

## 2023-08-27 DIAGNOSIS — K760 Fatty (change of) liver, not elsewhere classified: Secondary | ICD-10-CM | POA: Insufficient documentation

## 2023-08-27 DIAGNOSIS — E1159 Type 2 diabetes mellitus with other circulatory complications: Secondary | ICD-10-CM | POA: Diagnosis not present

## 2023-08-27 DIAGNOSIS — I152 Hypertension secondary to endocrine disorders: Secondary | ICD-10-CM | POA: Diagnosis not present

## 2023-08-27 DIAGNOSIS — E669 Obesity, unspecified: Secondary | ICD-10-CM | POA: Insufficient documentation

## 2023-08-27 DIAGNOSIS — E119 Type 2 diabetes mellitus without complications: Secondary | ICD-10-CM | POA: Diagnosis not present

## 2023-08-27 DIAGNOSIS — E782 Mixed hyperlipidemia: Secondary | ICD-10-CM | POA: Diagnosis not present

## 2023-08-27 NOTE — Progress Notes (Unsigned)
Medical Nutrition Therapy  Appointment Start time:  1200  Appointment End time:  1215  Primary concerns today: Dm Type 2  Referral diagnosis: E11.8 Preferred learning style: NO Preference Learning readiness: Ready   NUTRITION ASSESSMENT  72 yr old wfemale referred for Type 2 DM. Newly Diagnosed. PCP Dr. Nadine Counts. Got A1C down to 6.8% Changes Madke: Eating bread and potatoes and sweets/ice cream.   A1C 7.3%.  Is working on changing her DM with diet and exercise and doesn't want to go on medication if possible. Hyperlipidemia noted. Not able/willing to take statins for her hyp   She is willing to work with Lifestyle Medicine and focus on whole plant based foods and the  6 pillars of health to reverse her Dm and reduce further medical problems.   Clinical Medical Hx:  Past Medical History:  Diagnosis Date   Allergy    Asthma    Back pain    Cancer (HCC)    skin cancer   Depression    Hyperlipidemia    Seizures (HCC) 12/14/2014   Tendinitis of left rotator cuff 07/20/2020   Transient memory loss 12/09/2014   Medications:  Current Outpatient Medications on File Prior to Visit  Medication Sig Dispense Refill   albuterol (VENTOLIN HFA) 108 (90 Base) MCG/ACT inhaler Inhale 2 puffs into the lungs every 6 (six) hours as needed for wheezing or shortness of breath. 1 each 0   allopurinol (ZYLOPRIM) 300 MG tablet Take 1 tablet (300 mg total) by mouth every evening. 100 tablet 3   Ascorbic Acid (VITAMIN C PO) Take by mouth.     atenolol (TENORMIN) 50 MG tablet TAKE 1 TABLET BY MOUTH DAILY 100 tablet 2   BIOTIN PO Take by mouth.     Blood Glucose Monitoring Suppl (ONETOUCH VERIO FLEX SYSTEM) w/Device KIT Use to test blood sugar daily as directed DX: E11.65 1 kit 0   budesonide (PULMICORT) 180 MCG/ACT inhaler Inhale 2 puffs into the lungs 2 (two) times daily.     cetirizine (ZYRTEC) 10 MG tablet Take 1 tablet by mouth daily.     cholecalciferol (VITAMIN D3) 25 MCG (1000 UT) tablet  Take 1,000 Units by mouth daily.     esomeprazole (NEXIUM) 20 MG capsule Take 1 capsule (20 mg total) by mouth daily at 12 noon. For GERD 100 capsule 3   famotidine (PEPCID) 20 MG tablet Take 1 tablet (20 mg total) by mouth 2 (two) times daily for 14 days. 28 tablet 0   glucose blood (ONETOUCH VERIO) test strip Use to test blood sugar daily as directed DX: E11.65 200 each 5   Lancet Device MISC Check BGs every morning. E11.9.May substitute to any manufacturer covered by patient's insurance. 1 each 0   Lancets Misc. MISC Check BGs every morning. E11.9.May substitute to any manufacturer covered by patient's insurance. 100 each 3   meclizine (ANTIVERT) 25 MG tablet Take 1 tablet (25 mg total) by mouth 3 (three) times daily as needed for dizziness. 30 tablet 2   montelukast (SINGULAIR) 10 MG tablet TAKE 1 TABLET BY MOUTH IN THE  MORNING 100 tablet 2   Multiple Vitamin (MULTIVITAMIN) capsule Take 1 capsule by mouth daily.      OneTouch Delica Lancets 33G MISC Use to test blood sugar daily as directed DX: E11.65 200 each 5   rOPINIRole (REQUIP) 0.25 MG tablet Take 1 tablet every day by oral route for 30 days.     sertraline (ZOLOFT) 50 MG tablet Take 1 tablet (  50 mg total) by mouth daily. Put on file 100 tablet 3   No current facility-administered medications on file prior to visit.    Labs:  Lab Results  Component Value Date   HGBA1C 7.5 (H) 03/19/2023      Latest Ref Rng & Units 03/19/2023   10:46 AM 09/28/2022    2:44 PM 04/23/2021   11:53 AM  CMP  Glucose 70 - 99 mg/dL 213  086  93   BUN 8 - 27 mg/dL 12  15  16    Creatinine 0.57 - 1.00 mg/dL 5.78  4.69  6.29   Sodium 134 - 144 mmol/L 137  133  143   Potassium 3.5 - 5.2 mmol/L 4.2  4.1  4.7   Chloride 96 - 106 mmol/L 101  97  104   CO2 20 - 29 mmol/L 20  24  17    Calcium 8.7 - 10.3 mg/dL 9.3  9.3  9.7   Total Protein 6.0 - 8.5 g/dL 6.5     Total Bilirubin 0.0 - 1.2 mg/dL 0.5     Alkaline Phos 44 - 121 IU/L 112     AST 0 - 40 IU/L 50      ALT 0 - 32 IU/L 53      Lipid Panel     Component Value Date/Time   CHOL 239 (H) 11/29/2022 0940   TRIG 145 11/29/2022 0940   HDL 50 11/29/2022 0940   CHOLHDL 4.8 (H) 11/29/2022 0940   LDLCALC 163 (H) 11/29/2022 0940   LABVLDL 26 11/29/2022 0940    Notable Signs/Symptoms: None  Lifestyle & Dietary Hx Married and lives with her husband. Helps care for grandkids  Estimated daily fluid intake: 30 oz Supplements: VIt C, Vit D3 Sleep: varies Stress / self-care:  Current average weekly physical activity: ADL  24-Hr Dietary Recall Eats 2-3 meals per day. Tends to eat later at night sometimes.  Estimated Energy Needs Calories: 1200 Carbohydrate: 135g Protein: 90g Fat: 33g   NUTRITION DIAGNOSIS  NB-1.1 Food and nutrition-related knowledge deficit As related to Diabetes Type 2.  As evidenced by A1C 7.4%.Marland Kitchen   NUTRITION INTERVENTION  Nutrition education (E-1) on the following topics:  Nutrition and Diabetes education provided on My Plate, CHO counting, meal planning, portion sizes, timing of meals, avoiding snacks between meals unless having a low blood sugar, target ranges for A1C and blood sugars, signs/symptoms and treatment of hyper/hypoglycemia, monitoring blood sugars, taking medications as prescribed, benefits of exercising 30 minutes per day and prevention of complications of DM.  Lifestyle Medicine  - Whole Food, Plant Predominant Nutrition is highly recommended: Eat Plenty of vegetables, Mushrooms, fruits, Legumes, Whole Grains, Nuts, seeds in lieu of processed meats, processed snacks/pastries red meat, poultry, eggs.    -It is better to avoid simple carbohydrates including: Cakes, Sweet Desserts, Ice Cream, Soda (diet and regular), Sweet Tea, Candies, Chips, Cookies, Store Bought Juices, Alcohol in Excess of  1-2 drinks a day, Lemonade,  Artificial Sweeteners, Doughnuts, Coffee Creamers, "Sugar-free" Products, etc, etc.  This is not a complete list.....  Exercise: If  you are able: 30 -60 minutes a day ,4 days a week, or 150 minutes a week.  The longer the better.  Combine stretch, strength, and aerobic activities.  If you were told in the past that you have high risk for cardiovascular diseases, you may seek evaluation by your heart doctor prior to initiating moderate to intense exercise programs.   Handouts Provided Include  Lifestyle Medicine handouts  Know your numbers  Learning Style & Readiness for Change Teaching method utilized: Visual & Auditory  Demonstrated degree of understanding via: Teach Back  Barriers to learning/adherence to lifestyle change: none  Goals Established by Pt Eat three meals per day at times discussed Focus on whole plant based foods Avoid snacks between meals. Or after supper. Drink only water Walk 30+ minutes 4-5 times per week. Get A1C 6% or less. Eat high fiber foods to bring cholesterol down.    MONITORING & EVALUATION Dietary intake, weekly physical activity, and BS in 1 month.  Next Steps  Patient is to work on following Lifestyle Medicine pillars of health.Marland Kitchen

## 2023-08-28 ENCOUNTER — Encounter: Payer: Self-pay | Admitting: Nutrition

## 2023-08-28 DIAGNOSIS — M62830 Muscle spasm of back: Secondary | ICD-10-CM | POA: Diagnosis not present

## 2023-08-28 DIAGNOSIS — M9904 Segmental and somatic dysfunction of sacral region: Secondary | ICD-10-CM | POA: Diagnosis not present

## 2023-08-28 DIAGNOSIS — M9905 Segmental and somatic dysfunction of pelvic region: Secondary | ICD-10-CM | POA: Diagnosis not present

## 2023-08-28 DIAGNOSIS — M9903 Segmental and somatic dysfunction of lumbar region: Secondary | ICD-10-CM | POA: Diagnosis not present

## 2023-08-28 NOTE — Patient Instructions (Signed)
Keep up the great job! Get in 25 grams of fiber per day Get in 150 minutes of physical activity weekly. Get A1C to 5.7% or below.

## 2023-08-28 NOTE — Telephone Encounter (Signed)
Left message to return call 

## 2023-08-31 ENCOUNTER — Other Ambulatory Visit: Payer: Self-pay | Admitting: Family Medicine

## 2023-08-31 DIAGNOSIS — Z8739 Personal history of other diseases of the musculoskeletal system and connective tissue: Secondary | ICD-10-CM

## 2023-09-04 DIAGNOSIS — M9903 Segmental and somatic dysfunction of lumbar region: Secondary | ICD-10-CM | POA: Diagnosis not present

## 2023-09-04 DIAGNOSIS — M62830 Muscle spasm of back: Secondary | ICD-10-CM | POA: Diagnosis not present

## 2023-09-04 DIAGNOSIS — M9901 Segmental and somatic dysfunction of cervical region: Secondary | ICD-10-CM | POA: Diagnosis not present

## 2023-09-04 DIAGNOSIS — M9904 Segmental and somatic dysfunction of sacral region: Secondary | ICD-10-CM | POA: Diagnosis not present

## 2023-09-20 ENCOUNTER — Other Ambulatory Visit: Payer: Self-pay | Admitting: Internal Medicine

## 2023-09-30 ENCOUNTER — Ambulatory Visit (INDEPENDENT_AMBULATORY_CARE_PROVIDER_SITE_OTHER): Payer: Medicare Other | Admitting: Family Medicine

## 2023-09-30 ENCOUNTER — Encounter: Payer: Self-pay | Admitting: Family Medicine

## 2023-09-30 VITALS — BP 112/72 | HR 66 | Temp 98.2°F | Ht 65.0 in | Wt 175.0 lb

## 2023-09-30 DIAGNOSIS — E1169 Type 2 diabetes mellitus with other specified complication: Secondary | ICD-10-CM

## 2023-09-30 DIAGNOSIS — Z91030 Bee allergy status: Secondary | ICD-10-CM | POA: Diagnosis not present

## 2023-09-30 DIAGNOSIS — E785 Hyperlipidemia, unspecified: Secondary | ICD-10-CM

## 2023-09-30 DIAGNOSIS — E119 Type 2 diabetes mellitus without complications: Secondary | ICD-10-CM | POA: Diagnosis not present

## 2023-09-30 DIAGNOSIS — J069 Acute upper respiratory infection, unspecified: Secondary | ICD-10-CM

## 2023-09-30 DIAGNOSIS — I152 Hypertension secondary to endocrine disorders: Secondary | ICD-10-CM

## 2023-09-30 DIAGNOSIS — R748 Abnormal levels of other serum enzymes: Secondary | ICD-10-CM

## 2023-09-30 DIAGNOSIS — E1159 Type 2 diabetes mellitus with other circulatory complications: Secondary | ICD-10-CM

## 2023-09-30 DIAGNOSIS — T466X5D Adverse effect of antihyperlipidemic and antiarteriosclerotic drugs, subsequent encounter: Secondary | ICD-10-CM

## 2023-09-30 DIAGNOSIS — M609 Myositis, unspecified: Secondary | ICD-10-CM | POA: Diagnosis not present

## 2023-09-30 LAB — BAYER DCA HB A1C WAIVED: HB A1C (BAYER DCA - WAIVED): 6.3 % — ABNORMAL HIGH (ref 4.8–5.6)

## 2023-09-30 MED ORDER — EPINEPHRINE 0.3 MG/0.3ML IJ SOAJ: 0.3000 mg | INTRAMUSCULAR | 0 refills | Status: AC | PRN

## 2023-09-30 NOTE — Progress Notes (Signed)
 Subjective: CC:DM PCP: Jolinda Norene [name], DO YEP:[name] is a 73 y.o. female presenting to clinic today for:  1. Type 2 Diabetes with hypertension, hyperlipidemia:  She has been checking her blood sugars daily.  She remains a very strict with diet and exercise but admits that she has had a little splurging over the holidays.  She is hopeful that she has not had any significant increase in blood sugar.  She denies any hypoglycemic episodes.  She has been compliant with Tenormin .  Diabetes Health Maintenance Due  Topic Date Due   HEMOGLOBIN A1C  09/19/2023   FOOT EXAM  06/22/2024   OPHTHALMOLOGY EXAM  08/03/2024    Last A1c:  Lab Results  Component Value Date   HGBA1C 7.5 (H) 03/19/2023    ROS: Denies dizziness, LOC, polyuria, polydipsia, unintended weight loss/gain, foot ulcerations, numbness or tingling in extremities, shortness of breath or chest pain.  2.  URI Patient reports about a 6-day history of upper respiratory illness.  She reports this started out as a sore throat but later became congestion.  She felt pretty locked up until yesterday when she use nasal saline and that allowed her to cough up some yellow sputum.  She denies any hemoptysis, shortness of breath, wheezing, fevers.  She is compliant with her Pulmicort and has not needed to use any as needed Ventolin .   ROS: Per HPI  Allergies  Allergen Reactions   Bee Venom Swelling   Crestor [Rosuvastatin] Other (See Comments)    Myalgias   Erythromycin Itching, Rash and Other (See Comments)   Rosuvastatin Calcium Other (See Comments)    Myalgias   Pravastatin  Other (See Comments)    Pain in joints.   Zetia  [Ezetimibe ]    Aleve [Naproxen Sodium]     Gastritis   Tetracyclines & Related Other (See Comments)    unknown   Vytorin [Ezetimibe -Simvastatin] Other (See Comments)    unknown   Past Medical History:  Diagnosis Date   Allergy    Asthma    Back pain    Cancer (HCC)    skin cancer    Depression    Hyperlipidemia    Seizures (HCC) 12/14/2014   Tendinitis of left rotator cuff 07/20/2020   Transient memory loss 12/09/2014    Current Outpatient Medications:    albuterol  (VENTOLIN  HFA) 108 (90 Base) MCG/ACT inhaler, Inhale 2 puffs into the lungs every 6 (six) hours as needed for wheezing or shortness of breath., Disp: 1 each, Rfl: 0   allopurinol  (ZYLOPRIM ) 300 MG tablet, TAKE 1 TABLET BY MOUTH IN THE  EVENING, Disp: 100 tablet, Rfl: 0   Ascorbic Acid (VITAMIN C PO), Take by mouth., Disp: , Rfl:    atenolol  (TENORMIN ) 50 MG tablet, TAKE 1 TABLET BY MOUTH DAILY, Disp: 100 tablet, Rfl: 0   BIOTIN PO, Take by mouth., Disp: , Rfl:    Blood Glucose Monitoring Suppl (ONETOUCH VERIO FLEX SYSTEM) w/Device KIT, Use to test blood sugar daily as directed DX: E11.65, Disp: 1 kit, Rfl: 0   budesonide (PULMICORT) 180 MCG/ACT inhaler, Inhale 2 puffs into the lungs 2 (two) times daily., Disp: , Rfl:    cetirizine (ZYRTEC) 10 MG tablet, Take 1 tablet by mouth daily., Disp: , Rfl:    cholecalciferol (VITAMIN D3) 25 MCG (1000 UT) tablet, Take 1,000 Units by mouth daily., Disp: , Rfl:    esomeprazole  (NEXIUM ) 20 MG capsule, Take 1 capsule (20 mg total) by mouth daily at 12 noon. For GERD,  Disp: 100 capsule, Rfl: 3   glucose blood (ONETOUCH VERIO) test strip, Use to test blood sugar daily as directed DX: E11.65, Disp: 200 each, Rfl: 5   Lancet Device MISC, Check BGs every morning. E11.9.May substitute to any manufacturer covered by patient's insurance., Disp: 1 each, Rfl: 0   Lancets Misc. MISC, Check BGs every morning. E11.9.May substitute to any manufacturer covered by patient's insurance., Disp: 100 each, Rfl: 3   meclizine  (ANTIVERT ) 25 MG tablet, Take 1 tablet (25 mg total) by mouth 3 (three) times daily as needed for dizziness., Disp: 30 tablet, Rfl: 2   montelukast  (SINGULAIR ) 10 MG tablet, TAKE 1 TABLET BY MOUTH IN THE  MORNING, Disp: 100 tablet, Rfl: 2   Multiple Vitamin (MULTIVITAMIN)  capsule, Take 1 capsule by mouth daily. , Disp: , Rfl:    OneTouch Delica Lancets 33G MISC, Use to test blood sugar daily as directed DX: E11.65, Disp: 200 each, Rfl: 5   rOPINIRole (REQUIP) 0.25 MG tablet, Take 1 tablet every day by oral route for 30 days., Disp: , Rfl:    sertraline  (ZOLOFT ) 50 MG tablet, Take 1 tablet (50 mg total) by mouth daily. Put on file, Disp: 100 tablet, Rfl: 3   famotidine  (PEPCID ) 20 MG tablet, Take 1 tablet (20 mg total) by mouth 2 (two) times daily for 14 days., Disp: 28 tablet, Rfl: 0 Social History   Socioeconomic History   Marital status: Married    Spouse name: Not on file   Number of children: Not on file   Years of education: Not on file   Highest education level: Not on file  Occupational History   Not on file  Tobacco Use   Smoking status: Never   Smokeless tobacco: Never  Vaping Use   Vaping status: Never Used  Substance and Sexual Activity   Alcohol use: No   Drug use: No   Sexual activity: Not on file  Other Topics Concern   Not on file  Social History Narrative   Not on file   Social Drivers of Health   Financial Resource Strain: Low Risk  (08/04/2023)   Overall Financial Resource Strain (CARDIA)    Difficulty of Paying Living Expenses: Not hard at all  Food Insecurity: No Food Insecurity (08/04/2023)   Hunger Vital Sign    Worried About Running Out of Food in the Last Year: Never true    Ran Out of Food in the Last Year: Never true  Transportation Needs: No Transportation Needs (08/04/2023)   PRAPARE - Administrator, Civil Service (Medical): No    Lack of Transportation (Non-Medical): No  Physical Activity: Insufficiently Active (08/04/2023)   Exercise Vital Sign    Days of Exercise per Week: 3 days    Minutes of Exercise per Session: 30 min  Stress: No Stress Concern Present (08/04/2023)   [employer] of Occupational Health - Occupational Stress Questionnaire    Feeling of Stress : Not at all  Social  Connections: Socially Integrated (08/04/2023)   Social Connection and Isolation Panel [NHANES]    Frequency of Communication with Friends and Family: More than three times a week    Frequency of Social Gatherings with Friends and Family: Three times a week    Attends Religious Services: More than 4 times per year    Active Member of Clubs or Organizations: Yes    Attends Banker Meetings: More than 4 times per year    Marital Status: Married  Intimate  Partner Violence: Not At Risk (08/04/2023)   Humiliation, Afraid, Rape, and Kick questionnaire    Fear of Current or Ex-Partner: No    Emotionally Abused: No    Physically Abused: No    Sexually Abused: No   Family History  Problem Relation Age of Onset   Heart disease Mother    Kidney disease Mother    Breast cancer Mother    Coronary artery disease Mother        CABG at age 17   Dementia Father    Stroke Father    Stroke Maternal Grandmother    Cancer Maternal Grandfather    Cancer Paternal Grandmother    Kidney disease Paternal Grandfather     Objective: Office vital signs reviewed. BP 112/72   Pulse 66   Temp 98.2 F (36.8 C)   Ht 5' 5 (1.651 m)   Wt 175 lb (79.4 kg)   SpO2 96%   BMI 29.12 kg/m   Physical Examination:  General: Awake, alert, well nourished, No acute distress HEENT: Normal    Neck: No masses palpated. No lymphadenopathy    Ears: Tympanic membranes intact, normal light reflex, no erythema, no bulging    Eyes: PERRLA, extraocular membranes intact, sclera white    Nose: nasal turbinates moist, clear nasal discharge    Throat: moist mucus membranes, no erythema, no tonsillar exudate.  Airway is patent Cardio: regular rate and rhythm, S1S2 heard, no murmurs appreciated Pulm: clear to auscultation bilaterally, no wheezes, rhonchi or rales; normal work of breathing on room air  Assessment/ Plan: 73 y.o. female   Type 2 diabetes mellitus with other specified complication, without  long-term current use of insulin  (HCC) - Plan: Bayer DCA Hb A1c Waived  Hyperlipidemia associated with type 2 diabetes mellitus (HCC)  Hypertension associated with diabetes (HCC)  Statin-induced myositis  Elevated liver enzymes - Plan: CMP14+EGFR  Viral URI  Bee allergy status - Plan: EPINEPHrine  (EPIPEN  2-PAK) 0.3 mg/0.3 mL IJ SOAJ injection  Sugar is under excellent dietary control with A1c of 6.3 today.  She will continue on current regimen and may follow-up in 6 months for annual physical with fasting labs.  Okay to check blood sugars once weekly unless clinical concern arises  She has history of statin induced myositis so she is not currently treated with a statin.  Will plan for fasting lipid at next visit  Blood pressure is well-controlled with current regimen.  Continue atenolol   Check liver enzymes given history of elevation  Suspect viral URI without evidence of secondary bacterial infection but I did discuss with her signs and symptoms that would suggest need for antibiotic treatment.  She will reach out to me via MyChart should she develop any of these.  In the meantime continue supportive care with OTC sinus rinses, nasal saline, Afrin if needed.  Caution rebound nasal congestion however.  I have also sent an EpiPen  for as needed use since she has a severe bee allergy.  Norene CHRISTELLA Fielding, DO [facility] [phone]

## 2023-10-01 LAB — CMP14+EGFR
ALT: 18 [IU]/L (ref 0–32)
AST: 20 [IU]/L (ref 0–40)
Albumin: 4.5 g/dL (ref 3.8–4.8)
Alkaline Phosphatase: 101 [IU]/L (ref 44–121)
BUN/Creatinine Ratio: 16 (ref 12–28)
BUN: 17 mg/dL (ref 8–27)
Bilirubin Total: 0.5 mg/dL (ref 0.0–1.2)
CO2: 21 mmol/L (ref 20–29)
Calcium: 9.6 mg/dL (ref 8.7–10.3)
Chloride: 101 mmol/L (ref 96–106)
Creatinine, Ser: 1.04 mg/dL — ABNORMAL HIGH (ref 0.57–1.00)
Globulin, Total: 2 g/dL (ref 1.5–4.5)
Glucose: 158 mg/dL — ABNORMAL HIGH (ref 70–99)
Potassium: 4.3 mmol/L (ref 3.5–5.2)
Sodium: 139 mmol/L (ref 134–144)
Total Protein: 6.5 g/dL (ref 6.0–8.5)
eGFR: 57 mL/min/{1.73_m2} — ABNORMAL LOW (ref >59)

## 2023-10-08 ENCOUNTER — Encounter: Payer: Self-pay | Admitting: Family Medicine

## 2023-10-08 ENCOUNTER — Ambulatory Visit (INDEPENDENT_AMBULATORY_CARE_PROVIDER_SITE_OTHER): Payer: Medicare Other | Admitting: Family Medicine

## 2023-10-08 VITALS — BP 129/72 | HR 69 | Temp 98.0°F | Ht 65.0 in | Wt 175.6 lb

## 2023-10-08 DIAGNOSIS — B372 Candidiasis of skin and nail: Secondary | ICD-10-CM | POA: Diagnosis not present

## 2023-10-08 DIAGNOSIS — J029 Acute pharyngitis, unspecified: Secondary | ICD-10-CM | POA: Diagnosis not present

## 2023-10-08 LAB — CULTURE, GROUP A STREP

## 2023-10-08 LAB — RAPID STREP SCREEN (MED CTR MEBANE ONLY): Strep Gp A Ag, IA W/Reflex: NEGATIVE

## 2023-10-08 MED ORDER — NYSTATIN 100000 UNIT/GM EX CREA: 1.0000 | TOPICAL_CREAM | Freq: Two times a day (BID) | CUTANEOUS | 0 refills | Status: DC

## 2023-10-08 MED ORDER — LEVOCETIRIZINE DIHYDROCHLORIDE 5 MG PO TABS: 5.0000 mg | ORAL_TABLET | Freq: Every evening | ORAL | 3 refills | Status: DC

## 2023-10-08 NOTE — Addendum Note (Signed)
Addended by: Gabriel Earing on: 10/08/2023 08:50 AM   Modules accepted: Orders

## 2023-10-08 NOTE — Progress Notes (Signed)
Acute Office Visit  Subjective:     Patient ID: [name], female    DOB: 1951/03/03, 73 y.o.   MRN: [id number]  Chief Complaint  Patient presents with   Sore Throat    Sore Throat  This is a new problem. Episode onset: 2 weeks. The problem has been gradually worsening. Neither side of throat is experiencing more pain than the other. There has been no fever. Associated symptoms include a hoarse voice and swollen glands. Pertinent negatives include no abdominal pain, ear discharge, ear pain, headaches, plugged ear sensation, shortness of breath, stridor or vomiting. She has had no exposure to strep. She has tried acetaminophen and cool liquids (throat lozenges) for the symptoms. The treatment provided no relief.   Initially started with cough and congestion as well. Cough and congestion has now resolved but sore throat has gotten worse. Throat is sore throughout the day, worsens at night. Increased pain with swallowing. Tender glands in neck.  Also has itchy ears.   Has chronic intermittent rash under left breast. Burns. No itching, exudate. Has tried steroid cream without improvement.   Review of Systems  HENT:  Positive for hoarse voice. Negative for ear discharge and ear pain.   Respiratory:  Negative for shortness of breath and stridor.   Gastrointestinal:  Negative for abdominal pain and vomiting.  Neurological:  Negative for headaches.        Objective:    BP 129/72   Pulse 69   Temp 98 F (36.7 C) (Temporal)   Ht 5\' 5"  (1.651 m)   Wt 175 lb 9.6 oz (79.7 kg)   SpO2 97%   BMI 29.22 kg/m    Physical Exam Vitals and nursing note reviewed.  Constitutional:      General: She is not in acute distress.    Appearance: She is well-developed. She is not ill-appearing, toxic-appearing or diaphoretic.  HENT:     Head: Normocephalic and atraumatic.     Right Ear: Tympanic membrane and ear canal normal.     Left Ear: Tympanic membrane and ear canal normal.     Nose: No  congestion.     Mouth/Throat:     Mouth: Mucous membranes are moist. No oral lesions.     Pharynx: Posterior oropharyngeal erythema present. No pharyngeal swelling, oropharyngeal exudate or uvula swelling.     Tonsils: No tonsillar exudate or tonsillar abscesses. 1+ on the right. 1+ on the left.  Eyes:     Conjunctiva/sclera: Conjunctivae normal.     Pupils: Pupils are equal, round, and reactive to light.  Neck:     Thyroid: No thyromegaly.  Cardiovascular:     Rate and Rhythm: Normal rate and regular rhythm.  Pulmonary:     Effort: No respiratory distress.     Breath sounds: No stridor. No wheezing.  Musculoskeletal:     Cervical back: Neck supple.  Lymphadenopathy:     Cervical: No cervical adenopathy.  Skin:    General: Skin is warm and dry.     Findings: Rash (yeast dermatitis present under left breast) present.  Neurological:     General: No focal deficit present.     Mental Status: She is alert and oriented to person, place, and time.  Psychiatric:        Mood and Affect: Mood normal.        Behavior: Behavior normal.     No results found for any visits on 10/08/23.      Assessment & Plan:  [name] was seen today for sore throat.  Diagnoses and all orders for this visit:  Sore throat Initially with URI symptoms. URI symptoms have improved but sore throat has worsened. Rapid strep negative today. Culture pending. Try xyzal.  -     Rapid Strep Screen ([facility] ONLY) -     levocetirizine (XYZAL) 5 MG tablet; Take 1 tablet (5 mg total) by mouth every evening. -     Throat culture  Yeast dermatitis Nystatin cream as below.  -     nystatin cream (MYCOSTATIN); Apply 1 Application topically 2 (two) times daily for 10 days.   Return if symptoms worsen or fail to improve.  The patient indicates understanding of these issues and agrees with the plan.  Gabriel Earing, FNP

## 2023-10-10 LAB — CULTURE, GROUP A STREP: Strep A Culture: NEGATIVE

## 2023-10-13 ENCOUNTER — Encounter: Payer: Self-pay | Admitting: Family Medicine

## 2023-10-13 DIAGNOSIS — J3489 Other specified disorders of nose and nasal sinuses: Secondary | ICD-10-CM

## 2023-10-14 MED ORDER — AZELASTINE HCL 0.1 % NA SOLN: 1.0000 | Freq: Two times a day (BID) | NASAL | 12 refills | Status: AC

## 2023-10-15 ENCOUNTER — Telehealth (INDEPENDENT_AMBULATORY_CARE_PROVIDER_SITE_OTHER): Payer: Self-pay | Admitting: Gastroenterology

## 2023-10-15 MED ORDER — PEG 3350-KCL-NA BICARB-NACL 420 G PO SOLR: 4000.0000 mL | Freq: Once | ORAL | 0 refills | Status: AC

## 2023-10-15 NOTE — Telephone Encounter (Signed)
Pt left voicemail to schedule TCS. Returned call to pt. Pt scheduled for 11/18/23 at 8:15. Instructions will be mailed once pre op has been received. (Pt needed Monday but no Mondays available). No PA needed per insurance.

## 2023-11-09 ENCOUNTER — Other Ambulatory Visit: Payer: Self-pay | Admitting: Family Medicine

## 2023-11-09 DIAGNOSIS — Z8739 Personal history of other diseases of the musculoskeletal system and connective tissue: Secondary | ICD-10-CM

## 2023-11-11 ENCOUNTER — Encounter (HOSPITAL_COMMUNITY): Payer: Self-pay

## 2023-11-11 ENCOUNTER — Encounter (HOSPITAL_COMMUNITY)
Admission: RE | Admit: 2023-11-11 | Discharge: 2023-11-11 | Disposition: A | Discharge status: Home / Self Care | Payer: Medicare Other | Source: Ambulatory Visit | Attending: Gastroenterology | Admitting: Gastroenterology

## 2023-11-11 ENCOUNTER — Other Ambulatory Visit: Payer: Self-pay

## 2023-11-11 HISTORY — DX: Gastro-esophageal reflux disease without esophagitis: K21.9

## 2023-11-17 DIAGNOSIS — R202 Paresthesia of skin: Secondary | ICD-10-CM | POA: Diagnosis not present

## 2023-11-17 DIAGNOSIS — L570 Actinic keratosis: Secondary | ICD-10-CM | POA: Diagnosis not present

## 2023-11-18 ENCOUNTER — Encounter (HOSPITAL_COMMUNITY)
Admission: RE | Disposition: A | Discharge status: Home / Self Care | Payer: Self-pay | Source: Home / Self Care | Attending: Gastroenterology

## 2023-11-18 ENCOUNTER — Ambulatory Visit (HOSPITAL_COMMUNITY)
Admission: RE | Admit: 2023-11-18 | Discharge: 2023-11-18 | Disposition: A | Discharge status: Home / Self Care | Payer: Medicare Other | Attending: Gastroenterology | Admitting: Gastroenterology

## 2023-11-18 ENCOUNTER — Encounter (HOSPITAL_COMMUNITY): Payer: Self-pay | Admitting: Gastroenterology

## 2023-11-18 ENCOUNTER — Ambulatory Visit (HOSPITAL_BASED_OUTPATIENT_CLINIC_OR_DEPARTMENT_OTHER): Admitting: Anesthesiology

## 2023-11-18 ENCOUNTER — Ambulatory Visit (HOSPITAL_COMMUNITY): Admitting: Anesthesiology

## 2023-11-18 DIAGNOSIS — Z860100 Personal history of colon polyps, unspecified: Secondary | ICD-10-CM

## 2023-11-18 DIAGNOSIS — D122 Benign neoplasm of ascending colon: Secondary | ICD-10-CM | POA: Insufficient documentation

## 2023-11-18 DIAGNOSIS — F418 Other specified anxiety disorders: Secondary | ICD-10-CM | POA: Diagnosis not present

## 2023-11-18 DIAGNOSIS — J45909 Unspecified asthma, uncomplicated: Secondary | ICD-10-CM | POA: Diagnosis not present

## 2023-11-18 DIAGNOSIS — K635 Polyp of colon: Secondary | ICD-10-CM

## 2023-11-18 DIAGNOSIS — Z860101 Personal history of adenomatous and serrated colon polyps: Secondary | ICD-10-CM | POA: Diagnosis present

## 2023-11-18 DIAGNOSIS — K219 Gastro-esophageal reflux disease without esophagitis: Secondary | ICD-10-CM | POA: Insufficient documentation

## 2023-11-18 DIAGNOSIS — G709 Myoneural disorder, unspecified: Secondary | ICD-10-CM | POA: Insufficient documentation

## 2023-11-18 DIAGNOSIS — Z1211 Encounter for screening for malignant neoplasm of colon: Secondary | ICD-10-CM | POA: Diagnosis not present

## 2023-11-18 DIAGNOSIS — Z8249 Family history of ischemic heart disease and other diseases of the circulatory system: Secondary | ICD-10-CM | POA: Insufficient documentation

## 2023-11-18 DIAGNOSIS — E119 Type 2 diabetes mellitus without complications: Secondary | ICD-10-CM | POA: Diagnosis not present

## 2023-11-18 DIAGNOSIS — I10 Essential (primary) hypertension: Secondary | ICD-10-CM | POA: Diagnosis not present

## 2023-11-18 DIAGNOSIS — D123 Benign neoplasm of transverse colon: Secondary | ICD-10-CM | POA: Insufficient documentation

## 2023-11-18 HISTORY — PX: COLONOSCOPY WITH PROPOFOL: SHX5780

## 2023-11-18 HISTORY — PX: POLYPECTOMY: SHX149

## 2023-11-18 LAB — HM COLONOSCOPY

## 2023-11-18 LAB — GLUCOSE, CAPILLARY: Glucose-Capillary: 122 mg/dL — ABNORMAL HIGH (ref 70–99)

## 2023-11-18 SURGERY — COLONOSCOPY WITH PROPOFOL
Anesthesia: General

## 2023-11-18 MED ORDER — LIDOCAINE HCL (PF) 2 % IJ SOLN
INTRAMUSCULAR | Status: DC | PRN
  Administered 2023-11-18: 50 mg via INTRADERMAL

## 2023-11-18 MED ORDER — PROPOFOL 500 MG/50ML IV EMUL
INTRAVENOUS | Status: DC | PRN
Start: 2023-11-18 — End: 2023-11-18
  Administered 2023-11-18: 150 ug/kg/min via INTRAVENOUS

## 2023-11-18 MED ORDER — PROPOFOL 10 MG/ML IV BOLUS
INTRAVENOUS | Status: DC | PRN
  Administered 2023-11-18: 100 mg via INTRAVENOUS

## 2023-11-18 MED ORDER — LACTATED RINGERS IV SOLN: INTRAVENOUS | Status: DC | PRN

## 2023-11-18 NOTE — Anesthesia Preprocedure Evaluation (Signed)
 Anesthesia Evaluation  Patient identified by MRN, date of birth, ID band Patient awake    Reviewed: Allergy & Precautions, H&P , NPO status , Patient's Chart, lab work & pertinent test results, reviewed documented beta blocker date and time   Airway Mallampati: II  TM Distance: >3 FB Neck ROM: full    Dental no notable dental hx.    Pulmonary neg pulmonary ROS, asthma    Pulmonary exam normal breath sounds clear to auscultation       Cardiovascular Exercise Tolerance: Good hypertension, negative cardio ROS  Rhythm:regular Rate:Normal     Neuro/Psych Seizures -,  PSYCHIATRIC DISORDERS Anxiety Depression     Neuromuscular disease negative neurological ROS  negative psych ROS   GI/Hepatic negative GI ROS, Neg liver ROS,GERD  ,,  Endo/Other  negative endocrine ROSdiabetes    Renal/GU negative Renal ROS  negative genitourinary   Musculoskeletal   Abdominal   Peds  Hematology negative hematology ROS (+)   Anesthesia Other Findings   Reproductive/Obstetrics negative OB ROS                             Anesthesia Physical Anesthesia Plan  ASA: 3  Anesthesia Plan: General   Post-op Pain Management:    Induction:   PONV Risk Score and Plan: Propofol infusion  Airway Management Planned:   Additional Equipment:   Intra-op Plan:   Post-operative Plan:   Informed Consent: I have reviewed the patients History and Physical, chart, labs and discussed the procedure including the risks, benefits and alternatives for the proposed anesthesia with the patient or authorized representative who has indicated his/her understanding and acceptance.     Dental Advisory Given  Plan Discussed with: CRNA  Anesthesia Plan Comments:        Anesthesia Quick Evaluation

## 2023-11-18 NOTE — Op Note (Signed)
 [facility] Patient Name: [name] Procedure Date: 11/18/2023 10:58 AM MRN: [id number] Date of Birth: 1950/10/07 Attending MD: Katrinka Blazing , , [id number] CSN: [id number] Age: 73 Admit Type: Outpatient Procedure:                Colonoscopy Indications:              Surveillance: Personal history of adenomatous                            polyps on last colonoscopy > 5 years ago Providers:                Katrinka Blazing, Crystal Page, Volanda Napoleon., Technician Referring MD:             Katrinka Blazing Medicines:                Monitored Anesthesia Care Complications:            No immediate complications. Estimated Blood Loss:     Estimated blood loss: none. Procedure:                Pre-Anesthesia Assessment:                           - Prior to the procedure, a History and Physical                            was performed, and patient medications, allergies                            and sensitivities were reviewed. The patient's                            tolerance of previous anesthesia was reviewed.                           - The risks and benefits of the procedure and the                            sedation options and risks were discussed with the                            patient. All questions were answered and informed                            consent was obtained.                           - ASA Grade Assessment: II - A patient with mild                            systemic disease.                           After obtaining informed consent, the colonoscope  was passed under direct vision. Throughout the                            procedure, the patient's blood pressure, pulse, and                            oxygen saturations were monitored continuously. The                            [id number]-[id number] ([id number]) scope was introduced through                            the anus and advanced to the the cecum,  identified                            by appendiceal orifice and ileocecal valve. The                            colonoscopy was performed without difficulty. The                            patient tolerated the procedure well. The quality                            of the bowel preparation was good. Scope In: 11:15:49 AM Scope Out: 11:40:06 AM Scope Withdrawal Time: 0 hours 16 minutes 42 seconds  Total Procedure Duration: 0 hours 24 minutes 17 seconds  Findings:      The perianal and digital rectal examinations were normal.      Two sessile polyps were found in the transverse colon and ascending       colon. The polyps were 3 to 6 mm in size. These polyps were removed with       a cold snare. Resection and retrieval were complete.      The retroflexed view of the distal rectum and anal verge was normal and       showed no anal or rectal abnormalities. Impression:               - Two 3 to 6 mm polyps in the transverse colon and                            in the ascending colon, removed with a cold snare.                            Resected and retrieved.                           - The distal rectum and anal verge are normal on                            retroflexion view. Moderate Sedation:      Per Anesthesia Care Recommendation:           - Discharge patient to home (ambulatory).                           -  Resume previous diet.                           - Repeat colonoscopy for surveillance based on                            pathology results.                           - Await pathology results. Procedure Code(s):        --- Professional ---                           [phone], Colonoscopy, flexible; with removal of                            tumor(s), polyp(s), or other lesion(s) by snare                            technique Diagnosis Code(s):        --- Professional ---                           Z86.010, Personal history of colonic polyps                           D12.3, Benign  neoplasm of transverse colon (hepatic                            flexure or splenic flexure)                           D12.2, Benign neoplasm of ascending colon CPT copyright 2022 American Medical Association. All rights reserved. The codes documented in this report are preliminary and upon coder review may  be revised to meet current compliance requirements. Katrinka Blazing, MD Katrinka Blazing,  11/18/2023 11:48:12 AM This report has been signed electronically. Number of Addenda: 0

## 2023-11-18 NOTE — Transfer of Care (Signed)
 Immediate Anesthesia Transfer of Care Note  Patient: [name]  Procedure(s) Performed: COLONOSCOPY WITH PROPOFOL POLYPECTOMY, INTESTINE  Patient Location: Short Stay  Anesthesia Type:General  Level of Consciousness: awake  Airway & Oxygen Therapy: Patient Spontanous Breathing  Post-op Assessment: Report given to RN and Post -op Vital signs reviewed and stable  Post vital signs: Reviewed and stable  Last Vitals:  Vitals Value Taken Time  BP    Temp    Pulse    Resp    SpO2      Last Pain:  Vitals:   11/18/23 1112  TempSrc:   PainSc: 0-No pain         Complications: No notable events documented.

## 2023-11-18 NOTE — Anesthesia Procedure Notes (Signed)
 Date/Time: 11/18/2023 11:11 AM  Performed by: Julian Reil, CRNAPre-anesthesia Checklist: Patient identified, Emergency Drugs available, Suction available and Patient being monitored Patient Re-evaluated:Patient Re-evaluated prior to induction Oxygen Delivery Method: Nasal cannula Induction Type: IV induction Placement Confirmation: positive ETCO2

## 2023-11-18 NOTE — Discharge Instructions (Signed)
You are being discharged to home.  Resume your previous diet.  Your physician has recommended a repeat colonoscopy for surveillance based on pathology results.  We are waiting for your pathology results.

## 2023-11-18 NOTE — H&P (Signed)
 [name] is an 73 y.o. female.   Chief Complaint: History of colonic polyps HPI: 73 year old female with past medical history of asthma, depression, GERD, hyperlipidemia, seizures, coming for history of colonic polyps.  Last colonoscopy was performed in 2016, had 1 tubular adenoma removed.  The patient denies having any complaints such as melena, hematochezia, abdominal pain or distention, change in her bowel movement consistency or frequency, no changes in weight recently.  No family history of colorectal cancer.   Past Medical History:  Diagnosis Date   Allergy    Asthma    Back pain    Depression    GERD (gastroesophageal reflux disease)    Hyperlipidemia    Seizures (HCC) 12/14/2014   Tendinitis of left rotator cuff 07/20/2020   Transient memory loss 12/09/2014    Past Surgical History:  Procedure Laterality Date   bladder mesh     COLONOSCOPY N/A 07/12/2015   Procedure: COLONOSCOPY;  Surgeon: Malissa Hippo, MD;  Location: [facility];  Service: Endoscopy;  Laterality: N/A;  1030   NM MYOCAR PERF WALL MOTION  02/15/2007   dipyridamole myoview; EF 70%, no wall motion abnormalities, no inducible ischemia, low risk    OVARIAN CYST REMOVAL Left 09/16/1985   TONSILECTOMY/ADENOIDECTOMY WITH MYRINGOTOMY  09/16/1956   TONSILLECTOMY     TRANSTHORACIC ECHOCARDIOGRAM  02/15/2007   RV mildly dlated; LV normal in size; mild MR with thickened MV leaflets; mild TR; mild pulm valve regurg   VAGINAL DELIVERY     x3    Family History  Problem Relation Age of Onset   Heart disease Mother    Kidney disease Mother    Breast cancer Mother    Coronary artery disease Mother        CABG at age 11   Dementia Father    Stroke Father    Stroke Maternal Grandmother    Cancer Maternal Grandfather    Cancer Paternal Grandmother    Kidney disease Paternal Grandfather    Social History:  reports that she has never smoked. She has never used smokeless tobacco. She reports that she does  not drink alcohol and does not use drugs.  Allergies:  Allergies  Allergen Reactions   Bee Venom Swelling   Crestor [Rosuvastatin] Other (See Comments)    Myalgias   Erythromycin Itching, Rash and Other (See Comments)   Rosuvastatin Calcium Other (See Comments)    Myalgias   Peanut-Containing Drug Products     Neck breaks out and gets really red   Pravastatin Other (See Comments)    Pain in joints.   Zetia [Ezetimibe]    Aleve [Naproxen Sodium]     Gastritis   Tetracyclines & Related Other (See Comments)    unknown   Vytorin [Ezetimibe-Simvastatin] Other (See Comments)    unknown    Medications Prior to Admission  Medication Sig Dispense Refill   allopurinol (ZYLOPRIM) 300 MG tablet TAKE 1 TABLET BY MOUTH IN THE  EVENING 100 tablet 1   Ascorbic Acid (VITAMIN C PO) Take by mouth.     atenolol (TENORMIN) 50 MG tablet TAKE 1 TABLET BY MOUTH DAILY 100 tablet 0   azelastine (ASTELIN) 0.1 % nasal spray Place 1 spray into both nostrils 2 (two) times daily. 30 mL 12   BIOTIN PO Take by mouth.     budesonide (PULMICORT) 180 MCG/ACT inhaler Inhale 2 puffs into the lungs 2 (two) times daily.     cetirizine (ZYRTEC) 10 MG tablet Take 1 tablet  by mouth daily.     cholecalciferol (VITAMIN D3) 25 MCG (1000 UT) tablet Take 1,000 Units by mouth daily.     esomeprazole (NEXIUM) 20 MG capsule Take 1 capsule (20 mg total) by mouth daily at 12 noon. For GERD 100 capsule 3   glucose blood (ONETOUCH VERIO) test strip Use to test blood sugar daily as directed DX: E11.65 200 each 5   Lancet Device MISC Check BGs every morning. E11.9.May substitute to any manufacturer covered by patient's insurance. 1 each 0   Lancets Misc. MISC Check BGs every morning. E11.9.May substitute to any manufacturer covered by patient's insurance. 100 each 3   levocetirizine (XYZAL) 5 MG tablet Take 1 tablet (5 mg total) by mouth every evening. 90 tablet 3   montelukast (SINGULAIR) 10 MG tablet TAKE 1 TABLET BY MOUTH IN THE   MORNING 100 tablet 2   Multiple Vitamin (MULTIVITAMIN) capsule Take 1 capsule by mouth daily.      OneTouch Delica Lancets 33G MISC Use to test blood sugar daily as directed DX: E11.65 200 each 5   sertraline (ZOLOFT) 50 MG tablet Take 1 tablet (50 mg total) by mouth daily. Put on file 100 tablet 3   albuterol (VENTOLIN HFA) 108 (90 Base) MCG/ACT inhaler Inhale 2 puffs into the lungs every 6 (six) hours as needed for wheezing or shortness of breath. 1 each 0   Blood Glucose Monitoring Suppl (ONETOUCH VERIO FLEX SYSTEM) w/Device KIT Use to test blood sugar daily as directed DX: E11.65 1 kit 0   EPINEPHrine (EPIPEN 2-PAK) 0.3 mg/0.3 mL IJ SOAJ injection Inject 0.3 mg into the muscle as needed for anaphylaxis (then go to ER). 1 each 0    Results for orders placed or performed during the hospital encounter of 11/18/23 (from the past 48 hours)  Glucose, capillary     Status: Abnormal   Collection Time: 11/18/23  8:55 AM  Result Value Ref Range   Glucose-Capillary 122 (H) 70 - 99 mg/dL    Comment: Glucose reference range applies only to samples taken after fasting for at least 8 hours.   No results found.  Review of Systems  All other systems reviewed and are negative.   Blood pressure (!) 153/93, pulse 60, temperature 98.5 F (36.9 C), temperature source Oral, resp. rate 18, height 5\' 5"  (1.651 m), weight 79.3 kg, SpO2 100%. Physical Exam  GENERAL: The patient is AO x3, in no acute distress. HEENT: Head is normocephalic and atraumatic. EOMI are intact. Mouth is well hydrated and without lesions. NECK: Supple. No masses LUNGS: Clear to auscultation. No presence of rhonchi/wheezing/rales. Adequate chest expansion HEART: RRR, normal s1 and s2. ABDOMEN: Soft, nontender, no guarding, no peritoneal signs, and nondistended. BS +. No masses. EXTREMITIES: Without any cyanosis, clubbing, rash, lesions or edema. NEUROLOGIC: AOx3, no focal motor deficit. SKIN: no jaundice, no  rashes  Assessment/Plan 73 year old female with past medical history of asthma, depression, GERD, hyperlipidemia, seizures, coming for history of colonic polyps.  Will proceed with colonoscopy.  Dolores Frame, MD 11/18/2023, 9:25 AM

## 2023-11-19 ENCOUNTER — Encounter (INDEPENDENT_AMBULATORY_CARE_PROVIDER_SITE_OTHER): Payer: Self-pay | Admitting: *Deleted

## 2023-11-19 ENCOUNTER — Encounter (HOSPITAL_COMMUNITY): Payer: Self-pay | Admitting: Gastroenterology

## 2023-11-19 LAB — SURGICAL PATHOLOGY

## 2023-11-22 NOTE — Anesthesia Postprocedure Evaluation (Signed)
 Anesthesia Post Note  Patient: [name]  Procedure(s) Performed: COLONOSCOPY WITH PROPOFOL POLYPECTOMY, INTESTINE  Patient location during evaluation: PACU Anesthesia Type: General Level of consciousness: awake and alert Pain management: pain level controlled Vital Signs Assessment: post-procedure vital signs reviewed and stable Respiratory status: spontaneous breathing, nonlabored ventilation, respiratory function stable and patient connected to nasal cannula oxygen Cardiovascular status: blood pressure returned to baseline and stable Postop Assessment: no apparent nausea or vomiting Anesthetic complications: no   No notable events documented.   Last Vitals:  Vitals:   11/18/23 0911 11/18/23 1144  BP: (!) 153/93 113/75  Pulse: 60 (!) 59  Resp: 18 15  Temp: 36.9 C (!) 36.4 C  SpO2: 100% 98%    Last Pain:  Vitals:   11/19/23 1342  TempSrc:   PainSc: 0-No pain                 Windell Norfolk

## 2023-11-24 ENCOUNTER — Encounter (INDEPENDENT_AMBULATORY_CARE_PROVIDER_SITE_OTHER): Payer: Self-pay | Admitting: *Deleted

## 2023-11-29 ENCOUNTER — Other Ambulatory Visit: Payer: Self-pay | Admitting: Internal Medicine

## 2023-12-04 DIAGNOSIS — M9901 Segmental and somatic dysfunction of cervical region: Secondary | ICD-10-CM | POA: Diagnosis not present

## 2023-12-04 DIAGNOSIS — M9902 Segmental and somatic dysfunction of thoracic region: Secondary | ICD-10-CM | POA: Diagnosis not present

## 2023-12-04 DIAGNOSIS — M9903 Segmental and somatic dysfunction of lumbar region: Secondary | ICD-10-CM | POA: Diagnosis not present

## 2023-12-04 DIAGNOSIS — M62830 Muscle spasm of back: Secondary | ICD-10-CM | POA: Diagnosis not present

## 2023-12-15 DIAGNOSIS — M9901 Segmental and somatic dysfunction of cervical region: Secondary | ICD-10-CM | POA: Diagnosis not present

## 2023-12-15 DIAGNOSIS — M62830 Muscle spasm of back: Secondary | ICD-10-CM | POA: Diagnosis not present

## 2023-12-15 DIAGNOSIS — M9903 Segmental and somatic dysfunction of lumbar region: Secondary | ICD-10-CM | POA: Diagnosis not present

## 2023-12-15 DIAGNOSIS — M9902 Segmental and somatic dysfunction of thoracic region: Secondary | ICD-10-CM | POA: Diagnosis not present

## 2023-12-24 ENCOUNTER — Other Ambulatory Visit: Payer: Self-pay | Admitting: Family Medicine

## 2023-12-29 ENCOUNTER — Encounter: Payer: Medicare Other | Attending: Family Medicine | Admitting: Nutrition

## 2023-12-29 VITALS — Ht 65.5 in | Wt 179.8 lb

## 2023-12-29 DIAGNOSIS — E119 Type 2 diabetes mellitus without complications: Secondary | ICD-10-CM | POA: Insufficient documentation

## 2023-12-29 DIAGNOSIS — E669 Obesity, unspecified: Secondary | ICD-10-CM | POA: Insufficient documentation

## 2023-12-29 DIAGNOSIS — E1159 Type 2 diabetes mellitus with other circulatory complications: Secondary | ICD-10-CM | POA: Diagnosis not present

## 2023-12-29 DIAGNOSIS — I152 Hypertension secondary to endocrine disorders: Secondary | ICD-10-CM | POA: Diagnosis not present

## 2023-12-29 DIAGNOSIS — E782 Mixed hyperlipidemia: Secondary | ICD-10-CM | POA: Insufficient documentation

## 2023-12-29 DIAGNOSIS — K760 Fatty (change of) liver, not elsewhere classified: Secondary | ICD-10-CM | POA: Diagnosis not present

## 2023-12-29 NOTE — Patient Instructions (Addendum)
 Keep great job. Keep focusing on whole plant based foods Exercise 150-250 minutes per week.

## 2023-12-29 NOTE — Progress Notes (Signed)
 Medical Nutrition Therapy  Appointment Start time:  1200  Appointment End time:  1215  Primary concerns today: Dm Type 2  Referral diagnosis: E11.8 Preferred learning style: NO Preference Learning readiness: Ready   NUTRITION ASSESSMENT  A1C 6.3% Has been avoiding sweets. Eating more vegetables, fruit and baked and grilled foods Cut out red meat. Drinking only water  now. Feels better.  Changes Made: Cut out eating bread, potatoes and sweets/ice cream. Feels better. Has been trying to eat more fruits, vegetables and high fiber foods.  Trying to get in some exercise.  Goals set previously. Eat three meals per day at times discussed-done Focus on whole plant based foods-working on it Avoid snacks between meals. Or after supper.-done Drink only water -done Walk 30+ minutes 4-5 times per week.-working on it Get A1C 6% or less.- 6.8%-- making progress Eat high fiber foods to bring cholesterol down.- work in progress   She is willing to work with Lifestyle Medicine and focus on whole plant based foods and the  6 pillars of health to reverse her Dm and reduce further medical problems.    Clinical Medical Hx:  Past Medical History:  Diagnosis Date   Allergy    Asthma    Back pain    Depression    GERD (gastroesophageal reflux disease)    Hyperlipidemia    Seizures (HCC) 12/14/2014   Tendinitis of left rotator cuff 07/20/2020   Transient memory loss 12/09/2014   Medications:  Current Outpatient Medications on File Prior to Visit  Medication Sig Dispense Refill   albuterol  (VENTOLIN  HFA) 108 (90 Base) MCG/ACT inhaler Inhale 2 puffs into the lungs every 6 (six) hours as needed for wheezing or shortness of breath. 1 each 0   allopurinol  (ZYLOPRIM ) 300 MG tablet TAKE 1 TABLET BY MOUTH IN THE  EVENING 100 tablet 1   Ascorbic Acid (VITAMIN C PO) Take by mouth.     atenolol  (TENORMIN ) 50 MG tablet TAKE 1 TABLET BY MOUTH DAILY 90 tablet 0   azelastine  (ASTELIN ) 0.1 % nasal spray  Place 1 spray into both nostrils 2 (two) times daily. 30 mL 12   BIOTIN PO Take by mouth.     Blood Glucose Monitoring Suppl (ONETOUCH VERIO FLEX SYSTEM) w/Device KIT Use to test blood sugar daily as directed DX: E11.65 1 kit 0   budesonide (PULMICORT) 180 MCG/ACT inhaler Inhale 2 puffs into the lungs 2 (two) times daily.     cetirizine (ZYRTEC) 10 MG tablet Take 1 tablet by mouth daily.     cholecalciferol (VITAMIN D3) 25 MCG (1000 UT) tablet Take 1,000 Units by mouth daily.     EPINEPHrine  (EPIPEN  2-PAK) 0.3 mg/0.3 mL IJ SOAJ injection Inject 0.3 mg into the muscle as needed for anaphylaxis (then go to ER). 1 each 0   esomeprazole  (NEXIUM ) 20 MG capsule Take 1 capsule (20 mg total) by mouth daily at 12 noon. For GERD 100 capsule 3   glucose blood (ONETOUCH VERIO) test strip Use to test blood sugar daily as directed DX: E11.65 200 each 5   Lancet Device MISC Check BGs every morning. E11.9.May substitute to any manufacturer covered by patient's insurance. 1 each 0   Lancets Misc. MISC Check BGs every morning. E11.9.May substitute to any manufacturer covered by patient's insurance. 100 each 3   levocetirizine (XYZAL ) 5 MG tablet Take 1 tablet (5 mg total) by mouth every evening. 90 tablet 3   montelukast  (SINGULAIR ) 10 MG tablet TAKE 1 TABLET BY MOUTH IN THE  MORNING 100 tablet 2   Multiple Vitamin (MULTIVITAMIN) capsule Take 1 capsule by mouth daily.      OneTouch Delica Lancets 33G MISC Use to test blood sugar daily as directed DX: E11.65 200 each 5   sertraline  (ZOLOFT ) 50 MG tablet Take 1 tablet (50 mg total) by mouth daily. Put on file 100 tablet 3   No current facility-administered medications on file prior to visit.    Labs:  Lab Results  Component Value Date   HGBA1C 6.3 (H) 09/30/2023      Latest Ref Rng & Units 09/30/2023   11:15 AM 03/19/2023   10:46 AM 09/28/2022    2:44 PM  CMP  Glucose 70 - 99 mg/dL 811  914  782   BUN 8 - 27 mg/dL 17  12  15    Creatinine 0.57 - 1.00 mg/dL  9.56  2.13  0.86   Sodium 134 - 144 mmol/L 139  137  133   Potassium 3.5 - 5.2 mmol/L 4.3  4.2  4.1   Chloride 96 - 106 mmol/L 101  101  97   CO2 20 - 29 mmol/L 21  20  24    Calcium 8.7 - 10.3 mg/dL 9.6  9.3  9.3   Total Protein 6.0 - 8.5 g/dL 6.5  6.5    Total Bilirubin 0.0 - 1.2 mg/dL 0.5  0.5    Alkaline Phos 44 - 121 IU/L 101  112    AST 0 - 40 IU/L 20  50    ALT 0 - 32 IU/L 18  53     Lipid Panel     Component Value Date/Time   CHOL 239 (H) 11/29/2022 0940   TRIG 145 11/29/2022 0940   HDL 50 11/29/2022 0940   CHOLHDL 4.8 (H) 11/29/2022 0940   LDLCALC 163 (H) 11/29/2022 0940   LABVLDL 26 11/29/2022 0940    Notable Signs/Symptoms: None  Lifestyle & Dietary Hx Married and lives with her husband. Helps care for grandkids  Estimated daily fluid intake: 30 oz Supplements: VIt C, Vit D3 Sleep: varies Stress / self-care:  Current average weekly physical activity: ADL  24-Hr Dietary Recall Eats 2-3 meals per day. Tends to eat later at night sometimes.  Estimated Energy Needs Calories: 1200 Carbohydrate: 135g Protein: 90g Fat: 33g   NUTRITION DIAGNOSIS  NB-1.1 Food and nutrition-related knowledge deficit As related to Diabetes Type 2.  As evidenced by A1C 7.4%.Aaron Aas   NUTRITION INTERVENTION  Nutrition education (E-1) on the following topics:  Nutrition and Diabetes education provided on My Plate, CHO counting, meal planning, portion sizes, timing of meals, avoiding snacks between meals unless having a low blood sugar, target ranges for A1C and blood sugars, signs/symptoms and treatment of hyper/hypoglycemia, monitoring blood sugars, taking medications as prescribed, benefits of exercising 30 minutes per day and prevention of complications of DM.  Lifestyle Medicine  - Whole Food, Plant Predominant Nutrition is highly recommended: Eat Plenty of vegetables, Mushrooms, fruits, Legumes, Whole Grains, Nuts, seeds in lieu of processed meats, processed snacks/pastries red meat,  poultry, eggs.    -It is better to avoid simple carbohydrates including: Cakes, Sweet Desserts, Ice Cream, Soda (diet and regular), Sweet Tea, Candies, Chips, Cookies, Store Bought Juices, Alcohol in Excess of  1-2 drinks a day, Lemonade,  Artificial Sweeteners, Doughnuts, Coffee Creamers, "Sugar-free" Products, etc, etc.  This is not a complete list.....  Exercise: If you are able: 30 -60 minutes a day ,4 days a week, or 150 minutes a  week.  The longer the better.  Combine stretch, strength, and aerobic activities.  If you were told in the past that you have high risk for cardiovascular diseases, you may seek evaluation by your heart doctor prior to initiating moderate to intense exercise programs.  Goals Keep up the great job! Get in 25 grams of fiber per day Get in 150 minutes of physical activity weekly. Get A1C to 5.7% or below.  Handouts Provided Include  Lifestyle Medicine handouts Know your numbers  Learning Style & Readiness for Change Teaching method utilized: Visual & Auditory  Demonstrated degree of understanding via: Teach Back  Barriers to learning/adherence to lifestyle change: none  Goals EstablisheKeep great job. Keep focusing on whole plant based foods Exercise 150-250 minutes per week.d by Pt Keep great job.  MONITORING & EVALUATION Dietary intake, weekly physical activity, and BS in 3-4 month.  Next Steps  Patient is to work on following Lifestyle Medicine pillars of health.Aaron Aas

## 2024-01-06 ENCOUNTER — Encounter: Payer: Self-pay | Admitting: Nutrition

## 2024-01-15 ENCOUNTER — Other Ambulatory Visit (HOSPITAL_BASED_OUTPATIENT_CLINIC_OR_DEPARTMENT_OTHER): Payer: Self-pay | Admitting: General Practice

## 2024-01-15 DIAGNOSIS — E782 Mixed hyperlipidemia: Secondary | ICD-10-CM | POA: Diagnosis not present

## 2024-01-15 DIAGNOSIS — I700 Atherosclerosis of aorta: Secondary | ICD-10-CM | POA: Diagnosis not present

## 2024-01-16 LAB — LIPID PANEL
Chol/HDL Ratio: 4.4 ratio (ref 0.0–4.4)
Cholesterol, Total: 200 mg/dL — ABNORMAL HIGH (ref 100–199)
HDL: 45 mg/dL (ref >39)
LDL Chol Calc (NIH): 122 mg/dL — ABNORMAL HIGH (ref 0–99)
Triglycerides: 189 mg/dL — ABNORMAL HIGH (ref 0–149)
VLDL Cholesterol Cal: 33 mg/dL (ref 5–40)

## 2024-01-19 ENCOUNTER — Encounter: Payer: Self-pay | Admitting: *Deleted

## 2024-01-21 ENCOUNTER — Telehealth: Payer: Self-pay | Admitting: Pharmacy Technician

## 2024-01-21 ENCOUNTER — Encounter: Payer: Self-pay | Admitting: Pharmacy Technician

## 2024-01-21 ENCOUNTER — Encounter: Payer: Self-pay | Admitting: Internal Medicine

## 2024-01-21 ENCOUNTER — Other Ambulatory Visit (HOSPITAL_COMMUNITY): Payer: Self-pay

## 2024-01-21 ENCOUNTER — Ambulatory Visit: Payer: Medicare Other | Attending: Internal Medicine | Admitting: Internal Medicine

## 2024-01-21 VITALS — BP 142/76 | HR 62 | Ht 65.5 in | Wt 180.2 lb

## 2024-01-21 DIAGNOSIS — E782 Mixed hyperlipidemia: Secondary | ICD-10-CM

## 2024-01-21 DIAGNOSIS — M791 Myalgia, unspecified site: Secondary | ICD-10-CM

## 2024-01-21 DIAGNOSIS — E785 Hyperlipidemia, unspecified: Secondary | ICD-10-CM | POA: Diagnosis not present

## 2024-01-21 DIAGNOSIS — T466X5D Adverse effect of antihyperlipidemic and antiarteriosclerotic drugs, subsequent encounter: Secondary | ICD-10-CM | POA: Diagnosis not present

## 2024-01-21 DIAGNOSIS — I152 Hypertension secondary to endocrine disorders: Secondary | ICD-10-CM | POA: Diagnosis not present

## 2024-01-21 DIAGNOSIS — E1159 Type 2 diabetes mellitus with other circulatory complications: Secondary | ICD-10-CM | POA: Diagnosis not present

## 2024-01-21 MED ORDER — NEXLETOL 180 MG PO TABS: 180.0000 mg | ORAL_TABLET | Freq: Every day | ORAL | 11 refills | Status: AC

## 2024-01-21 MED ORDER — NEXLETOL 180 MG PO TABS: 180.0000 mg | ORAL_TABLET | Freq: Every day | ORAL | 0 refills | Status: DC

## 2024-01-21 NOTE — Addendum Note (Signed)
 Addended by: Bebe Bourdon on: 01/21/2024 09:39 AM   Modules accepted: Orders

## 2024-01-21 NOTE — Telephone Encounter (Signed)
 Pharmacy Patient Advocate Encounter   Received notification from CoverMyMeds that prior authorization for nexletol is required/requested.   Insurance verification completed.   The patient is insured through [facility] .   Per test claim: PA required; PA submitted to above mentioned insurance via CoverMyMeds Key/confirmation #/EOC [facility] Status is pending

## 2024-01-21 NOTE — Telephone Encounter (Signed)
 Pharmacy Patient Advocate Encounter  Received notification from [employer] that Prior Authorization for NEXLETOL has been APPROVED from 01/21/24 to 07/23/24. Spoke to pharmacy to process.Copay is $221 for 30 days.    PA #/Case ID/Reference #: [id number]    I got her a healthwell grant now free-sent mychart and called pt

## 2024-01-21 NOTE — Telephone Encounter (Signed)
 Patient Advocate Encounter   The patient was approved for a [employer] grant that will help cover the cost of nexletol Total amount awarded, 2500.  Effective: 12/22/23 - 12/20/24   ZOX:[id number] WUJ:[id number] WGNFA:[id number] IO:[id number] [employer] ID: [id number]   Pharmacy provided with approval and processing information. Patient informed via mychart and phone

## 2024-01-21 NOTE — Progress Notes (Signed)
 OFFICE NOTE  Chief Complaint:  Routine follow-up  Primary Care Physician: Tanya Guerin, DO  HPI:  [name] is a 73 y.o. female Who I previously seen in 2011 for palpitations and dyslipidemia. She is a former patient of Dr. Junette Simmons. Her mother [name] was a patient of mine who died recently. I have not seen her since 2011 although she had an appointment scheduled in 2015 which was canceled. Recently she's noted that she's had some elevated blood pressures. She was preparing for a beach trip and had some chest discomfort. This lasted for 2 or 3 hours and was described as a vertical line down the center of her chest. It was more of a dull pressure and resolved spontaneously. She's not had any exertional symptoms however is limited by a pinched nerve in her low back. She did have a cane for which she used to walk today. She does not exercise regularly. She also mentioned an episode where she had about 8 hours of difficulty recalling names and some memory loss after a traumatic event shortly after her mother died. She had an extensive workup for this including an MRI of the brain and ultimately no clear cause was found. She reports very good control of her her palpitations.  11/21/2017  [name] returns today for follow-up.  Overall she is doing well.  She denies any chest pain or worsening shortness of breath.  Previously I had added chlorthalidone  to her atenolol .  She is doing well with that and reports blood pressures in the 120s and 130 systolic at home.  Recently she had developed a cyst on her left middle finger.  She had surgery for that this week.  She has reported some fatigue and wonders whether or not that may be related to a B12 deficiency.  There are apparently some reports that B12 deficiency can lead to cysts such as when she had on her hand.  Additionally, we discussed her cholesterol today.  In the past she has been intolerant to rosuvastatin and Vytorin.  She was  placed on pravastatin  by me for elevated LDL cholesterol close to 190.  She had a marked reduction in total cholesterol on it however may have had side effects.  She discontinued it over a year ago and is not restarted.  She has no known atherosclerotic cardiovascular disease.  In 2016 she had carotid Dopplers which were negative in the setting of a TIA.  The etiology of the TIA is not clear.  03/16/2018  [name] returns today for follow-up.  She underwent coronary artery calcium scoring which was reported as 0, however she was found to have a small subcentimeter nodule which will need follow-up with a repeat CT scan in a year.  Overall she is doing well.  She was confused and got her lipid profile a month earlier than her appointment.  At lipid profile showed total cholesterol 198, triglycerides 178, HDL 48 and LDL 114.  She therefore has had improvement on pravastatin .  We discussed whether or not she actually needs to be on statin therapy, interestingly since she has a 0 calcium score.  Her ten-year risk is actually quite low however one considers her 30-year risk which is reasonable given her age in the mid 72s, would argue for statin therapy provided her diet is optimized.  She seems to be tolerating pravastatin  without side effects.  05/12/2019  [name] is seen today in routine follow-up.  Overall she seems to  be doing well.  She has been taking pravastatin  without any side effects.  She is noted a marked improvement in her lipid profile.  Total cholesterol is now 204, triglycerides 142, HDL 48 and LDL 128.  We had suggested a goal LDL less than 100.  Although her LDL is a little higher, this may represent recent dietary changes.  Her calcium score was 0 suggesting low risk of cardiovascular events.  Blood pressure is well controlled today.  05/11/2020  [name] is seen today in follow-up.  Overall she seems to be doing well.  Most recently she had a lipid profile showing total cholesterol 187,  triglycerides 117, HDL 46 and LDL 116.  She has been intolerant in the past to Crestor and Vytorin both causing myalgias.  Currently she is on pravastatin  40 mg.  EKG shows normal sinus rhythm.  She denies chest pain or shortness of breath.  As mentioned above she had a low calcium score of 0.  I would recommend a target LDL less than 100.  12/12/2020  [name] is seen today in follow-up.  Overall she seems to be doing well.  Blood pressure was well controlled.  EKG shows a sinus bradycardia.  She had labs in July of last year showing total cholesterol 183, HDL 46, LDL 116 and triglycerides 161, slightly higher than ideal.  Her cholesterol was then repeated just last week showing total cholesterol 238, triglycerides 201, HDL 44 and LDL 157.  She is now not currently on statin therapy due to side effects.  She could also not tolerate ezetimibe .  05/29/2021  [name] returns today for follow-up.  She is without any new complaints.  Unfortunately she has been intolerant to statins.  Her cholesterol is a little bit higher now with total 253, HDL 50 LDL 165 and triglycerides 096.  She had no coronary calcium in 2019.  She wants to continue to work on diet and activities and exercise since she is recently retired.  We talked about possible over-the-counter options including red yeast rice although cautioned her it may cause side effects as previous.  We also had previously tried ezetimibe  but she did not tolerate that.  EKG shows normal sinus rhythm today.  She had recently had some issues with dizziness and was found to be hypokalemic.  Her chlorthalidone  was discontinued.  She is remains on atenolol .  01/21/2024  [name] is seen today in follow-up.  She was last seen by Tanya Pray, NP in 2024.  At the time her cholesterol was running high.  They discussed alternatives to statin since she has intolerance but she was not interested at the time.  In addition her blood pressure was somewhat elevated.  She  returns today remains asymptomatic.  Blood pressure is elevated again 142/76.  She has not Consistent home readings.  She did have recent repeat lipids including a total cholesterol 200, triglycerides 189, HDL 45 and LDL 122 which is somewhat better than it had been last year although her triglycerides are higher.  She had no coronary calcium in 2019 but tells me she was recently diagnosed with diabetes and A1c of 6.5% which is diet controlled.  PMHx:  Past Medical History:  Diagnosis Date   Allergy    Asthma    Back pain    Depression    GERD (gastroesophageal reflux disease)    Hyperlipidemia    Seizures (HCC) 12/14/2014   Tendinitis of left rotator cuff 07/20/2020   Transient memory loss  12/09/2014    Past Surgical History:  Procedure Laterality Date   bladder mesh     COLONOSCOPY N/A 07/12/2015   Procedure: COLONOSCOPY;  Surgeon: Ruby Corporal, MD;  [facility];  Service: Endoscopy;  Laterality: N/A;  1030   COLONOSCOPY WITH PROPOFOL  N/A 11/18/2023   Procedure: COLONOSCOPY WITH PROPOFOL ;  Surgeon: Urban Garden, MD;  Location: [facility];  Service: Gastroenterology;  Laterality: N/A;  8:15AM;ASA 1   NM MYOCAR PERF WALL MOTION  02/15/2007   dipyridamole myoview; EF 70%, no wall motion abnormalities, no inducible ischemia, low risk    OVARIAN CYST REMOVAL Left 09/16/1985   POLYPECTOMY  11/18/2023   Procedure: POLYPECTOMY, INTESTINE;  Surgeon: Umberto Ganong, Bearl Limes, MD;  Location: [facility];  Service: Gastroenterology;;   TONSILECTOMY/ADENOIDECTOMY WITH MYRINGOTOMY  09/16/1956   TONSILLECTOMY     TRANSTHORACIC ECHOCARDIOGRAM  02/15/2007   RV mildly dlated; LV normal in size; mild MR with thickened MV leaflets; mild TR; mild pulm valve regurg   VAGINAL DELIVERY     x3    FAMHx:  Family History  Problem Relation Age of Onset   Heart disease Mother    Kidney disease Mother    Breast cancer Mother    Coronary artery disease Mother        CABG  at age 88   Dementia Father    Stroke Father    Stroke Maternal Grandmother    Cancer Maternal Grandfather    Cancer Paternal Grandmother    Kidney disease Paternal Grandfather     SOCHx:   reports that she has never smoked. She has never used smokeless tobacco. She reports that she does not drink alcohol and does not use drugs.  ALLERGIES:  Allergies  Allergen Reactions   Bee Venom Swelling   Crestor [Rosuvastatin] Other (See Comments)    Myalgias   Erythromycin Itching, Rash and Other (See Comments)   Rosuvastatin Calcium Other (See Comments)    Myalgias   Peanut-Containing Drug Products     Neck breaks out and gets really red   Pravastatin  Other (See Comments)    Pain in joints.   Zetia  [Ezetimibe ]    Aleve [Naproxen Sodium]     Gastritis   Tetracyclines & Related Other (See Comments)    unknown   Vytorin [Ezetimibe -Simvastatin] Other (See Comments)    unknown    ROS: Pertinent items noted in HPI and remainder of comprehensive ROS otherwise negative.  HOME MEDS: Current Outpatient Medications on File Prior to Visit  Medication Sig Dispense Refill   albuterol  (VENTOLIN  HFA) 108 (90 Base) MCG/ACT inhaler Inhale 2 puffs into the lungs every 6 (six) hours as needed for wheezing or shortness of breath. 1 each 0   allopurinol  (ZYLOPRIM ) 300 MG tablet TAKE 1 TABLET BY MOUTH IN THE  EVENING 100 tablet 1   Ascorbic Acid (VITAMIN C PO) Take by mouth.     atenolol  (TENORMIN ) 50 MG tablet TAKE 1 TABLET BY MOUTH DAILY 90 tablet 0   azelastine  (ASTELIN ) 0.1 % nasal spray Place 1 spray into both nostrils 2 (two) times daily. 30 mL 12   BIOTIN PO Take by mouth.     Blood Glucose Monitoring Suppl (ONETOUCH VERIO FLEX SYSTEM) w/Device KIT Use to test blood sugar daily as directed DX: E11.65 1 kit 0   budesonide (PULMICORT) 180 MCG/ACT inhaler Inhale 2 puffs into the lungs 2 (two) times daily.     cetirizine (ZYRTEC) 10 MG tablet Take 1 tablet  by mouth daily.     cholecalciferol  (VITAMIN D3) 25 MCG (1000 UT) tablet Take 1,000 Units by mouth daily.     EPINEPHrine  (EPIPEN  2-PAK) 0.3 mg/0.3 mL IJ SOAJ injection Inject 0.3 mg into the muscle as needed for anaphylaxis (then go to ER). 1 each 0   esomeprazole  (NEXIUM ) 20 MG capsule Take 1 capsule (20 mg total) by mouth daily at 12 noon. For GERD 100 capsule 3   glucose blood (ONETOUCH VERIO) test strip Use to test blood sugar daily as directed DX: E11.65 200 each 5   Lancet Device MISC Check BGs every morning. E11.9.May substitute to any manufacturer covered by patient's insurance. 1 each 0   Lancets Misc. MISC Check BGs every morning. E11.9.May substitute to any manufacturer covered by patient's insurance. 100 each 3   levocetirizine (XYZAL ) 5 MG tablet Take 1 tablet (5 mg total) by mouth every evening. 90 tablet 3   montelukast  (SINGULAIR ) 10 MG tablet TAKE 1 TABLET BY MOUTH IN THE  MORNING 100 tablet 2   Multiple Vitamin (MULTIVITAMIN) capsule Take 1 capsule by mouth daily.      OneTouch Delica Lancets 33G MISC Use to test blood sugar daily as directed DX: E11.65 200 each 5   sertraline  (ZOLOFT ) 50 MG tablet Take 1 tablet (50 mg total) by mouth daily. Put on file 100 tablet 3   No current facility-administered medications on file prior to visit.    LABS/IMAGING: No results found for this or any previous visit (from the past 48 hours). No results found.  WEIGHTS: Wt Readings from Last 3 Encounters:  01/21/24 180 lb 3.2 oz (81.7 kg)  12/29/23 179 lb 12.8 oz (81.6 kg)  11/18/23 174 lb 13.2 oz (79.3 kg)    VITALS: BP (!) 142/76 (BP Location: Right Arm, Patient Position: Sitting)   Pulse 62   Ht 5' 5.5" (1.664 m)   Wt 180 lb 3.2 oz (81.7 kg)   SpO2 97%   BMI 29.53 kg/m   EXAM: General appearance: alert and no distress Neck: no carotid bruit, no JVD and thyroid  not enlarged, symmetric, no tenderness/mass/nodules Lungs: clear to auscultation bilaterally Heart: regular rate and rhythm Abdomen: soft, non-tender;  bowel sounds normal; no masses,  no organomegaly Extremities: extremities normal, atraumatic, no cyanosis or edema Pulses: 2+ and symmetric Skin: Skin color, texture, turgor normal. No rashes or lesions Neurologic: Grossly normal Psych: Pleasant  EKG: EKG Interpretation Date/Time:  Wednesday Jan 21 2024 08:20:07 EDT Ventricular Rate:  64 PR Interval:  202 QRS Duration:  96 QT Interval:  436 QTC Calculation: 449 R Axis:   23  Text Interpretation: Normal sinus rhythm Normal ECG When compared with ECG of 16-Aug-2018 00:26, PREVIOUS ECG IS PRESENT Confirmed by Dinah Franco [phone]) on 01/21/2024 8:26:25 AM    ASSESSMENT: Dyslipidemia, statin and ezetimibe  intolerant - myalgias Hypertension - uncontrolled Palpitations Family history of cardiovascular disease 0 coronary calcium score (11/2017) Aortic atherosclersosis Diet controlled diabetes-A1c 6.5%  PLAN: 1.   [name] continues to have elevated cholesterol above target.  She has a history of aortic atherosclerosis and recent diagnosis of diet-controlled diabetes in which case I would target her LDL to less than 70.  She cannot take statins or ezetimibe .  We discussed alternatives including PCSK9 inhibitors or possibly Nexletol.  She does have a history of gout but is on allopurinol  and has not had any recent flares.  She will have to monitor for that as there is an increased risk on Nexletol.  She should likely have follow-up uric acid testing after starting therapy.  She should monitor home blood pressures and may need adjustments to her medications.  She does have follow-up with her PCP in August.  Recommend repeat lipids including NMR and LP(a) on therapy in 3 to 4 months.  Follow-up annually with us  or sooner as necessary.  Hazle Lites, MD, [facility], FNLA, FACP  [place]  [facility]  Medical Director of the Advanced Lipid [facility] &  Cardiovascular Risk Reduction Clinic Diplomate of the American Board of Clinical  Lipidology Attending Cardiologist  Direct Dial: [phone]  Fax: [phone]  Website:  [email or web address]   Aviva Lemmings Alyn Riedinger 01/21/2024, 8:26 AM

## 2024-01-21 NOTE — Patient Instructions (Addendum)
 Medication Instructions:  START Nexletol 180mg  once daily   The [employer] offers assistance to help pay for medication copays.  They will cover copays for all cholesterol lowering meds, including statins, fibrates, omega-3 fish oils like Vascepa, ezetimibe , Repatha, Praluent, Nexletol, Nexlizet.  The cards are usually good for $2,500 or 12 months, whichever comes first. Our fax # is [phone] (you will need this to apply) Go to healthwellfoundation.org Click on "Apply Now" Answer questions as to whom is applying (patient or representative) Your disease fund will be "hypercholesterolemia - Medicare access" They will ask questions about finances and which medications you are taking for cholesterol When you submit, the approval is usually within minutes.  You will need to print the card information from the site You will need to show this information to your pharmacy, they will bill your Medicare Part D plan first -then bill [employer] --for the copay.   You can also call them at [phone], although the hold times can be quite long.    *If you need a refill on your cardiac medications before your next appointment, please call your pharmacy*  Lab Work: FASTING lab work in 3-4 months Lipid Panel, LPa, Uric Acid  If you have labs (blood work) drawn today and your tests are completely normal, you will receive your results only by: MyChart Message (if you have MyChart) OR A paper copy in the mail If you have any lab test that is abnormal or we need to change your treatment, we will call you to review the results.   Follow-Up: At [facility], you and your health needs are our priority.  As part of our continuing mission to provide you with exceptional heart care, our providers are all part of one team.  This team includes your primary Cardiologist (physician) and Advanced Practice Providers or APPs (Physician Assistants and Nurse Practitioners) who all work together to  provide you with the care you need, when you need it.  Your next appointment:    12 months with Dr. Maximo Spar   We recommend signing up for the patient portal called "MyChart".  Sign up information is provided on this After Visit Summary.  MyChart is used to connect with patients for Virtual Visits (Telemedicine).  Patients are able to view lab/test results, encounter notes, upcoming appointments, etc.  Non-urgent messages can be sent to your provider as well.   To learn more about what you can do with MyChart, go to [email or web address].   Other Instructions

## 2024-01-28 ENCOUNTER — Other Ambulatory Visit: Payer: Self-pay | Admitting: Internal Medicine

## 2024-02-21 DIAGNOSIS — R070 Pain in throat: Secondary | ICD-10-CM | POA: Diagnosis not present

## 2024-02-21 DIAGNOSIS — Z20822 Contact with and (suspected) exposure to covid-19: Secondary | ICD-10-CM | POA: Diagnosis not present

## 2024-03-29 ENCOUNTER — Other Ambulatory Visit: Payer: Self-pay | Admitting: Family Medicine

## 2024-03-29 DIAGNOSIS — B372 Candidiasis of skin and nail: Secondary | ICD-10-CM

## 2024-03-30 DIAGNOSIS — Z961 Presence of intraocular lens: Secondary | ICD-10-CM | POA: Diagnosis not present

## 2024-03-30 DIAGNOSIS — H40013 Open angle with borderline findings, low risk, bilateral: Secondary | ICD-10-CM | POA: Diagnosis not present

## 2024-03-30 DIAGNOSIS — H04123 Dry eye syndrome of bilateral lacrimal glands: Secondary | ICD-10-CM | POA: Diagnosis not present

## 2024-03-30 DIAGNOSIS — D3131 Benign neoplasm of right choroid: Secondary | ICD-10-CM | POA: Diagnosis not present

## 2024-03-30 DIAGNOSIS — H0102B Squamous blepharitis left eye, upper and lower eyelids: Secondary | ICD-10-CM | POA: Diagnosis not present

## 2024-03-30 DIAGNOSIS — E119 Type 2 diabetes mellitus without complications: Secondary | ICD-10-CM | POA: Diagnosis not present

## 2024-03-30 DIAGNOSIS — H0102A Squamous blepharitis right eye, upper and lower eyelids: Secondary | ICD-10-CM | POA: Diagnosis not present

## 2024-03-30 DIAGNOSIS — H35371 Puckering of macula, right eye: Secondary | ICD-10-CM | POA: Diagnosis not present

## 2024-03-30 LAB — HM DIABETES EYE EXAM

## 2024-04-13 ENCOUNTER — Ambulatory Visit: Payer: Self-pay

## 2024-04-13 NOTE — Telephone Encounter (Signed)
 FYI Only or Action Required?: FYI only for provider.  Patient was last seen in primary care on 10/08/2023 by Joesph Annabella [name], FNP.  Called Nurse Triage reporting [facility].  Symptoms began several days ago.  Symptoms are: gradually worsening.  Triage Disposition: See Physician Within 24 Hours  Patient/caregiver understands and will follow disposition?: Yes  Earliest appointment scheduled for 7/31   Copied from CRM #[id number]. Topic: Clinical - Red Word Triage >> Apr 13, 2024  5:03 PM Harlene ORN wrote: Red Word that prompted transfer to Nurse Triage: Exposed to poison oak. Broke out last week and is spreading. Please advise.     Reason for Disposition  MODERATE to SEVERE itching (e.g., interferes with work, school, sleep, or other activities)  Answer Assessment - Initial Assessment Questions 1. APPEARANCE of RASH: What does the rash look like?      Grouping of little tiny bumps that's spreading  2. LOCATION: Where is the rash located?  (e.g., face, genitals, hands, legs)     Arms, thighs, torso  3. SIZE: How large is the rash?      About 2 inches by 5 inches  4. ONSET: When did the rash begin?      Last week 5. ITCHING: Does the rash itch? If Yes, ask: How bad is it?     Yes, moderate  6. EXPOSURE:  How were you exposed to the plant (poison ivy, poison oak, sumac)  When were you exposed?  Note: Sometimes a poison ivy/oak/sumac rash does not appear for 2 to 3 weeks after exposure.      Unsure  7. PAST HISTORY: Have you had a poison ivy rash before? If Yes, ask: How bad was it?     Yes, has had same in the past and got a shot to help with symptoms  8. OTHER SYMPTOMS: Do you have any other symptoms? (e.g., fever)      No  Protocols used: Poison Ivy - Oak - Sumac-A-AH

## 2024-04-13 NOTE — Telephone Encounter (Signed)
 Noted

## 2024-04-15 ENCOUNTER — Ambulatory Visit (INDEPENDENT_AMBULATORY_CARE_PROVIDER_SITE_OTHER): Admitting: Nurse Practitioner

## 2024-04-15 ENCOUNTER — Encounter: Payer: Self-pay | Admitting: Nurse Practitioner

## 2024-04-15 VITALS — BP 135/75 | HR 64 | Temp 97.8°F | Ht 65.5 in | Wt 178.0 lb

## 2024-04-15 DIAGNOSIS — L237 Allergic contact dermatitis due to plants, except food: Secondary | ICD-10-CM | POA: Diagnosis not present

## 2024-04-15 MED ORDER — METHYLPREDNISOLONE ACETATE 40 MG/ML IJ SUSP
40.0000 mg | Freq: Once | INTRAMUSCULAR | Status: AC
  Administered 2024-04-15: 40 mg via INTRAMUSCULAR

## 2024-04-15 MED ORDER — TRIAMCINOLONE ACETONIDE 0.1 % EX OINT: 1.0000 | TOPICAL_OINTMENT | Freq: Two times a day (BID) | CUTANEOUS | 0 refills | Status: AC

## 2024-04-15 MED ORDER — METHYLPREDNISOLONE SODIUM SUCC 40 MG IJ SOLR: 40.0000 mg | Freq: Once | INTRAMUSCULAR | Status: DC

## 2024-04-15 NOTE — Progress Notes (Signed)
 Acute Office Visit  Subjective:     Patient ID: [name], female    DOB: January 11, 1951, 73 y.o.   MRN: [id number]  Chief Complaint  Patient presents with   Rash    Possible poison oak    [name] is a 73 year old female who presents on 04/15/2024 for an acute visit due to concerns of a rash following poison ivy exposure. The rash began approximately 5 days ago and initially appeared on the left arm, now predominantly affecting the right arm. The lesions are pink in color and pruritic. The rash has remained stable in appearance since onset. Associated symptoms include irritability and insomnia. She denies abdominal pain, headache, myalgia, nausea, and sore throat.  The patient has not sought prior evaluation or treatment for the rash except for taking over-the-counter diphenhydramine (Benadryl) without significant relief. She denies any contact with individuals with similar symptoms and reports no new exposures to soaps, lotions, laundry detergents, foods, medications, plants (aside from poison ivy), insects, or animals.   Reports that has an upcoming appointment with hand specialized  Active Ambulatory Problems    Diagnosis Date Noted   Hypertension associated with diabetes (HCC) 12/09/2014   Hyperlipidemia associated with type 2 diabetes mellitus (HCC) 12/09/2014   Unspecified convulsions (HCC) 12/14/2014   Family history of heart disease 11/21/2017   Other fatigue 11/21/2017   Asthma    Gastroesophageal reflux disease without esophagitis 02/13/2016   New onset type 2 diabetes mellitus (HCC) 04/16/2023   Fatty liver 04/16/2023   Cervical radiculopathy 09/18/2018   Anxiety, generalized 05/14/2016   DDD (degenerative disc disease), cervical 09/18/2018   Obesity (BMI 30-39.9) 02/13/2016   Overactive bladder 04/23/2018   Vitamin D deficiency 02/13/2016   Abnormal cervical Papanicolaou smear 04/16/2023   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  HENT:  Negative for congestion and sore throat.   Respiratory:  Negative for cough, shortness of breath and wheezing.   Cardiovascular:  Negative for chest pain and leg swelling.  Gastrointestinal:  Negative for constipation, nausea and vomiting.  Skin:  Positive for itching and rash.  Neurological:  Negative for dizziness and headaches.   Negative unless indicated in HPI    Objective:    BP 135/75   Pulse 64   Temp 97.8 F (36.6 C)   Ht 5' 5.5 (1.664 m)   Wt 178 lb (80.7 kg)   SpO2 98%   BMI 29.17 kg/m  BP Readings from Last 3 Encounters:  04/15/24 135/75  01/21/24 (!) 142/76  11/18/23 113/75   Wt Readings from Last 3 Encounters:  04/15/24 178 lb (80.7 kg)  01/21/24 180 lb 3.2 oz (81.7 kg)  12/29/23 179 lb 12.8 oz (81.6 kg)      Physical Exam Vitals and nursing note reviewed.  Constitutional:      Appearance: She is not toxic-appearing.  HENT:     Head: Normocephalic and  atraumatic.     Nose: Nose normal.     Mouth/Throat:     Mouth: Mucous membranes are moist.  Eyes:     General: No scleral icterus.    Extraocular Movements: Extraocular movements intact.     Conjunctiva/sclera: Conjunctivae normal.     Pupils: Pupils are equal, round, and reactive to light.  Cardiovascular:     Heart sounds: Normal heart sounds.  Pulmonary:     Effort: Pulmonary effort is normal.     Breath sounds: Normal breath sounds.  Musculoskeletal:        General: Normal range of  motion.     Right lower leg: No edema.     Left lower leg: No edema.  Skin:    General: Skin is warm and dry.     Findings: Rash present. Rash is purpuric.  Neurological:     Mental Status: She is alert and oriented to person, place, and time.  Psychiatric:        Mood and Affect: Mood normal.        Behavior: Behavior normal.        Thought Content: Thought content normal.        Judgment: Judgment normal.    Pertinent labs & imaging results that were available during my care of the patient were reviewed by me and considered in my medical decision making.  No results found for any visits on 04/15/24.      Assessment & Plan:  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 -     Triamcinolone  Acetonide; Apply 1 Application topically 2 (two) times daily.  Dispense: 30 g; Refill: 0 -     methylPREDNISolone  Acetate    [name] is a 73 yrs old female seen today fro  IM injection of [place] with insulin  administered at the office, triamcinolone  cream twice daily; continue Zyrtec daily May use over-the-counter calamine lotion, The above assessment and management plan was discussed with the patient. The patient verbalized understanding of and has agreed to the management plan. Patient is aware to call the clinic if they develop any new symptoms or if symptoms persist or worsen. Patient is aware when to return to the clinic for a follow-up visit. Patient educated on when it is appropriate to go to the emergency department.  Return if symptoms worsen or fail to improve.  Tanya Garant [place], DNP [facility] [address] [place], [place] [address] [phone]  Note: This document was prepared by [employer] voice dictation technology and any errors that results from this process are unintentional.

## 2024-04-22 ENCOUNTER — Other Ambulatory Visit: Payer: Self-pay | Admitting: Family Medicine

## 2024-04-22 DIAGNOSIS — Z8739 Personal history of other diseases of the musculoskeletal system and connective tissue: Secondary | ICD-10-CM

## 2024-05-03 ENCOUNTER — Ambulatory Visit (INDEPENDENT_AMBULATORY_CARE_PROVIDER_SITE_OTHER): Payer: Medicare Other | Admitting: Family Medicine

## 2024-05-03 ENCOUNTER — Encounter: Payer: Self-pay | Admitting: Family Medicine

## 2024-05-03 VITALS — BP 128/81 | HR 67 | Temp 98.0°F | Ht 65.5 in | Wt 175.0 lb

## 2024-05-03 DIAGNOSIS — N3000 Acute cystitis without hematuria: Secondary | ICD-10-CM | POA: Diagnosis not present

## 2024-05-03 DIAGNOSIS — K219 Gastro-esophageal reflux disease without esophagitis: Secondary | ICD-10-CM

## 2024-05-03 DIAGNOSIS — E1159 Type 2 diabetes mellitus with other circulatory complications: Secondary | ICD-10-CM | POA: Diagnosis not present

## 2024-05-03 DIAGNOSIS — M609 Myositis, unspecified: Secondary | ICD-10-CM | POA: Diagnosis not present

## 2024-05-03 DIAGNOSIS — Z0001 Encounter for general adult medical examination with abnormal findings: Secondary | ICD-10-CM

## 2024-05-03 DIAGNOSIS — B359 Dermatophytosis, unspecified: Secondary | ICD-10-CM

## 2024-05-03 DIAGNOSIS — Z0000 Encounter for general adult medical examination without abnormal findings: Secondary | ICD-10-CM

## 2024-05-03 DIAGNOSIS — E559 Vitamin D deficiency, unspecified: Secondary | ICD-10-CM | POA: Diagnosis not present

## 2024-05-03 DIAGNOSIS — I152 Hypertension secondary to endocrine disorders: Secondary | ICD-10-CM

## 2024-05-03 DIAGNOSIS — E1169 Type 2 diabetes mellitus with other specified complication: Secondary | ICD-10-CM | POA: Diagnosis not present

## 2024-05-03 DIAGNOSIS — T466X5D Adverse effect of antihyperlipidemic and antiarteriosclerotic drugs, subsequent encounter: Secondary | ICD-10-CM

## 2024-05-03 DIAGNOSIS — K760 Fatty (change of) liver, not elsewhere classified: Secondary | ICD-10-CM

## 2024-05-03 DIAGNOSIS — E785 Hyperlipidemia, unspecified: Secondary | ICD-10-CM

## 2024-05-03 DIAGNOSIS — R300 Dysuria: Secondary | ICD-10-CM | POA: Diagnosis not present

## 2024-05-03 LAB — MICROSCOPIC EXAMINATION
Renal Epithel, UA: NONE SEEN /HPF
WBC, UA: >30 /HPF — AB (ref 0–5)
Yeast, UA: NONE SEEN

## 2024-05-03 LAB — URINALYSIS, ROUTINE W REFLEX MICROSCOPIC
Bilirubin, UA: NEGATIVE
Glucose, UA: NEGATIVE
Ketones, UA: NEGATIVE
Nitrite, UA: POSITIVE — AB
Specific Gravity, UA: 1.025 (ref 1.005–1.030)
Urobilinogen, Ur: 0.2 mg/dL (ref 0.2–1.0)
pH, UA: 5.5 (ref 5.0–7.5)

## 2024-05-03 LAB — BAYER DCA HB A1C WAIVED: HB A1C (BAYER DCA - WAIVED): 5.6 % (ref 4.8–5.6)

## 2024-05-03 MED ORDER — BLOOD GLUCOSE MONITORING SUPPL DEVI: 0 refills | Status: AC

## 2024-05-03 MED ORDER — KETOCONAZOLE 2 % EX CREA: 1.0000 | TOPICAL_CREAM | Freq: Every day | CUTANEOUS | 0 refills | Status: AC

## 2024-05-03 MED ORDER — CEPHALEXIN 500 MG PO CAPS: 500.0000 mg | ORAL_CAPSULE | Freq: Three times a day (TID) | ORAL | 0 refills | Status: AC

## 2024-05-03 MED ORDER — BLOOD GLUCOSE TEST VI STRP: ORAL_STRIP | 3 refills | Status: AC

## 2024-05-03 MED ORDER — LANCET DEVICE MISC: 0 refills | Status: AC

## 2024-05-03 MED ORDER — LANCETS MISC. MISC: 3 refills | Status: AC

## 2024-05-03 MED ORDER — FLUCONAZOLE 150 MG PO TABS: 150.0000 mg | ORAL_TABLET | Freq: Once | ORAL | 0 refills | Status: AC

## 2024-05-03 NOTE — Progress Notes (Signed)
 [name] is a 73 y.o. female presents to office today for annual physical exam examination.    Concerns today include: 1. Type 2 Diabetes with hypertension, hyperlipidemia/ dysuria:  DM is diet controlled.  H/o statin induced myopathy.  Sees cardiology.  On Nexletol .  Renal function impaired last lab draw.  Low at home: 52s, Taking medication(s): Is diet controlled.  She is taking Nexletol  newly for cholesterol.  She does feel like it may be causing some increased urination.  Recently, she reports some dysuria but no hematuria.  No flank pain, nausea or vomiting.  No chest pain, shortness of breath or change in exercise tolerance.  No foot ulcerations.  Last eye exam: UTD Last foot exam: needs Last A1c:  Lab Results  Component Value Date   HGBA1C 6.3 (H) 09/30/2023   Nephropathy screen indicated?: needs Last flu, zoster and/or pneumovax:  Immunization History  Administered Date(s) Administered   Fluad Quad(high Dose 65+) 08/22/2017, 05/14/2019, 08/14/2020, 07/31/2021, 08/05/2022   Fluad Trivalent(High Dose 65+) 08/04/2023   Influenza, High Dose Seasonal PF 06/12/2016, 08/22/2017, 06/20/2018   Influenza, Quadrivalent, Recombinant, Inj, Pf 05/29/2019   Influenza,trivalent, recombinat, inj, PF 09/12/2014   Influenza-Unspecified 09/12/2014, 06/12/2016, 08/22/2017, 06/20/2018, 05/29/2019   Moderna Sars-Covid-2 Vaccination 11/11/2019, 12/10/2019   Pneumococcal Conjugate-13 08/15/2016   Pneumococcal Polysaccharide-23 07/13/2019   Tdap 02/08/2020   Zoster Recombinant(Shingrix ) 10/14/2019, 01/14/2020   Zoster, Live 12/04/2012    2.  Rash She reports that she had a rash on her upper extremity and in between her thighs that was felt to be a plant contact dermatitis but she notes that this never looked vesicular or like the typical poison oak that she is experienced in the past.  She does report that it was diffusely itchy.  She has some postinflammatory hyperpigmentation of the skin  on her legs and she still has an itchy patch on the right upper extremity just underneath the axilla.  She was treated with topical triamcinolone  and given a steroid shot but she notes that really did not resolve the issues.  She was seen back in July for this.  Occupation: retired, Marital status: married, Substance use: none Health Maintenance Due  Topic Date Due   COVID-19 Vaccine (3 - 2024-25 season) 05/18/2023   MAMMOGRAM  06/06/2023   HEMOGLOBIN A1C  03/29/2024   INFLUENZA VACCINE  04/16/2024   Refills needed today: none  Immunization History  Administered Date(s) Administered   Fluad Quad(high Dose 65+) 08/22/2017, 05/14/2019, 08/14/2020, 07/31/2021, 08/05/2022   Fluad Trivalent(High Dose 65+) 08/04/2023   Influenza, High Dose Seasonal PF 06/12/2016, 08/22/2017, 06/20/2018   Influenza, Quadrivalent, Recombinant, Inj, Pf 05/29/2019   Influenza,trivalent, recombinat, inj, PF 09/12/2014   Influenza-Unspecified 09/12/2014, 06/12/2016, 08/22/2017, 06/20/2018, 05/29/2019   Moderna Sars-Covid-2 Vaccination 11/11/2019, 12/10/2019   Pneumococcal Conjugate-13 08/15/2016   Pneumococcal Polysaccharide-23 07/13/2019   Tdap 02/08/2020   Zoster Recombinant(Shingrix ) 10/14/2019, 01/14/2020   Zoster, Live 12/04/2012   Past Medical History:  Diagnosis Date   Allergy    Asthma    Back pain    Depression    GERD (gastroesophageal reflux disease)    Hyperlipidemia    Seizures (HCC) 12/14/2014   Tendinitis of left rotator cuff 07/20/2020   Transient memory loss 12/09/2014   Social History   Socioeconomic History   Marital status: Married    Spouse name: Not on file   Number of children: Not on file   Years of education: Not on file   Highest education level: Not on  file  Occupational History   Not on file  Tobacco Use   Smoking status: Never   Smokeless tobacco: Never  Vaping Use   Vaping status: Never Used  Substance and Sexual Activity   Alcohol use: No   Drug use: No    Sexual activity: Not on file  Other Topics Concern   Not on file  Social History Narrative   Not on file   Social Drivers of Health   Financial Resource Strain: Low Risk  (08/04/2023)   Overall Financial Resource Strain (CARDIA)    Difficulty of Paying Living Expenses: Not hard at all  Food Insecurity: No Food Insecurity (08/04/2023)   Hunger Vital Sign    Worried About Running Out of Food in the Last Year: Never true    Ran Out of Food in the Last Year: Never true  Transportation Needs: No Transportation Needs (08/04/2023)   PRAPARE - Administrator, Civil Service (Medical): No    Lack of Transportation (Non-Medical): No  Physical Activity: Insufficiently Active (08/04/2023)   Exercise Vital Sign    Days of Exercise per Week: 3 days    Minutes of Exercise per Session: 30 min  Stress: No Stress Concern Present (08/04/2023)   [employer] of Occupational Health - Occupational Stress Questionnaire    Feeling of Stress : Not at all  Social Connections: Socially Integrated (08/04/2023)   Social Connection and Isolation Panel    Frequency of Communication with Friends and Family: More than three times a week    Frequency of Social Gatherings with Friends and Family: Three times a week    Attends Religious Services: More than 4 times per year    Active Member of Clubs or Organizations: Yes    Attends Banker Meetings: More than 4 times per year    Marital Status: Married  Catering manager Violence: Not At Risk (08/04/2023)   Humiliation, Afraid, Rape, and Kick questionnaire    Fear of Current or Ex-Partner: No    Emotionally Abused: No    Physically Abused: No    Sexually Abused: No   Past Surgical History:  Procedure Laterality Date   bladder mesh     COLONOSCOPY N/A 07/12/2015   Procedure: COLONOSCOPY;  Surgeon: Claudis RAYMOND Rivet, MD;  [facility]: [facility];  Service: Endoscopy;  Laterality: N/A;  1030   COLONOSCOPY WITH PROPOFOL  N/A 11/18/2023    Procedure: COLONOSCOPY WITH PROPOFOL ;  Surgeon: Eartha Angelia Sieving, MD;  Location: [facility];  Service: Gastroenterology;  Laterality: N/A;  8:15AM;ASA 1   NM MYOCAR PERF WALL MOTION  02/15/2007   dipyridamole myoview; EF 70%, no wall motion abnormalities, no inducible ischemia, low risk    OVARIAN CYST REMOVAL Left 09/16/1985   POLYPECTOMY  11/18/2023   Procedure: POLYPECTOMY, INTESTINE;  Surgeon: Eartha Angelia, Sieving, MD;  Location: [facility];  Service: Gastroenterology;;   TONSILECTOMY/ADENOIDECTOMY WITH MYRINGOTOMY  09/16/1956   TONSILLECTOMY     TRANSTHORACIC ECHOCARDIOGRAM  02/15/2007   RV mildly dlated; LV normal in size; mild MR with thickened MV leaflets; mild TR; mild pulm valve regurg   VAGINAL DELIVERY     x3   Family History  Problem Relation Age of Onset   Heart disease Mother    Kidney disease Mother    Breast cancer Mother    Coronary artery disease Mother        CABG at age 43   Dementia Father    Stroke Father  Stroke Maternal Grandmother    Cancer Maternal Grandfather    Cancer Paternal Grandmother    Kidney disease Paternal Grandfather     Current Outpatient Medications:    albuterol  (VENTOLIN  HFA) 108 (90 Base) MCG/ACT inhaler, Inhale 2 puffs into the lungs every 6 (six) hours as needed for wheezing or shortness of breath., Disp: 1 each, Rfl: 0   allopurinol  (ZYLOPRIM ) 300 MG tablet, TAKE 1 TABLET BY MOUTH IN THE  EVENING, Disp: 100 tablet, Rfl: 0   Ascorbic Acid (VITAMIN C PO), Take by mouth., Disp: , Rfl:    atenolol  (TENORMIN ) 50 MG tablet, TAKE 1 TABLET BY MOUTH DAILY, Disp: 90 tablet, Rfl: 3   azelastine  (ASTELIN ) 0.1 % nasal spray, Place 1 spray into both nostrils 2 (two) times daily., Disp: 30 mL, Rfl: 12   Bempedoic Acid  (NEXLETOL ) 180 MG TABS, Take 1 tablet (180 mg total) by mouth daily., Disp: 30 tablet, Rfl: 11   BIOTIN PO, Take by mouth., Disp: , Rfl:    Blood Glucose Monitoring Suppl (ONETOUCH VERIO FLEX SYSTEM) w/Device KIT,  Use to test blood sugar daily as directed DX: E11.65, Disp: 1 kit, Rfl: 0   budesonide (PULMICORT) 180 MCG/ACT inhaler, Inhale 2 puffs into the lungs 2 (two) times daily., Disp: , Rfl:    cetirizine (ZYRTEC) 10 MG tablet, Take 1 tablet by mouth daily., Disp: , Rfl:    cholecalciferol (VITAMIN D3) 25 MCG (1000 UT) tablet, Take 1,000 Units by mouth daily., Disp: , Rfl:    EPINEPHrine  (EPIPEN  2-PAK) 0.3 mg/0.3 mL IJ SOAJ injection, Inject 0.3 mg into the muscle as needed for anaphylaxis (then go to ER)., Disp: 1 each, Rfl: 0   esomeprazole  (NEXIUM ) 20 MG capsule, Take 1 capsule (20 mg total) by mouth daily at 12 noon. For GERD, Disp: 100 capsule, Rfl: 3   glucose blood (ONETOUCH VERIO) test strip, Use to test blood sugar daily as directed DX: E11.65, Disp: 200 each, Rfl: 5   Lancet Device MISC, Check BGs every morning. E11.9.May substitute to any manufacturer covered by patient's insurance., Disp: 1 each, Rfl: 0   Lancets Misc. MISC, Check BGs every morning. E11.9.May substitute to any manufacturer covered by patient's insurance., Disp: 100 each, Rfl: 3   levocetirizine (XYZAL ) 5 MG tablet, Take 1 tablet (5 mg total) by mouth every evening., Disp: 90 tablet, Rfl: 3   montelukast  (SINGULAIR ) 10 MG tablet, TAKE 1 TABLET BY MOUTH IN THE  MORNING, Disp: 100 tablet, Rfl: 2   Multiple Vitamin (MULTIVITAMIN) capsule, Take 1 capsule by mouth daily. , Disp: , Rfl:    OneTouch Delica Lancets 33G MISC, Use to test blood sugar daily as directed DX: E11.65, Disp: 200 each, Rfl: 5   sertraline  (ZOLOFT ) 25 MG tablet, Take 25 mg by mouth daily., Disp: , Rfl:    triamcinolone  ointment (KENALOG ) 0.1 %, Apply 1 Application topically 2 (two) times daily., Disp: 30 g, Rfl: 0  Allergies  Allergen Reactions   Bee Venom Swelling   Crestor [Rosuvastatin] Other (See Comments)    Myalgias   Erythromycin Itching, Rash and Other (See Comments)   Rosuvastatin Calcium Other (See Comments)    Myalgias   Peanut-Containing Drug  Products     Neck breaks out and gets really red   Pravastatin  Other (See Comments)    Pain in joints.   Zetia  [Ezetimibe ]    Aleve [Naproxen Sodium]     Gastritis   Tetracyclines & Related Other (See Comments)    unknown  Vytorin [Ezetimibe -Simvastatin] Other (See Comments)    unknown     ROS: Review of Systems A comprehensive review of systems was negative except for: Gastrointestinal: positive for diarrhea Genitourinary: positive for dysuria and frequency Integument/breast: positive for rash and bruising on her legs after [place] apartment to [place] townhome    Physical exam BP 128/81   Pulse 67   Temp 98 F (36.7 C)   Ht 5' 5.5 (1.664 m)   Wt 175 lb (79.4 kg)   SpO2 99%   BMI 28.68 kg/m  General appearance: alert, cooperative, appears stated age, and no distress Head: Normocephalic, without obvious abnormality, atraumatic Eyes: negative findings: lids and lashes normal, conjunctivae and sclerae normal, corneas clear, and pupils equal, round, reactive to light and accomodation Ears: normal TM's and external ear canals both ears Nose: Nares normal. Septum midline. Mucosa normal. No drainage or sinus tenderness. Throat: lips, mucosa, and tongue normal; teeth and gums normal Neck: no adenopathy, no carotid bruit, supple, symmetrical, trachea midline, and thyroid  not enlarged, symmetric, no tenderness/mass/nodules Back: symmetric, no curvature. ROM normal. No CVA tenderness. Lungs: clear to auscultation bilaterally Heart: regular rate and rhythm, S1, S2 normal, no murmur, click, rub or gallop Abdomen: Mild epigastric tenderness to palpation but no guarding, rebound.  No palpable masses.  No hepatosplenomegaly GU: +suprapubic TTP. No CVA TTP Extremities: extremities normal, atraumatic, no cyanosis or edema Pulses: 2+ and symmetric Skin: Some areas of ecchymosis along the right lower extremity.  She has what appears to be a slightly hyperpigmented patch of rash  along the right inner upper arm that has irregular borders and some scaly changes.  No vesicles appreciated. Lymph nodes: Cervical, supraclavicular, and axillary nodes normal. Neurologic: Grossly normal      05/03/2024    9:12 AM 04/15/2024    8:06 AM 10/08/2023    8:11 AM  Depression screen PHQ 2/9  Decreased Interest 0 0 0  Down, Depressed, Hopeless 0 0 0  PHQ - 2 Score 0 0 0  Altered sleeping  1 0  Tired, decreased energy  0 0  Change in appetite  0 0  Feeling bad or failure about yourself   0 0  Trouble concentrating  0 0  Moving slowly or fidgety/restless  0 0  Suicidal thoughts  0 0  PHQ-9 Score  1 0  Difficult doing work/chores Not difficult at all Not difficult at all Not difficult at all      05/03/2024    9:12 AM 04/15/2024    8:06 AM 10/08/2023    8:11 AM 09/30/2023   11:04 AM  GAD 7 : Generalized Anxiety Score  Nervous, Anxious, on Edge 0 0 0 0  Control/stop worrying 0 0 0 0  Worry too much - different things 0 0 0 0  Trouble relaxing 0 0 0 0  Restless 0 0 0 0  Easily annoyed or irritable 0 0 0 0  Afraid - awful might happen 0 0 0 0  Total GAD 7 Score 0 0 0 0  Anxiety Difficulty Not difficult at all  Not difficult at all Not difficult at all     Assessment/ Plan: [name] here for annual physical exam.   Annual physical exam  Acute cystitis without hematuria - Plan: Urinalysis, Routine w reflex microscopic, Urine Culture, cephALEXin  (KEFLEX ) 500 MG capsule, fluconazole  (DIFLUCAN ) 150 MG tablet  Tinea - Plan: ketoconazole  (NIZORAL ) 2 % cream  Type 2 diabetes mellitus with other specified complication,  without long-term current use of insulin  (HCC) - Plan: Microalbumin / creatinine urine ratio, Bayer DCA Hb A1c Waived, CBC, Blood Glucose Monitoring Suppl DEVI, Glucose Blood (BLOOD GLUCOSE TEST STRIPS) STRP, Lancet Device MISC, Lancets Misc. MISC  Hyperlipidemia associated with type 2 diabetes mellitus (HCC)  Statin-induced myositis  Fatty liver -  Plan: CBC  Hypertension associated with diabetes (HCC) - Plan: Renal Function Panel  Gastroesophageal reflux disease without esophagitis - Plan: CBC  Vitamin D  deficiency - Plan: VITAMIN D  25 Hydroxy (Vit-D Deficiency, Fractures)  Urinalysis demonstrated evidence of acute cystitis which I suspect is going to be E. coli given nitrate positive.  I placed her on Keflex  3 times daily for 5 days.  Diflucan  pill given as needed.  Push oral fluids.  Follow-up if symptoms or not improving.  Culture pending  Rash seems more consistent with a tinea.  Ketoconazole  cream applied to the affected area daily for 2 weeks.  She will contact me if this does not resolve  Urine microalbumin collected.  A1c demonstrated diet-controlled diabetes today.  Testing supplies sent  She is currently treated with Nexletol .  May need to consider switching over to Repatha due to reported side effects.  Keep appointment with cardiology next month.  Has future orders for lipid panel which was not collected today  Will recheck a renal function panel today, check CBC given PPI use.  Check vitamin D  level given history of deficiency  Counseled on healthy lifestyle choices, including diet (Krotzer in fruits, vegetables and lean meats and low in salt and simple carbohydrates) and exercise (at least 30 minutes of moderate physical activity daily).  Patient to follow up 15m for DM  Amandeep Hogston M. Jolinda, DO

## 2024-05-03 NOTE — Patient Instructions (Signed)

## 2024-05-04 ENCOUNTER — Ambulatory Visit: Payer: Self-pay | Admitting: Family Medicine

## 2024-05-04 DIAGNOSIS — R809 Proteinuria, unspecified: Secondary | ICD-10-CM

## 2024-05-04 LAB — MICROALBUMIN / CREATININE URINE RATIO
Creatinine, Urine: 174 mg/dL
Microalb/Creat Ratio: 85 mg/g{creat} — ABNORMAL HIGH (ref 0–29)
Microalbumin, Urine: 148.3 ug/mL

## 2024-05-04 LAB — CBC
Hematocrit: 43.2 % (ref 34.0–46.6)
Hemoglobin: 14.6 g/dL (ref 11.1–15.9)
MCH: 30.4 pg (ref 26.6–33.0)
MCHC: 33.8 g/dL (ref 31.5–35.7)
MCV: 90 fL (ref 79–97)
Platelets: 201 x10E3/uL (ref 150–450)
RBC: 4.8 x10E6/uL (ref 3.77–5.28)
RDW: 13.4 % (ref 11.7–15.4)
WBC: 8.5 x10E3/uL (ref 3.4–10.8)

## 2024-05-04 LAB — VITAMIN D 25 HYDROXY (VIT D DEFICIENCY, FRACTURES): Vit D, 25-Hydroxy: 38.6 ng/mL (ref 30.0–100.0)

## 2024-05-04 LAB — RENAL FUNCTION PANEL
Albumin: 5 g/dL — ABNORMAL HIGH (ref 3.8–4.8)
BUN/Creatinine Ratio: 20 (ref 12–28)
BUN: 20 mg/dL (ref 8–27)
CO2: 22 mmol/L (ref 20–29)
Calcium: 9.6 mg/dL (ref 8.7–10.3)
Chloride: 101 mmol/L (ref 96–106)
Creatinine, Ser: 1.02 mg/dL — ABNORMAL HIGH (ref 0.57–1.00)
Glucose: 115 mg/dL — ABNORMAL HIGH (ref 70–99)
Phosphorus: 3.9 mg/dL (ref 3.0–4.3)
Potassium: 4.6 mmol/L (ref 3.5–5.2)
Sodium: 141 mmol/L (ref 134–144)
eGFR: 58 mL/min/1.73 — ABNORMAL LOW (ref >59)

## 2024-05-05 DIAGNOSIS — M19042 Primary osteoarthritis, left hand: Secondary | ICD-10-CM | POA: Diagnosis not present

## 2024-05-05 DIAGNOSIS — M19041 Primary osteoarthritis, right hand: Secondary | ICD-10-CM | POA: Diagnosis not present

## 2024-05-05 DIAGNOSIS — M180 Bilateral primary osteoarthritis of first carpometacarpal joints: Secondary | ICD-10-CM | POA: Diagnosis not present

## 2024-05-05 DIAGNOSIS — M79642 Pain in left hand: Secondary | ICD-10-CM | POA: Diagnosis not present

## 2024-05-05 DIAGNOSIS — M720 Palmar fascial fibromatosis [Dupuytren]: Secondary | ICD-10-CM | POA: Diagnosis not present

## 2024-05-05 DIAGNOSIS — M79641 Pain in right hand: Secondary | ICD-10-CM | POA: Diagnosis not present

## 2024-05-05 LAB — URINE CULTURE

## 2024-05-06 NOTE — Telephone Encounter (Signed)
 Reviewed results with patient per PCP notes. Patient voiced understanding.

## 2024-05-20 DIAGNOSIS — S96912A Strain of unspecified muscle and tendon at ankle and foot level, left foot, initial encounter: Secondary | ICD-10-CM | POA: Diagnosis not present

## 2024-05-20 DIAGNOSIS — S93402A Sprain of unspecified ligament of left ankle, initial encounter: Secondary | ICD-10-CM | POA: Diagnosis not present

## 2024-05-27 DIAGNOSIS — S96912A Strain of unspecified muscle and tendon at ankle and foot level, left foot, initial encounter: Secondary | ICD-10-CM | POA: Diagnosis not present

## 2024-05-27 DIAGNOSIS — S93402A Sprain of unspecified ligament of left ankle, initial encounter: Secondary | ICD-10-CM | POA: Diagnosis not present

## 2024-06-15 DIAGNOSIS — S93402A Sprain of unspecified ligament of left ankle, initial encounter: Secondary | ICD-10-CM | POA: Diagnosis not present

## 2024-06-15 DIAGNOSIS — Z1231 Encounter for screening mammogram for malignant neoplasm of breast: Secondary | ICD-10-CM | POA: Diagnosis not present

## 2024-06-16 DIAGNOSIS — E782 Mixed hyperlipidemia: Secondary | ICD-10-CM | POA: Diagnosis not present

## 2024-06-17 LAB — LIPID PANEL
Chol/HDL Ratio: 4.5 ratio — ABNORMAL HIGH (ref 0.0–4.4)
Cholesterol, Total: 172 mg/dL (ref 100–199)
HDL: 38 mg/dL — ABNORMAL LOW (ref >39)
LDL Chol Calc (NIH): 92 mg/dL (ref 0–99)
Triglycerides: 250 mg/dL — ABNORMAL HIGH (ref 0–149)
VLDL Cholesterol Cal: 42 mg/dL — ABNORMAL HIGH (ref 5–40)

## 2024-06-17 LAB — URIC ACID: Uric Acid: 4.1 mg/dL (ref 3.1–7.9)

## 2024-06-17 LAB — LIPOPROTEIN A (LPA): Lipoprotein (a): 234.4 nmol/L — ABNORMAL HIGH (ref <75.0)

## 2024-06-20 ENCOUNTER — Ambulatory Visit: Payer: Self-pay | Admitting: Internal Medicine

## 2024-06-21 ENCOUNTER — Other Ambulatory Visit: Payer: Self-pay | Admitting: Obstetrics and Gynecology

## 2024-06-21 DIAGNOSIS — R928 Other abnormal and inconclusive findings on diagnostic imaging of breast: Secondary | ICD-10-CM

## 2024-06-24 ENCOUNTER — Other Ambulatory Visit

## 2024-06-25 ENCOUNTER — Telehealth: Payer: Self-pay | Admitting: Pharmacy Technician

## 2024-06-25 NOTE — Telephone Encounter (Signed)
 Pharmacy Patient Advocate Encounter  Received notification from [facility] that Prior Authorization for nexletol  has been APPROVED from 06/25/24 to 09/15/25   PA #/Case ID/Reference #: [id number]

## 2024-06-25 NOTE — Telephone Encounter (Signed)
   Pharmacy Patient Advocate Encounter   Received notification from CoverMyMeds that prior authorization for nexletol  is required/requested.   Insurance verification completed.   The patient is insured through [place].   Per test claim: PA required; PA submitted to above mentioned insurance via Latent Key/confirmation #/EOC [id number] Status is pending

## 2024-06-30 ENCOUNTER — Encounter (INDEPENDENT_AMBULATORY_CARE_PROVIDER_SITE_OTHER): Payer: Self-pay | Admitting: Gastroenterology

## 2024-06-30 NOTE — Telephone Encounter (Signed)
 Pt returning call

## 2024-07-01 ENCOUNTER — Telehealth: Payer: Self-pay | Admitting: Pharmacy Technician

## 2024-07-01 ENCOUNTER — Other Ambulatory Visit (HOSPITAL_COMMUNITY): Payer: Self-pay

## 2024-07-01 DIAGNOSIS — E785 Hyperlipidemia, unspecified: Secondary | ICD-10-CM

## 2024-07-01 DIAGNOSIS — E7841 Elevated Lipoprotein(a): Secondary | ICD-10-CM

## 2024-07-01 DIAGNOSIS — D3131 Benign neoplasm of right choroid: Secondary | ICD-10-CM | POA: Diagnosis not present

## 2024-07-01 MED ORDER — REPATHA SURECLICK 140 MG/ML ~~LOC~~ SOAJ: 140.0000 mg | SUBCUTANEOUS | 11 refills | Status: AC

## 2024-07-01 NOTE — Telephone Encounter (Signed)
   Pharmacy Patient Advocate Encounter   Received notification from Pt Calls Messages that prior authorization for repatha is required/requested.   Insurance verification completed.   The patient is insured through [facility].   Per test claim: PA required; PA submitted to above mentioned insurance via Latent Key/confirmation #/EOC [id number] Status is pending

## 2024-07-01 NOTE — Addendum Note (Signed)
 Addended by: LORING ANDRIETTE [name] on: 07/01/2024 04:43 PM   Modules accepted: Orders

## 2024-07-01 NOTE — Telephone Encounter (Signed)
 Spoke with patient. Aware med is approved. Rx(s) sent to pharmacy electronically. Advised will mail lab slips for repeat fasting blood work in about 3-4 months.

## 2024-07-01 NOTE — Telephone Encounter (Signed)
 Pharmacy Patient Advocate Encounter  Received notification from [facility] that Prior Authorization for repatha has been APPROVED from 07/01/24 to 12/30/24   PA #/Case ID/Reference #: [id number]

## 2024-07-02 ENCOUNTER — Telehealth: Payer: Self-pay | Admitting: Internal Medicine

## 2024-07-02 NOTE — Telephone Encounter (Signed)
 Patient reports she sprained her left ankle back on 9/4 and had been favoring her right leg when ambulating/transferring.   For the last several weeks she has had aching leg pain in both legs down the backside from hips to heels. She states they ache even when she is sitting/lying down.  She is not sure if this is from her sprained ankle or if it could be the Bempedoic acid .  She has not started Repatha yet and is waiting until see hears back from our office about what she should do regarding these 2 medications.  Patient would like to know if she can hold the bempedoic acid  for a couple weeks to see if leg pain resolves. Will forward to Dr. Mona and Pharm D to review.

## 2024-07-02 NOTE — Telephone Encounter (Signed)
 Pt c/o medication issue:  1. Name of Medication: Bempedoic Acid  (NEXLETOL ) 180 MG TABS   2. How are you currently taking this medication (dosage and times per day)? As written   3. Are you having a reaction (difficulty breathing--STAT)? no  4. What is your medication issue? Pt states she has been having some leg pain and wanted to know if this medication is causing this side affect. Pt would like a c/b please advise

## 2024-07-05 NOTE — Telephone Encounter (Signed)
 Mona Vinie BROCKS, MD to [facility] Triage      07/02/24  5:53 PM I suspect the pain is from abnormal gait related to ankle sprain. Bempedoic acid  can aggrevate tendonitis. Why doesn't she stop the medicine for a few weeks until the symptoms resolve and then restart when she is feeling better. Hold off on the Repatha as well, until we are sure the bempedoic acid  is tolerated.   Dr. Mona  Left message with the information above over voicemail. Left call back number for any questions/concerns.

## 2024-07-05 NOTE — Telephone Encounter (Signed)
 Left message to call back.

## 2024-07-05 NOTE — Telephone Encounter (Signed)
 Patient returned RN's call regarding medication issue.

## 2024-07-06 ENCOUNTER — Ambulatory Visit
Admission: RE | Admit: 2024-07-06 | Discharge: 2024-07-06 | Disposition: A | Discharge status: Home / Self Care | Source: Ambulatory Visit | Attending: Obstetrics and Gynecology | Admitting: Obstetrics and Gynecology

## 2024-07-06 ENCOUNTER — Other Ambulatory Visit: Payer: Self-pay | Admitting: Obstetrics and Gynecology

## 2024-07-06 DIAGNOSIS — R928 Other abnormal and inconclusive findings on diagnostic imaging of breast: Secondary | ICD-10-CM

## 2024-07-06 DIAGNOSIS — N6312 Unspecified lump in the right breast, upper inner quadrant: Secondary | ICD-10-CM | POA: Diagnosis not present

## 2024-07-07 ENCOUNTER — Telehealth: Payer: Self-pay

## 2024-07-07 DIAGNOSIS — K760 Fatty (change of) liver, not elsewhere classified: Secondary | ICD-10-CM

## 2024-07-07 DIAGNOSIS — E1159 Type 2 diabetes mellitus with other circulatory complications: Secondary | ICD-10-CM

## 2024-07-07 DIAGNOSIS — E1169 Type 2 diabetes mellitus with other specified complication: Secondary | ICD-10-CM

## 2024-07-07 NOTE — Telephone Encounter (Signed)
 Copied from CRM [id number]. Topic: Referral - Status >> Jul 07, 2024 10:25 AM Joesph NOVAK wrote: Reason for CRM: [facility] Needs a updated referral for patient. PH: [phone]

## 2024-07-07 NOTE — Telephone Encounter (Signed)
 done

## 2024-07-08 ENCOUNTER — Ambulatory Visit
Admission: RE | Admit: 2024-07-08 | Discharge: 2024-07-08 | Disposition: A | Discharge status: Home / Self Care | Source: Ambulatory Visit | Attending: Obstetrics and Gynecology | Admitting: Obstetrics and Gynecology

## 2024-07-08 ENCOUNTER — Encounter: Attending: Family Medicine | Admitting: Nutrition

## 2024-07-08 ENCOUNTER — Ambulatory Visit
Admission: RE | Admit: 2024-07-08 | Discharge: 2024-07-08 | Disposition: A | Source: Ambulatory Visit | Attending: Obstetrics and Gynecology | Admitting: Obstetrics and Gynecology

## 2024-07-08 DIAGNOSIS — I152 Hypertension secondary to endocrine disorders: Secondary | ICD-10-CM | POA: Diagnosis present

## 2024-07-08 DIAGNOSIS — N6312 Unspecified lump in the right breast, upper inner quadrant: Secondary | ICD-10-CM

## 2024-07-08 DIAGNOSIS — E782 Mixed hyperlipidemia: Secondary | ICD-10-CM | POA: Insufficient documentation

## 2024-07-08 DIAGNOSIS — C50211 Malignant neoplasm of upper-inner quadrant of right female breast: Secondary | ICD-10-CM | POA: Diagnosis not present

## 2024-07-08 DIAGNOSIS — Z171 Estrogen receptor negative status [ER-]: Secondary | ICD-10-CM | POA: Diagnosis not present

## 2024-07-08 DIAGNOSIS — E1159 Type 2 diabetes mellitus with other circulatory complications: Secondary | ICD-10-CM | POA: Diagnosis present

## 2024-07-08 HISTORY — PX: BREAST BIOPSY: SHX20

## 2024-07-08 NOTE — Progress Notes (Signed)
 Medical Nutrition Therapy  Appointment Start time:  0930  Appointment End time:  1000  Primary concerns today: Dm Type 2  Referral diagnosis: E11.8 Preferred learning style: NO Preference Learning readiness: Ready   NUTRITION ASSESSMENT  Follow up A1C down to 5.6% from 6.3%. Eating more whole plant based foods. Cut out processed foods, fried foods and simple sugars. Working on increasing higher fiber foods to lower her cholesterol. Has been cutting out cheese and processed fats. Saw cardiology and they want to start Repatha injection. Lipids elevated. She is on Zoloft  and studies have shown it to cause elevated TG and LDL..Encouraged her to talk to her Cardiologist and PCP regarding this.   She is willing to work with Lifestyle Medicine and focus on whole plant based foods and the  6 pillars of health to reverse her Dm and reduce further medical problems.  Clinical Medical Hx:  Past Medical History:  Diagnosis Date   Allergy    Asthma    Back pain    Depression    GERD (gastroesophageal reflux disease)    Hyperlipidemia    Seizures (HCC) 12/14/2014   Tendinitis of left rotator cuff 07/20/2020   Transient memory loss 12/09/2014   Medications:  Current Outpatient Medications on File Prior to Visit  Medication Sig Dispense Refill   albuterol  (VENTOLIN  HFA) 108 (90 Base) MCG/ACT inhaler Inhale 2 puffs into the lungs every 6 (six) hours as needed for wheezing or shortness of breath. 1 each 0   allopurinol  (ZYLOPRIM ) 300 MG tablet TAKE 1 TABLET BY MOUTH IN THE  EVENING 100 tablet 0   Ascorbic Acid (VITAMIN C PO) Take by mouth.     atenolol  (TENORMIN ) 50 MG tablet TAKE 1 TABLET BY MOUTH DAILY 90 tablet 3   azelastine  (ASTELIN ) 0.1 % nasal spray Place 1 spray into both nostrils 2 (two) times daily. 30 mL 12   Bempedoic Acid  (NEXLETOL ) 180 MG TABS Take 1 tablet (180 mg total) by mouth daily. 30 tablet 11   BIOTIN PO Take by mouth.     Blood Glucose Monitoring Suppl DEVI Check BGs  daily. E11.65. May substitute to any manufacturer covered by patient's insurance. 1 each 0   budesonide (PULMICORT) 180 MCG/ACT inhaler Inhale 2 puffs into the lungs 2 (two) times daily.     cetirizine (ZYRTEC) 10 MG tablet Take 1 tablet by mouth daily.     cholecalciferol (VITAMIN D3) 25 MCG (1000 UT) tablet Take 1,000 Units by mouth daily.     EPINEPHrine  (EPIPEN  2-PAK) 0.3 mg/0.3 mL IJ SOAJ injection Inject 0.3 mg into the muscle as needed for anaphylaxis (then go to ER). 1 each 0   esomeprazole  (NEXIUM ) 20 MG capsule Take 1 capsule (20 mg total) by mouth daily at 12 noon. For GERD 100 capsule 3   Evolocumab (REPATHA SURECLICK) 140 MG/ML SOAJ Inject 140 mg into the skin every 14 (fourteen) days. 2 mL 11   Glucose Blood (BLOOD GLUCOSE TEST STRIPS) STRP Check BGs daily. E11.65. May substitute to any manufacturer covered by patient's insurance. 100 strip 3   ketoconazole  (NIZORAL ) 2 % cream Apply 1 Application topically daily. X7-14 days to rash 15 g 0   Lancet Device MISC Check BGs daily. E11.65. May substitute to any manufacturer covered by patient's insurance. 1 each 0   Lancets Misc. MISC Check BGs daily. E11.65. May substitute to any manufacturer covered by patient's insurance. 100 each 3   levocetirizine (XYZAL ) 5 MG tablet Take 1 tablet (5 mg total)  by mouth every evening. 90 tablet 3   montelukast  (SINGULAIR ) 10 MG tablet TAKE 1 TABLET BY MOUTH IN THE  MORNING 100 tablet 2   Multiple Vitamin (MULTIVITAMIN) capsule Take 1 capsule by mouth daily.      sertraline  (ZOLOFT ) 25 MG tablet Take 25 mg by mouth daily.     triamcinolone  ointment (KENALOG ) 0.1 % Apply 1 Application topically 2 (two) times daily. 30 g 0   No current facility-administered medications on file prior to visit.    Labs:  Lab Results  Component Value Date   HGBA1C 5.6 05/03/2024      Latest Ref Rng & Units 05/03/2024    9:03 AM 09/30/2023   11:15 AM 03/19/2023   10:46 AM  CMP  Glucose 70 - 99 mg/dL 884  841  701    BUN 8 - 27 mg/dL 20  17  12    Creatinine 0.57 - 1.00 mg/dL 8.97  8.95  9.03   Sodium 134 - 144 mmol/L 141  139  137   Potassium 3.5 - 5.2 mmol/L 4.6  4.3  4.2   Chloride 96 - 106 mmol/L 101  101  101   CO2 20 - 29 mmol/L 22  21  20    Calcium 8.7 - 10.3 mg/dL 9.6  9.6  9.3   Total Protein 6.0 - 8.5 g/dL  6.5  6.5   Total Bilirubin 0.0 - 1.2 mg/dL  0.5  0.5   Alkaline Phos 44 - 121 IU/L  101  112   AST 0 - 40 IU/L  20  50   ALT 0 - 32 IU/L  18  53    Lipid Panel     Component Value Date/Time   CHOL 172 06/16/2024 1037   TRIG 250 (H) 06/16/2024 1037   HDL 38 (L) 06/16/2024 1037   CHOLHDL 4.5 (H) 06/16/2024 1037   LDLCALC 92 06/16/2024 1037   LABVLDL 42 (H) 06/16/2024 1037    Notable Signs/Symptoms: None  Lifestyle & Dietary Hx Married and lives with her husband. Helps care for grandkids  Estimated daily fluid intake: 30 oz Supplements: VIt C, Vit D3 Sleep: varies Stress / self-care:  Current average weekly physical activity: ADL  24-Hr Dietary Recall Eats 2-3 meals per day. Tends to eat later at night sometimes.  Estimated Energy Needs Calories: 1200 Carbohydrate: 135g Protein: 90g Fat: 33g   NUTRITION DIAGNOSIS  NB-1.1 Food and nutrition-related knowledge deficit As related to Diabetes Type 2.  As evidenced by A1C 7.4%.[name]   NUTRITION INTERVENTION  Nutrition education (E-1) on the following topics:  Nutrition and Diabetes education provided on My Plate, CHO counting, meal planning, portion sizes, timing of meals, avoiding snacks between meals unless having a low blood sugar, target ranges for A1C and blood sugars, signs/symptoms and treatment of hyper/hypoglycemia, monitoring blood sugars, taking medications as prescribed, benefits of exercising 30 minutes per day and prevention of complications of DM.  Lifestyle Medicine  - Whole Food, Plant Predominant Nutrition is highly recommended: Eat Plenty of vegetables, Mushrooms, fruits, Legumes, Whole Grains, Nuts,  seeds in lieu of processed meats, processed snacks/pastries red meat, poultry, eggs.    -It is better to avoid simple carbohydrates including: Cakes, Sweet Desserts, Ice Cream, Soda (diet and regular), Sweet Tea, Candies, Chips, Cookies, Store Bought Juices, Alcohol in Excess of  1-2 drinks a day, Lemonade,  Artificial Sweeteners, Doughnuts, Coffee Creamers, Sugar-free Products, etc, etc.  This is not a complete list.....  Exercise: If you are able:  30 -60 minutes a day ,4 days a week, or 150 minutes a week.  The longer the better.  Combine stretch, strength, and aerobic activities.  If you were told in the past that you have high risk for cardiovascular diseases, you may seek evaluation by your heart doctor prior to initiating moderate to intense exercise programs.  Goals Talk to MD about Zoloft  and it's impact on lipids and possible alternative antidepressant. Keep up the great job! Get in 25 grams of fiber per day Get in 150 minutes of physical activity weekly. Get A1C to 5.7% or below.  Handouts Provided Include  Lifestyle Medicine handouts Know your numbers  Learning Style & Readiness for Change Teaching method utilized: Visual & Auditory  Demonstrated degree of understanding via: Teach Back  Barriers to learning/adherence to lifestyle change: none  Goals EstablisheKeep great job. Keep focusing on whole plant based foods Exercise 150-250 minutes per week.d by Pt Keep great job.  MONITORING & EVALUATION Dietary intake, weekly physical activity, and BS in 3-4 month.  Next Steps  Patient is to work on following Lifestyle Medicine pillars of health.[name]

## 2024-07-08 NOTE — Patient Instructions (Addendum)
 Goals Talk to MD about Zoloft  and it's impact on lipids and possible alternative antidepressant. Continue to focus on more whole plant based foods. Continue to increase fiber Zamarron foods for 25 grams or more per day Consider CoQ 10 for muscle aches and restless leg syndrome May consider red yeast rice for cardiovascular benefits. Increase exercise as able when your ankle gets well.

## 2024-07-09 ENCOUNTER — Other Ambulatory Visit: Payer: Self-pay | Admitting: *Deleted

## 2024-07-09 DIAGNOSIS — E785 Hyperlipidemia, unspecified: Secondary | ICD-10-CM

## 2024-07-09 DIAGNOSIS — E7841 Elevated Lipoprotein(a): Secondary | ICD-10-CM

## 2024-07-09 LAB — SURGICAL PATHOLOGY

## 2024-07-12 ENCOUNTER — Ambulatory Visit: Payer: Self-pay | Admitting: Surgery

## 2024-07-12 DIAGNOSIS — C50911 Malignant neoplasm of unspecified site of right female breast: Secondary | ICD-10-CM | POA: Diagnosis not present

## 2024-07-15 ENCOUNTER — Encounter: Payer: Self-pay | Admitting: *Deleted

## 2024-07-15 NOTE — Progress Notes (Signed)
 When patient was called to make initial oncologist appt she asked scheduler if all her information was here prior to the meeting with the oncologist. This navigator was asked to follow up. I called patient to see what her questions were. She has seen Dr. Vanderbilt but she wanted to make sure her necessary info would be available for oncologists. Her path report/prognostics are completed. Informed Madison (scheduler) that patient is ready to be scheduled ideally as soon as possible. Gave patient my name/number to call back for any questions. She was appreciative.

## 2024-07-16 ENCOUNTER — Encounter: Payer: Self-pay | Admitting: *Deleted

## 2024-07-17 DIAGNOSIS — C50919 Malignant neoplasm of unspecified site of unspecified female breast: Secondary | ICD-10-CM

## 2024-07-17 HISTORY — DX: Malignant neoplasm of unspecified site of unspecified female breast: C50.919

## 2024-07-20 ENCOUNTER — Inpatient Hospital Stay: Attending: Hematology | Admitting: Hematology

## 2024-07-20 ENCOUNTER — Ambulatory Visit
Admission: RE | Admit: 2024-07-20 | Discharge: 2024-07-20 | Disposition: A | Discharge status: Home / Self Care | Payer: Self-pay | Source: Ambulatory Visit | Attending: Radiation Oncology | Admitting: Radiation Oncology

## 2024-07-20 ENCOUNTER — Encounter: Payer: Self-pay | Admitting: *Deleted

## 2024-07-20 ENCOUNTER — Encounter: Payer: Self-pay | Admitting: Hematology

## 2024-07-20 ENCOUNTER — Inpatient Hospital Stay

## 2024-07-20 ENCOUNTER — Other Ambulatory Visit: Payer: Self-pay | Admitting: Radiation Oncology

## 2024-07-20 VITALS — BP 118/84 | HR 64 | Temp 97.3°F | Resp 16 | Ht 65.5 in | Wt 177.1 lb

## 2024-07-20 DIAGNOSIS — C50411 Malignant neoplasm of upper-outer quadrant of right female breast: Secondary | ICD-10-CM

## 2024-07-20 DIAGNOSIS — Z171 Estrogen receptor negative status [ER-]: Secondary | ICD-10-CM

## 2024-07-20 DIAGNOSIS — C50911 Malignant neoplasm of unspecified site of right female breast: Secondary | ICD-10-CM

## 2024-07-20 LAB — CBC WITH DIFFERENTIAL (CANCER CENTER ONLY)
Abs Immature Granulocytes: 0.02 K/uL (ref 0.00–0.07)
Basophils Absolute: 0.1 K/uL (ref 0.0–0.1)
Basophils Relative: 1 %
Eosinophils Absolute: 0.1 K/uL (ref 0.0–0.5)
Eosinophils Relative: 2 %
HCT: 38.9 % (ref 36.0–46.0)
Hemoglobin: 13.7 g/dL (ref 12.0–15.0)
Immature Granulocytes: 0 %
Lymphocytes Relative: 30 %
Lymphs Abs: 2.3 K/uL (ref 0.7–4.0)
MCH: 29.5 pg (ref 26.0–34.0)
MCHC: 35.2 g/dL (ref 30.0–36.0)
MCV: 83.8 fL (ref 80.0–100.0)
Monocytes Absolute: 0.6 K/uL (ref 0.1–1.0)
Monocytes Relative: 7 %
Neutro Abs: 4.6 K/uL (ref 1.7–7.7)
Neutrophils Relative %: 60 %
Platelet Count: 195 K/uL (ref 150–400)
RBC: 4.64 MIL/uL (ref 3.87–5.11)
RDW: 13.5 % (ref 11.5–15.5)
WBC Count: 7.7 K/uL (ref 4.0–10.5)
nRBC: 0 % (ref 0.0–0.2)

## 2024-07-20 LAB — CMP (CANCER CENTER ONLY)
ALT: 36 U/L (ref 0–44)
AST: 47 U/L — ABNORMAL HIGH (ref 15–41)
Albumin: 4.8 g/dL (ref 3.5–5.0)
Alkaline Phosphatase: 59 U/L (ref 38–126)
Anion gap: 7 (ref 5–15)
BUN: 16 mg/dL (ref 8–23)
CO2: 27 mmol/L (ref 22–32)
Calcium: 9.9 mg/dL (ref 8.9–10.3)
Chloride: 105 mmol/L (ref 98–111)
Creatinine: 0.96 mg/dL (ref 0.44–1.00)
GFR, Estimated: >60 mL/min (ref >60)
Glucose, Bld: 109 mg/dL — ABNORMAL HIGH (ref 70–99)
Potassium: 4.6 mmol/L (ref 3.5–5.1)
Sodium: 139 mmol/L (ref 135–145)
Total Bilirubin: 0.5 mg/dL (ref 0.0–1.2)
Total Protein: 7.3 g/dL (ref 6.5–8.1)

## 2024-07-20 LAB — RESEARCH LABS

## 2024-07-20 NOTE — Progress Notes (Signed)
 Breast nurse navigator attended new patient appointment with patient, husband, and Dr. Lanny. Resource information reviewed with patient and husband. Reviewed role of navigator and my contact information provided. Her only concern at this time was rescheduling her initial rad onc appt. This has now been changed to a better date for patient. Will follow up next week with patient. Dr. Lanny is contacting Dr. Vanderbilt to decide treatment order. Patient is aware to call for any needs/concerns.

## 2024-07-20 NOTE — Research (Signed)
 [employer] 2021-05 - Specimen Collection Study to Evaluate Biomarkers in Subjects with Cancer   Research lab ordered for study.   Delon Pinal BSN RN Clinical Research Nurse [facility] Direct Dial: [phone] 07/20/2024  12:45 PM

## 2024-07-20 NOTE — Progress Notes (Signed)
 [facility]   [phone] Fax:([phone]   Clinic New Consult Note   Patient Care Team: Jolinda Norene [name], DO as PCP - General (Family Medicine) Mona Vinie BROCKS, MD as PCP - Cardiology (Cardiology) Leva Rush, MD as Consulting Physician (Obstetrics and Gynecology) [facility] 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 Vanderbilt   Discussed the use of AI scribe software for clinical note transcription with the patient, who gave verbal consent to proceed.  History of Present Illness [name] is a 73 year old female with newly diagnosed triple negative breast cancer who presents for a new consult. She is accompanied by her husband. She was referred by Dr. Vanderbilt for evaluation of her breast cancer.  The breast cancer was discovered during an annual screening mammogram in early October. Her diagnostic mammogram and ultrasound of right breast revealed a 1.8 cm tumor in the upper inner quadrant of the right breast. The biopsy was performed on July 08, 2024, which showed poorly differentiated ductal carcinoma, triple negative with Ki-67 40%. She did not detect the lump herself until the mammogram when she first noticed the lump. She denies nipple discharge, inversion, or skin changes. There is no pain or discomfort in the breast area.  She experiences bilateral leg pain, described as muscular and deep, affecting her heels, especially when standing. She was taken off Nexletol  due to concerns about muscle pain, having experienced similar issues with other statins.  Her medical history includes type 2 diabetes managed with diet, hypertension, and hyperlipidemia. Surgical history includes tonsillectomy, ovarian cyst removal, bladder sling implantation, and colonoscopy with polyp removal. She takes  sertraline , atenolol , Singulair , and uses inhalers for asthma.  Family history is significant for breast cancer in her mother, diagnosed at around 77 years old, and a maternal grandmother with non-malignant lumps. Her maternal grandfather had an unspecified cancer, and her paternal grandmother had cancer in the abdominal area, possibly uterine or ovarian.     MEDICAL HISTORY:  Past Medical History:  Diagnosis Date   Allergy    Asthma    Back pain    Depression    GERD (gastroesophageal reflux disease)    Hyperlipidemia    Seizures (HCC) 12/14/2014   Tendinitis of left rotator cuff 07/20/2020   Transient memory loss 12/09/2014   Type 2 diabetes mellitus (HCC)     SURGICAL HISTORY: Past Surgical History:  Procedure Laterality Date   bladder mesh     BREAST BIOPSY Right 07/08/2024   US  RT BREAST BX W LOC DEV 1ST LESION IMG BX SPEC US  GUIDE 07/08/2024 [facility] MAMMOGRAPHY   COLONOSCOPY N/A 07/12/2015   Procedure: COLONOSCOPY;  Surgeon: Claudis RAYMOND Rivet, MD;  [facility];  Service: Endoscopy;  Laterality: N/A;  1030   COLONOSCOPY WITH PROPOFOL  N/A 11/18/2023   Procedure: COLONOSCOPY WITH PROPOFOL ;  Surgeon: Eartha Angelia Sieving, MD;  Location: [facility];  Service: Gastroenterology;  Laterality: N/A;  8:15AM;ASA 1   NM MYOCAR PERF WALL MOTION  02/15/2007   dipyridamole myoview; EF 70%, no wall motion abnormalities, no inducible ischemia, low risk    OVARIAN CYST REMOVAL Left 09/16/1985   POLYPECTOMY  11/18/2023   Procedure: POLYPECTOMY, INTESTINE;  Surgeon: Eartha Angelia Sieving, MD;  Location: [facility];  Service: Gastroenterology;;   TONSILECTOMY/ADENOIDECTOMY WITH MYRINGOTOMY  09/16/1956   TONSILLECTOMY     TRANSTHORACIC ECHOCARDIOGRAM  02/15/2007   RV mildly dlated; LV normal in size; mild MR with thickened MV leaflets; mild TR; mild pulm valve regurg   VAGINAL DELIVERY     x3    SOCIAL HISTORY: Social History   Socioeconomic History   Marital  status: Married    Spouse name: Not on file   Number of children: 3   Years of education: Not on file   Highest education level: Not on file  Occupational History   Not on file  Tobacco Use   Smoking status: Never   Smokeless tobacco: Never  Vaping Use   Vaping status: Never Used  Substance and Sexual Activity   Alcohol use: No   Drug use: No   Sexual activity: Not on file  Other Topics Concern   Not on file  Social History Narrative   Not on file   Social Drivers of Health   Financial Resource Strain: Low Risk  (08/04/2023)   Overall Financial Resource Strain (CARDIA)    Difficulty of Paying Living Expenses: Not hard at all  Food Insecurity: Low Risk  (05/05/2024)   Received from [facility]   Hunger Vital Sign    Within the past 12 months, you worried that your food would run out before you got money to buy more: Never true    Within the past 12 months, the food you bought just didn't last and you didn't have money to get more. : Never true  Transportation Needs: No Transportation Needs (05/05/2024)   Received from [employer]    In the past 12 months, has lack of reliable transportation kept you from medical appointments, meetings, work or from getting things needed for daily living? : No  Physical Activity: Insufficiently Active (08/04/2023)   Exercise Vital Sign    Days of Exercise per Week: 3 days    Minutes of Exercise per Session: 30 min  Stress: No Stress Concern Present (08/04/2023)   [employer] of Occupational Health - Occupational Stress Questionnaire    Feeling of Stress : Not at all  Social Connections: Socially Integrated (08/04/2023)   Social Connection and Isolation Panel    Frequency of Communication with Friends and Family: More than three times a week    Frequency of Social Gatherings with Friends and Family: Three times a week    Attends Religious Services: More than 4 times per year    Active Member of Clubs or  Organizations: Yes    Attends Banker Meetings: More than 4 times per year    Marital Status: Married  Catering Manager Violence: Not At Risk (08/04/2023)   Humiliation, Afraid, Rape, and Kick questionnaire    Fear of Current or Ex-Partner: No    Emotionally Abused: No    Physically Abused: No    Sexually Abused: No    FAMILY HISTORY: Family History  Problem Relation Age of Onset   Heart disease Mother    Kidney disease Mother    Breast cancer Mother    Coronary artery disease Mother        CABG at age 79   Dementia Father    Stroke Father    Prostate cancer Maternal Uncle    Stroke Maternal Grandmother    Cancer Maternal Grandfather        unknown type   Cancer Paternal Grandmother        GYN cancer   Kidney disease Paternal Grandfather     ALLERGIES:  is allergic to  bee venom, crestor [rosuvastatin], erythromycin, latex, rosuvastatin calcium, peanut-containing drug products, pravastatin , zetia  [ezetimibe ], aleve [naproxen sodium], tetracyclines & related, and vytorin [ezetimibe -simvastatin].  MEDICATIONS:  Current Outpatient Medications  Medication Sig Dispense Refill   albuterol  (VENTOLIN  HFA) 108 (90 Base) MCG/ACT inhaler Inhale 2 puffs into the lungs every 6 (six) hours as needed for wheezing or shortness of breath. 1 each 0   allopurinol  (ZYLOPRIM ) 300 MG tablet TAKE 1 TABLET BY MOUTH IN THE  EVENING 100 tablet 0   Ascorbic Acid (VITAMIN C PO) Take by mouth.     atenolol  (TENORMIN ) 50 MG tablet TAKE 1 TABLET BY MOUTH DAILY 90 tablet 3   azelastine  (ASTELIN ) 0.1 % nasal spray Place 1 spray into both nostrils 2 (two) times daily. 30 mL 12   Bempedoic Acid  (NEXLETOL ) 180 MG TABS Take 1 tablet (180 mg total) by mouth daily. 30 tablet 11   BIOTIN PO Take by mouth.     Blood Glucose Monitoring Suppl DEVI Check BGs daily. E11.65. May substitute to any manufacturer covered by patient's insurance. 1 each 0   budesonide (PULMICORT) 180 MCG/ACT inhaler Inhale 2  puffs into the lungs 2 (two) times daily.     cetirizine (ZYRTEC) 10 MG tablet Take 1 tablet by mouth daily.     cholecalciferol (VITAMIN D3) 25 MCG (1000 UT) tablet Take 1,000 Units by mouth daily.     EPINEPHrine  (EPIPEN  2-PAK) 0.3 mg/0.3 mL IJ SOAJ injection Inject 0.3 mg into the muscle as needed for anaphylaxis (then go to ER). 1 each 0   esomeprazole  (NEXIUM ) 20 MG capsule Take 1 capsule (20 mg total) by mouth daily at 12 noon. For GERD 100 capsule 3   Evolocumab (REPATHA SURECLICK) 140 MG/ML SOAJ Inject 140 mg into the skin every 14 (fourteen) days. 2 mL 11   Glucose Blood (BLOOD GLUCOSE TEST STRIPS) STRP Check BGs daily. E11.65. May substitute to any manufacturer covered by patient's insurance. 100 strip 3   ketoconazole  (NIZORAL ) 2 % cream Apply 1 Application topically daily. X7-14 days to rash 15 g 0   Lancet Device MISC Check BGs daily. E11.65. May substitute to any manufacturer covered by patient's insurance. 1 each 0   Lancets Misc. MISC Check BGs daily. E11.65. May substitute to any manufacturer covered by patient's insurance. 100 each 3   levocetirizine (XYZAL ) 5 MG tablet Take 1 tablet (5 mg total) by mouth every evening. 90 tablet 3   montelukast  (SINGULAIR ) 10 MG tablet TAKE 1 TABLET BY MOUTH IN THE  MORNING 100 tablet 2   Multiple Vitamin (MULTIVITAMIN) capsule Take 1 capsule by mouth daily.      sertraline  (ZOLOFT ) 25 MG tablet Take 25 mg by mouth daily.     triamcinolone  ointment (KENALOG ) 0.1 % Apply 1 Application topically 2 (two) times daily. 30 g 0   No current facility-administered medications for this visit.    REVIEW OF SYSTEMS:   Constitutional: Denies fevers, chills or abnormal night sweats Eyes: Denies blurriness of vision, double vision or watery eyes Ears, nose, mouth, throat, and face: Denies mucositis or sore throat Respiratory: Denies cough, dyspnea or wheezes Cardiovascular: Denies palpitation, chest discomfort or lower extremity  swelling Gastrointestinal:  Denies nausea, heartburn or change in bowel habits Skin: Denies abnormal skin rashes Lymphatics: Denies new lymphadenopathy or easy bruising Neurological:Denies numbness, tingling or new weaknesses Behavioral/Psych: Mood is stable, no new changes  All other systems were reviewed with the patient and are negative.  PHYSICAL EXAMINATION: ECOG PERFORMANCE STATUS: 0 -  Asymptomatic  Vitals:   07/20/24 1130  BP: 118/84  Pulse: 64  Resp: 16  Temp: (!) 97.3 F (36.3 C)  SpO2: 98%   Filed Weights   07/20/24 1130  Weight: 177 lb 1.6 oz (80.3 kg)    GENERAL:alert, no distress and comfortable SKIN: skin color, texture, turgor are normal, no rashes or significant lesions EYES: normal, conjunctiva are pink and non-injected, sclera clear OROPHARYNX:no exudate, no erythema and lips, buccal mucosa, and tongue normal  NECK: supple, thyroid  normal size, non-tender, without nodularity LYMPH:  no palpable lymphadenopathy in the cervical, axillary or inguinal LUNGS: clear to auscultation and percussion with normal breathing effort HEART: regular rate & rhythm and no murmurs and no lower extremity edema ABDOMEN:abdomen soft, non-tender and normal bowel sounds Musculoskeletal:no cyanosis of digits and no clubbing  PSYCH: alert & oriented x 3 with fluent speech NEURO: no focal motor/sensory deficits  Physical Exam CHEST: Lungs clear to auscultation bilaterally. CARDIOVASCULAR: Heart sounds normal. BREAST: Left breast examined, 2x3 cm area noted, likely related to biopsy bleeding.  LABORATORY DATA:  I have reviewed the data as listed    Latest Ref Rng & Units 07/20/2024   12:59 PM 05/03/2024    9:03 AM 03/19/2023   10:46 AM  CBC  WBC 4.0 - 10.5 K/uL 7.7  8.5  9.3   Hemoglobin 12.0 - 15.0 g/dL 86.2  85.3  86.1   Hematocrit 36.0 - 46.0 % 38.9  43.2  42.0   Platelets 150 - 400 K/uL 195  201  193     @cmpl @  RADIOGRAPHIC STUDIES: I have personally reviewed the  radiological images as listed and agreed with the findings in the report. US  RT BREAST BX W LOC DEV 1ST LESION IMG BX SPEC US  GUIDE Addendum Date: 07/09/2024 ADDENDUM REPORT: 07/09/2024 14:37 ADDENDUM: PATHOLOGY revealed: Breast, right, needle core biopsy, 1:00 (heart clip)- INVASIVE POORLY DIFFERENTIATED DUCTAL ADENOCARCINOMA WITH EXTENSIVE NECROSIS, GRADE 3 (3+2+3) OVERALL GRADE: GRADE 3 (8/9). NEGATIVE FOR ANGIOLYMPHATIC INVASION TUMOR MEASURES 10.5 MM IN GREATEST LINEAR EXTENT. Diagnosis Note: Immunohistochemical stains for the breast prognostic markers have been ordered, and these results will be issued within a subsequent addendum to this report. Pathology results are CONCORDANT with imaging findings, per Dr. Inocente Ast. Pathology results and recommendations were discussed with patient via telephone on 07/09/2024 by Rock Hover RN. Patient reported biopsy site doing well with no adverse symptoms, and slight tenderness at the site. Post biopsy care instructions were reviewed, questions were answered and my direct phone number was provided. Patient was instructed to call [facility] for any additional questions or concerns related to biopsy site. RECOMMENDATION: Surgical and oncological consultation. Per patient request, request for surgical consultation relayed to Olam Bunnell at [facility] on 07/09/2024 by Rock Hover RN. Message sent to Devere Collet RN on 07/09/2024 regarding oncology appointment at [facility]. Pathology results reported by Rock Hover RN on 07/09/2024. Electronically Signed   By: Inocente Ast M.D.   On: 07/09/2024 14:37   Result Date: 07/09/2024 CLINICAL DATA:  73 year old female presenting for biopsy of a mass in the right breast. EXAM: ULTRASOUND GUIDED RIGHT BREAST CORE NEEDLE BIOPSY COMPARISON:  Previous exam(s). PROCEDURE: I met with the patient and we discussed the procedure of ultrasound-guided biopsy, including benefits and alternatives.  We discussed the high likelihood of a successful procedure. We discussed the risks of the procedure, including infection, bleeding, tissue injury, clip migration, and inadequate sampling. Informed written consent was given. The usual  time-out protocol was performed immediately prior to the procedure. Lesion quadrant: Upper inner quadrant Using sterile technique and 1% Lidocaine  as local anesthetic, under direct ultrasound visualization, a 12 gauge spring-loaded device was used to perform biopsy of a mass in the right breast at 1 o'clock using a medial approach. At the conclusion of the procedure heart shaped tissue marker clip was deployed into the biopsy cavity. Follow up 2 view mammogram was performed and dictated separately. IMPRESSION: Ultrasound guided biopsy of a mass in the right breast at 1 o'clock. No apparent complications. Electronically Signed: By: Inocente Ast M.D. On: 07/08/2024 13:55   MM CLIP PLACEMENT RIGHT Result Date: 07/08/2024 CLINICAL DATA:  Post procedure mammogram for clip placement EXAM: 3D DIAGNOSTIC RIGHT MAMMOGRAM POST ULTRASOUND BIOPSY COMPARISON:  Previous exam(s). ACR Breast Density Category c: The breasts are heterogeneously dense, which may obscure small masses. FINDINGS: 3D Mammographic images were obtained following ultrasound guided biopsy of a mass in the right breast at 1 o'clock. The heart biopsy marking clip is in expected position at the site of biopsy. IMPRESSION: Appropriate positioning of the heart shaped biopsy marking clip at the site of biopsy in the right breast at 1 o'clock. Final Assessment: Post Procedure Mammograms for Marker Placement Electronically Signed   By: Inocente Ast M.D.   On: 07/08/2024 13:54   US  LIMITED ULTRASOUND INCLUDING AXILLA RIGHT BREAST Result Date: 07/06/2024 CLINICAL DATA:  73 year old presenting as a recall from screening for right breast mass. EXAM: ULTRASOUND OF THE RIGHT BREAST COMPARISON:  Previous exams. FINDINGS: On  physical exam, in the upper inner right breast I feel a discrete mass. Targeted ultrasound is performed at 1 o'clock 8 cm from the nipple in the right breast demonstrating an irregular hypoechoic mass measuring 1.8 x 1.7 x 1.5 cm. This corresponds to the mammographic finding. Targeted ultrasound of the right axilla demonstrates normal lymph nodes. IMPRESSION: Suspicious mass in the right breast at 1 o'clock measuring 1.8 cm. RECOMMENDATION: Ultrasound-guided core needle biopsy x1 of the right breast. I have discussed the findings and recommendations with the patient. If applicable, a reminder letter will be sent to the patient regarding the next appointment. BI-RADS CATEGORY  4: Suspicious. Electronically Signed   By: Inocente Ast M.D.   On: 07/06/2024 10:29    Assessment & Plan Malignant neoplasm of upper-outer quadrant of right breast, [id number], stage IB, G3, triple negative - This was discovered by screening mammogram.  Her diagnostic MM and US  showed a1.8 cm tumor in the upper inner quadrant of the right breast, biopsy showed poorly differentiated ductal carcinoma, grade 3, indicating aggressive biology. ER, PR, and HER2 negative, consistent with triple negative breast cancer. LKI-67 proliferation marker is 40%, indicating high growth rate. -Discussed treatment options including neoadjuvant chemotherapy versus surgery first. Neoadjuvant chemotherapy allows assessment of tumor response, while surgery first provides exact tumor size and lymph node status. Discussed potential side effects of chemotherapy, including nausea and hair loss, and the option of DigniCap for hair preservation. Chemotherapy is systemic to prevent metastasis, while radiation is local to prevent recurrence in the breast. Triple negative breast cancer has a 40-50% chance of resolving after chemotherapy, but surgery is still required to confirm cancer removal. - Referred to genetic counselor for genetic testing due to family history  of cancer. - Will discuss with Dr. Vanderbilt regarding the sequence of treatment (chemotherapy first or surgery first). - If chemotherapy first, will schedule port placement for chemotherapy administration. - If chemotherapy first, will order breast  MRI before and after chemotherapy to assess tumor response. - If surgery first, will schedule surgery and then plan for chemotherapy post-recovery. - Will plan for radiation therapy post-surgery to prevent local recurrence. - Provided educational materials on breast cancer and treatment options. - Scheduled blood tests for genetic testing.  Plan - I have reviewed her images and biopsy results, and discussed results with patient in detail. - I spoke with Dr. Vanderbilt after clinic today, and we decided to proceed with surgery first with port placement, I will see her 2 weeks after surgery to finalize her treatment plan. - Genetic referral - Per her request, we will reschedule her rad/onc appointment.   Orders Placed This Encounter  Procedures   CBC with Differential (Cancer Center Only)    Standing Status:   Future    Number of Occurrences:   1    Expiration Date:   07/20/2025   CMP (Cancer Center only)    Standing Status:   Future    Number of Occurrences:   1    Expiration Date:   07/20/2025   Ambulatory referral to Genetics    Referral Priority:   Routine    Referral Type:   Consultation    Referral Reason:   Specialty Services Required    Number of Visits Requested:   1    All questions were answered. The patient knows to call the clinic with any problems, questions or concerns. I spent 55 minutes counseling the patient face to face. The total time spent in the appointment was 60 minutes including review of chart and various tests results, discussions about plan of care and coordination of care plan.     Onita Mattock, MD 07/20/2024

## 2024-07-20 NOTE — Research (Signed)
 [employer] 2021-05 - Specimen Collection Study to Evaluate Biomarkers in Subjects with Cancer   This Nurse has reviewed this patient's inclusion and exclusion criteria as a second review and confirms [name] is eligible for study participation.  Patient may continue with enrollment.  Delon Pinal BSN RN Clinical Research Nurse [facility] Direct Dial: [phone] 07/20/2024  1:43 PM

## 2024-07-20 NOTE — Research (Signed)
 Trial: [employer] 2021-05 - Specimen Collection Study to Evaluate Biomarkers in Subjects with Cancer    Patient [name] was identified by Dr. Lanny as a potential candidate for the above listed study.  This Clinical Research Nurse met with [name], [id number], on 07/20/24 in a manner and location that ensures patient privacy to discuss participation in the above listed research study.  Patient is Accompanied by her husband.  A copy of the informed consent document with embedded HIPAA language was provided to the patient.  Patient reads, speaks, and understands English.    Patient was provided with the business card of this Nurse and encouraged to contact the research team with any questions.  Patient was provided the option of taking informed consent documents home to review and was encouraged to review at their convenience with their support network, including other care providers. Patient is comfortable with making a decision regarding study participation today.  As outlined in the informed consent form, this Nurse and Dlynn J Costello discussed the purpose of the research study, the investigational nature of the study, study procedures and requirements for study participation, potential risks and benefits of study participation, as well as alternatives to participation. This study is not blinded. The patient understands participation is voluntary and they may withdraw from study participation at any time.  This study does not involve randomization.  This study does not involve an investigational drug or device. This study does not involve a placebo. Patient understands enrollment is pending full eligibility review.   Confidentiality and how the patient's information will be used as part of study participation were discussed.  Patient was informed there is reimbursement provided for their time and effort spent on trial participation.  The patient is encouraged to discuss research study  participation with their insurance provider to determine what costs they may incur as part of study participation, including research related injury.    All questions were answered to patient's satisfaction.  The informed consent with embedded HIPAA language was reviewed page by page.  The patient's mental and emotional status is appropriate to provide informed consent, and the patient verbalizes an understanding of study participation.  Patient has agreed to participate in the above listed research study and has voluntarily signed the informed consent 18 May 2024 version on 07/20/24 at 12:35PM.  The patient was provided with a copy of the signed informed consent form with embedded HIPAA language for their reference.  No study specific procedures were obtained prior to the signing of the informed consent document.  Approximately 20 minutes were spent with the patient reviewing the informed consent documents.  Patient was not requested to complete a Release of Information form.   Eligibility: Eligibility criteria reviewed with patient. This nurse has reviewed this patient's inclusion and exclusion criteria and confirmed patient is eligible for study participation. Eligibility confirmed by treating investigator, who also agrees that patient should proceed with enrollment. Patient will continue with enrollment.  Data Collection: Patient was interviewed to collect the following information.   Has participant been diagnosed with: High Blood Pressure   Yes Coronary Artery Disease                No Myocardial Infarction                       No Congestive Heart Failure               No Peripheral Vascular Disease  No Cerebrovascular Disease              No Chronic Pulmonary Disease             No COPD (incl Emphysema,Chronic Bronchitis)    No Lupus                               No Rheumatoid Arthritis         No Rheumatoid Disease         No Diabetes                  Yes            If yes, Type  1 or Type 2   Type 2                          If yes, was there end Organ damage?   No Dementia                         No Hemiplegia or Paraplegia No Barrett's Esophagus       No Gastric Ulcer                 No Peptic Ulcer Disease      No Mild Liver Disease           No Moderate or Severe Liver Disease  No Liver Cirrhosis                                 No Helicobacter Pylori (H. Pylori)          No Pancreatitis                                       No Renal Disease                                No Chronic Kidney Disease (CKD)   No Ulcerative Colitis     No Crohn's Disease    No Colorectal Polyps   No Lynch Syndrome    No Hepatitis B or C     No   Does the patient have a personal history of cancer (greater than 5 years ago)?  No  Does the patient have a family history of cancer in 1st or 2nd degree relatives? Yes If yes, Relationship(s) and Cancer type(s)? Mother had Breast Cancer. Uncle had Prostate Cancer. Grandmother had Uterine Cancer. Grandfather had some type of cancer but it is not known to patient.   Does the patient have history of alcohol consumption? No    Does the patient have history of cigarette, cigar, pipe, or chewing tobacco use?  No   Blood Collection: Research blood obtained by fresh venipuncture. Patient tolerated well without any adverse events.  Gift Card: $50 gift card given to patient for her participation in this study.    Patient was thanked for their participation in this study.    Cherylyn Hoard, BSN, RN, [employer] Research Nurse II [phone] 07/20/2024

## 2024-07-21 ENCOUNTER — Ambulatory Visit: Payer: Self-pay | Admitting: Surgery

## 2024-07-21 ENCOUNTER — Encounter: Payer: Self-pay | Admitting: *Deleted

## 2024-07-21 ENCOUNTER — Telehealth: Payer: Self-pay | Admitting: Hematology

## 2024-07-21 NOTE — Telephone Encounter (Signed)
 Called the pt and she scheduled and she is aware of her appts and times.

## 2024-07-22 ENCOUNTER — Ambulatory Visit: Admitting: Radiation Oncology

## 2024-07-22 ENCOUNTER — Ambulatory Visit

## 2024-07-22 ENCOUNTER — Encounter: Payer: Self-pay | Admitting: *Deleted

## 2024-07-22 NOTE — Progress Notes (Signed)
 Patient called navigator with more family genetic history she remembered after appointment. Stated she had two cousins on her father's side with liver cancer. (They are cousins to each other). The female cousin died from liver cancer. The female cousin's cancer may have started as prostate but spread to liver and is living. Patient's brother had leukemia dx in 2012 and is currently in remission.

## 2024-07-23 ENCOUNTER — Inpatient Hospital Stay: Admitting: Licensed Clinical Social Worker

## 2024-07-23 ENCOUNTER — Other Ambulatory Visit: Payer: Self-pay | Admitting: Surgery

## 2024-07-23 DIAGNOSIS — C50411 Malignant neoplasm of upper-outer quadrant of right female breast: Secondary | ICD-10-CM

## 2024-07-23 DIAGNOSIS — C50911 Malignant neoplasm of unspecified site of right female breast: Secondary | ICD-10-CM

## 2024-07-26 ENCOUNTER — Telehealth: Payer: Self-pay | Admitting: *Deleted

## 2024-07-26 NOTE — Progress Notes (Signed)
 [facility] Clinical Social Work  Initial Assessment   [name] is a 73 y.o. year old female contacted by phone. Clinical Social Work was referred by medical provider for assessment of psychosocial needs.   SDOH (Social Determinants of Health) assessments performed: Yes SDOH Interventions    Flowsheet Row Office Visit from 04/15/2024 in [facility] Clinical Support from 08/04/2023 in [facility] Clinical Support from 08/02/2022 in [facility] Office Visit from 07/31/2021 in [facility]  SDOH Interventions      Food Insecurity Interventions -- Intervention Not Indicated Intervention Not Indicated Intervention Not Indicated  Housing Interventions -- Intervention Not Indicated Intervention Not Indicated Intervention Not Indicated  Transportation Interventions -- Intervention Not Indicated Intervention Not Indicated Intervention Not Indicated  Utilities Interventions -- Intervention Not Indicated -- --  Alcohol Usage Interventions -- Intervention Not Indicated (Score <7) -- --  Depression Interventions/Treatment  PHQ2-9 Score <4 Follow-up Not Indicated -- -- --  Financial Strain Interventions -- Intervention Not Indicated Intervention Not Indicated Intervention Not Indicated  Physical Activity Interventions -- Intervention Not Indicated Intervention Not Indicated --  Stress Interventions -- Intervention Not Indicated Intervention Not Indicated --  Social Connections Interventions -- Intervention Not Indicated Intervention Not Indicated Intervention Not Indicated  Health Literacy Interventions -- Intervention Not Indicated -- --    SDOH Screenings   Food Insecurity: Low Risk  (05/05/2024)   Received from [facility]  Housing: Unknown (07/12/2024)   Received from [facility]  Transportation Needs: No Transportation Needs (05/05/2024)    Received from [facility]  Utilities: Low Risk  (05/05/2024)   Received from [facility]  Alcohol Screen: Low Risk  (08/04/2023)  Depression (PHQ2-9): Low Risk  (07/20/2024)  Financial Resource Strain: Low Risk  (08/04/2023)  Physical Activity: Insufficiently Active (08/04/2023)  Social Connections: Socially Integrated (08/04/2023)  Stress: No Stress Concern Present (08/04/2023)  Tobacco Use: Low Risk  (07/20/2024)  Health Literacy: Adequate Health Literacy (08/04/2023)    PHQ 2/9:    07/20/2024   11:42 AM 05/03/2024    9:12 AM 04/15/2024    8:06 AM  Depression screen PHQ 2/9  Decreased Interest 0 0 0  Down, Depressed, Hopeless 0 0 0  PHQ - 2 Score 0 0 0  Altered sleeping   1  Tired, decreased energy   0  Change in appetite   0  Feeling bad or failure about yourself    0  Trouble concentrating   0  Moving slowly or fidgety/restless   0  Suicidal thoughts   0  PHQ-9 Score   1   Difficult doing work/chores  Not difficult at all Not difficult at all     Data saved with a previous flowsheet row definition     Distress Screen completed: No     No data to display            Family/Social Information:  Housing Arrangement: patient lives with her husband Family members/support persons in your life? Pt has three daughters, but they are not local.  Pt reports her sister is nearby and she also has cousins who are able to offer support as needed.  Per pt some members of her family have medical backgrounds professionally which has also been helpful for understanding diagnosis and treatment. Transportation concerns: no  Employment: Retired .  Income source: Actor concerns: No Type of concern: None Food access  concerns: no Religious or spiritual practice: Yes-Christian Advanced directives: No- pt requested an advanced directives packet be mailed to her, which was done, and pt will sign up for a clinic when she is ready to complete the documents.   Services Currently in place:  none   Coping/ Adjustment to diagnosis: Patient understands treatment plan and what happens next? yes Concerns about diagnosis and/or treatment: Overwhelmed by information Patient reported stressors: Anxiety/ nervousness and Adjusting to my illness Hopes and/or priorities: pt's priority is to start treatment w/ the hope of positive results. Patient enjoys time with family/ friends Current coping skills/ strengths: Capable of independent living , Motivation for treatment/growth , Physical Health , and Supportive family/friends     SUMMARY: Current SDOH Barriers:  No barriers identified at this time.   Clinical Social Work Clinical Goal(s):  No clinical social work goals at this time  Interventions: Discussed common feeling and emotions when being diagnosed with cancer, and the importance of support during treatment Informed patient of the support team roles and support services at [facility] Provided CSW contact information and encouraged patient to call with any questions or concerns Referred patient to community resources: Wadie Rung    Follow Up Plan: Patient will contact CSW with any support or resource needs Patient verbalizes understanding of plan: Yes    Devere JONELLE Manna, LCSW Clinical Social Worker [facility]

## 2024-07-27 ENCOUNTER — Telehealth: Payer: Self-pay | Admitting: Hematology

## 2024-07-27 NOTE — Telephone Encounter (Signed)
 Called the apt and got her scheduled and she confirm her time and date.

## 2024-07-28 NOTE — Telephone Encounter (Signed)
 Opened in error

## 2024-07-29 ENCOUNTER — Encounter: Payer: Self-pay | Admitting: *Deleted

## 2024-07-29 DIAGNOSIS — C50411 Malignant neoplasm of upper-outer quadrant of right female breast: Secondary | ICD-10-CM

## 2024-07-31 ENCOUNTER — Other Ambulatory Visit: Payer: Self-pay | Admitting: *Deleted

## 2024-07-31 DIAGNOSIS — Z8739 Personal history of other diseases of the musculoskeletal system and connective tissue: Secondary | ICD-10-CM

## 2024-08-02 NOTE — Progress Notes (Signed)
 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  Histology per Pathology Report: grade 3, Invasive Ductal Carcinoma  Receptor Status: ER(negative), PR (negative), Her2-neu (negative), Ki-(40%)   Surgeon and surgical plan, if any:  Dr. Vanderbilt -Right breast lumpectomy with radioactive seed localization and SLN biopsy 08/10/2024   Medical oncologist, treatment if any:   Dr. Lanny 07/20/2024 - Referred to genetic counselor for genetic testing due to family history of cancer. -  I spoke with Dr. Vanderbilt after clinic today, and we decided to proceed with surgery first with port placement, I will see her 2 weeks after surgery to finalize her treatment plan.  - If chemotherapy first, will schedule port placement for chemotherapy administration. - If chemotherapy first, will order breast MRI before and after chemotherapy to assess tumor response. - If surgery first, will schedule surgery and then plan for chemotherapy post-recovery. - Will plan for radiation therapy post-surgery to prevent local recurrence.   Family History of Breast/Ovarian/Prostate Cancer: Paternal cousin and maternal uncle had prostate cancer, Mother had breast cancer.  Lymphedema issues, if any: Little bit at biopsy site.     Pain issues, if any: None    SAFETY ISSUES: Prior radiation?  Bilateral earlobe keloids, done when she was 16, 3 fractions. Pacemaker/ICD? No Possible current pregnancy? Postmenopausal Is the patient on methotrexate? No  Current Complaints / other details:   September 20 2024

## 2024-08-02 NOTE — Progress Notes (Signed)
 Radiation Oncology         [phone] ________________________________  Name: [name]        MRN: [id number]  Date of Service: 08/03/2024 DOB: 12-Jul-1951  RR:Hnuudryjox, Norene [name], DO  Cornett, Debby, MD     REFERRING PHYSICIAN: Vanderbilt Debby, MD   DIAGNOSIS: The encounter diagnosis was Malignant neoplasm of upper-outer quadrant of right breast in female, estrogen receptor negative (HCC).   HISTORY OF PRESENT ILLNESS: [name] is a 73 y.o. female seen at the request of Dr. Vanderbilt for a new diagnosis of right breast cancer. The patient was found to have a screening detected left breast asymmetry which resolved on diagnostic testing in 2024 and recommendations were to repeat in a year. She returned for screening on 06/15/24 and the left showed no abnormalities, but the right showed a possible mass. She underwent diagnostic work up on 07/06/24, and by ultrasound a mass was located in the 1:00 position measuring 1.8 cm consistent with the mammographic finding, and her axilla showed normal appearing nodes. 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 The cancer was triple negative with a Ki 67 of 40%. She's seen today to discuss treatment recommendations of her cancer. She is planning to undergo lumpectomy with sentinel node biopsy next week, and then proceed with adjuvant chemotherapy. She's seen to discuss adjuvant radiotherapy at the appropriate time.     PREVIOUS RADIATION THERAPY: {EXAM; YES/NO:19492::No}   PAST MEDICAL HISTORY:  Past Medical History:  Diagnosis Date   Allergy    Asthma    Back pain    Depression    GERD (gastroesophageal reflux disease)    Hyperlipidemia    Seizures (HCC) 12/14/2014   Tendinitis of left rotator cuff 07/20/2020   Transient memory loss 12/09/2014   Type 2 diabetes mellitus (HCC)        PAST SURGICAL HISTORY: Past Surgical History:  Procedure Laterality  Date   bladder mesh     BREAST BIOPSY Right 07/08/2024   US  RT BREAST BX W LOC DEV 1ST LESION IMG BX SPEC US  GUIDE 07/08/2024 [facility] MAMMOGRAP[facility]   COLONOSCOPY N/A 07/12/2015   Procedure: COLONOSCOPY;  Surgeon: Claudis RAYMOND Rivet, MD;  [facility];  Service: Endoscopy;  Laterality: N/A;  1030   COLONOSCOPY WITH PROPOFOL  N/A 11/18/2023   Procedure: COLONOSCOPY WITH PROPOFOL ;  Surgeon: Eartha Angelia Sieving, MD;  Location: [facility];  Service: Gastroenterology;  Laterality: N/A;  8:15AM;ASA 1   NM MYOCAR PERF WALL MOTION  02/15/2007   dipyridamole myoview; EF 70%, no wall motion abnormalities, no inducible ischemia, low risk    OVARIAN CYST REMOVAL Left 09/16/1985   POLYPECTOMY  11/18/2023   Procedure: POLYPECTOMY, INTESTINE;  Surgeon: Eartha Angelia, Sieving, MD;  Location: [facility];  Service: Gastroenterology;;   TONSILECTOMY/ADENOIDECTOMY WITH MYRINGOTOMY  09/16/1956   TONSILLECTOMY     TRANSTHORACIC ECHOCARDIOGRAM  02/15/2007   RV mildly dlated; LV normal in size; mild MR with thickened MV leaflets; mild TR; mild pulm valve regurg   VAGINAL DELIVERY     x3     FAMILY HISTORY:  Family History  Problem Relation Age of Onset   Heart disease Mother    Kidney disease Mother    Breast cancer Mother    Coronary artery disease Mother        CABG at age 48   Dementia Father    Stroke Father  Leukemia Brother        Dx 2012 in remission   Stroke Maternal Grandmother    Cancer Maternal Grandfather        unknown type   Cancer Paternal Grandmother        GYN cancer   Kidney disease Paternal Grandfather    Prostate cancer Maternal Uncle    Liver cancer Paternal Cousin    Prostate cancer Paternal Cousin        mets to liver     SOCIAL HISTORY:  reports that she has never smoked. She has never used smokeless tobacco. She reports that she does not drink alcohol and does not use drugs. The patient is married and lives in [place]. She ***   ALLERGIES: Bee  venom, Crestor [rosuvastatin], Erythromycin, Latex, Rosuvastatin calcium, Peanut-containing drug products, Pravastatin , Zetia  [ezetimibe ], Aleve [naproxen sodium], Tetracyclines & related, and Vytorin [ezetimibe -simvastatin]   MEDICATIONS:  Current Outpatient Medications  Medication Sig Dispense Refill   albuterol  (VENTOLIN  HFA) 108 (90 Base) MCG/ACT inhaler Inhale 2 puffs into the lungs every 6 (six) hours as needed for wheezing or shortness of breath. 1 each 0   allopurinol  (ZYLOPRIM ) 300 MG tablet TAKE 1 TABLET BY MOUTH IN THE  EVENING 100 tablet 0   Ascorbic Acid (VITAMIN C PO) Take by mouth.     atenolol  (TENORMIN ) 50 MG tablet TAKE 1 TABLET BY MOUTH DAILY 90 tablet 3   azelastine  (ASTELIN ) 0.1 % nasal spray Place 1 spray into both nostrils 2 (two) times daily. 30 mL 12   Bempedoic Acid  (NEXLETOL ) 180 MG TABS Take 1 tablet (180 mg total) by mouth daily. 30 tablet 11   BIOTIN PO Take by mouth.     Blood Glucose Monitoring Suppl DEVI Check BGs daily. E11.65. May substitute to any manufacturer covered by patient's insurance. 1 each 0   budesonide (PULMICORT) 180 MCG/ACT inhaler Inhale 2 puffs into the lungs 2 (two) times daily.     cetirizine (ZYRTEC) 10 MG tablet Take 1 tablet by mouth daily.     cholecalciferol (VITAMIN D3) 25 MCG (1000 UT) tablet Take 1,000 Units by mouth daily.     EPINEPHrine  (EPIPEN  2-PAK) 0.3 mg/0.3 mL IJ SOAJ injection Inject 0.3 mg into the muscle as needed for anaphylaxis (then go to ER). 1 each 0   esomeprazole  (NEXIUM ) 20 MG capsule Take 1 capsule (20 mg total) by mouth daily at 12 noon. For GERD 100 capsule 3   Evolocumab (REPATHA SURECLICK) 140 MG/ML SOAJ Inject 140 mg into the skin every 14 (fourteen) days. 2 mL 11   Glucose Blood (BLOOD GLUCOSE TEST STRIPS) STRP Check BGs daily. E11.65. May substitute to any manufacturer covered by patient's insurance. 100 strip 3   ketoconazole  (NIZORAL ) 2 % cream Apply 1 Application topically daily. X7-14 days to rash 15 g 0    Lancet Device MISC Check BGs daily. E11.65. May substitute to any manufacturer covered by patient's insurance. 1 each 0   Lancets Misc. MISC Check BGs daily. E11.65. May substitute to any manufacturer covered by patient's insurance. 100 each 3   levocetirizine (XYZAL ) 5 MG tablet Take 1 tablet (5 mg total) by mouth every evening. 90 tablet 3   montelukast  (SINGULAIR ) 10 MG tablet TAKE 1 TABLET BY MOUTH IN THE  MORNING 100 tablet 2   Multiple Vitamin (MULTIVITAMIN) capsule Take 1 capsule by mouth daily.      sertraline  (ZOLOFT ) 25 MG tablet Take 25 mg by mouth daily.     triamcinolone   ointment (KENALOG ) 0.1 % Apply 1 Application topically 2 (two) times daily. 30 g 0   No current facility-administered medications for this visit.     REVIEW OF SYSTEMS: On review of systems, the patient reports that she is doing ***     PHYSICAL EXAM:  Wt Readings from Last 3 Encounters:  07/20/24 177 lb 1.6 oz (80.3 kg)  05/03/24 175 lb (79.4 kg)  04/15/24 178 lb (80.7 kg)   Temp Readings from Last 3 Encounters:  07/20/24 (!) 97.3 F (36.3 C) (Temporal)  05/03/24 98 F (36.7 C)  04/15/24 97.8 F (36.6 C)   BP Readings from Last 3 Encounters:  07/20/24 118/84  05/03/24 128/81  04/15/24 135/75   Pulse Readings from Last 3 Encounters:  07/20/24 64  05/03/24 67  04/15/24 64    In general this is a well appearing *** female in no acute distress. She's alert and oriented x4 and appropriate throughout the examination. Cardiopulmonary assessment is negative for acute distress and she exhibits normal effort. Bilateral breast exam is deferred.    ECOG = ***  0 - Asymptomatic (Fully active, able to carry on all predisease activities without restriction)  1 - Symptomatic but completely ambulatory (Restricted in physically strenuous activity but ambulatory and able to carry out work of a light or sedentary nature. For example, light housework, office work)  2 - Symptomatic, <50% in bed during  the day (Ambulatory and capable of all self care but unable to carry out any work activities. Up and about more than 50% of waking hours)  3 - Symptomatic, >50% in bed, but not bedbound (Capable of only limited self-care, confined to bed or chair 50% or more of waking hours)  4 - Bedbound (Completely disabled. Cannot carry on any self-care. Totally confined to bed or chair)  5 - Death   Raylene MM, Creech RH, Tormey DC, et al. [phone]). Toxicity and response criteria of the [facility]. Am. DOROTHA Bridges. Oncol. 5 (6): 649-55    LABORATORY DATA:  Lab Results  Component Value Date   WBC 7.7 07/20/2024   HGB 13.7 07/20/2024   HCT 38.9 07/20/2024   MCV 83.8 07/20/2024   PLT 195 07/20/2024   Lab Results  Component Value Date   NA 139 07/20/2024   K 4.6 07/20/2024   CL 105 07/20/2024   CO2 27 07/20/2024   Lab Results  Component Value Date   ALT 36 07/20/2024   AST 47 (H) 07/20/2024   ALKPHOS 59 07/20/2024   BILITOT 0.5 07/20/2024      RADIOGRAPHY: US  RT BREAST BX W LOC DEV 1ST LESION IMG BX SPEC US  GUIDE Addendum Date: 07/09/2024 ADDENDUM REPORT: 07/09/2024 14:37 ADDENDUM: PATHOLOGY revealed: Breast, right, needle core biopsy, 1:00 (heart clip)- INVASIVE POORLY DIFFERENTIATED DUCTAL ADENOCARCINOMA WITH EXTENSIVE NECROSIS, GRADE 3 (3+2+3) OVERALL GRADE: GRADE 3 (8/9). NEGATIVE FOR ANGIOLYMPHATIC INVASION TUMOR MEASURES 10.5 MM IN GREATEST LINEAR EXTENT. Diagnosis Note: Immunohistochemical stains for the breast prognostic markers have been ordered, and these results will be issued within a subsequent addendum to this report. Pathology results are CONCORDANT with imaging findings, per Dr. Inocente Ast. Pathology results and recommendations were discussed with patient via telephone on 07/09/2024 by Rock Hover RN. Patient reported biopsy site doing well with no adverse symptoms, and slight tenderness at the site. Post biopsy care instructions were reviewed, questions  were answered and my direct phone number was provided. Patient was instructed to call [facility] for  any additional questions or concerns related to biopsy site. RECOMMENDATION: Surgical and oncological consultation. Per patient request, request for surgical consultation relayed to Olam Bunnell at [facility] on 07/09/2024 by Rock Hover RN. Message sent to Devere Collet RN on 07/09/2024 regarding oncology appointment at [facility]. Pathology results reported by Rock Hover RN on 07/09/2024. Electronically Signed   By: Inocente Ast M.D.   On: 07/09/2024 14:37   Result Date: 07/09/2024 CLINICAL DATA:  73 year old female presenting for biopsy of a mass in the right breast. EXAM: ULTRASOUND GUIDED RIGHT BREAST CORE NEEDLE BIOPSY COMPARISON:  Previous exam(s). PROCEDURE: I met with the patient and we discussed the procedure of ultrasound-guided biopsy, including benefits and alternatives. We discussed the high likelihood of a successful procedure. We discussed the risks of the procedure, including infection, bleeding, tissue injury, clip migration, and inadequate sampling. Informed written consent was given. The usual time-out protocol was performed immediately prior to the procedure. Lesion quadrant: Upper inner quadrant Using sterile technique and 1% Lidocaine  as local anesthetic, under direct ultrasound visualization, a 12 gauge spring-loaded device was used to perform biopsy of a mass in the right breast at 1 o'clock using a medial approach. At the conclusion of the procedure heart shaped tissue marker clip was deployed into the biopsy cavity. Follow up 2 view mammogram was performed and dictated separately. IMPRESSION: Ultrasound guided biopsy of a mass in the right breast at 1 o'clock. No apparent complications. Electronically Signed: By: Inocente Ast M.D. On: 07/08/2024 13:55   MM CLIP PLACEMENT RIGHT Result Date: 07/08/2024 CLINICAL DATA:  Post procedure mammogram  for clip placement EXAM: 3D DIAGNOSTIC RIGHT MAMMOGRAM POST ULTRASOUND BIOPSY COMPARISON:  Previous exam(s). ACR Breast Density Category c: The breasts are heterogeneously dense, which may obscure small masses. FINDINGS: 3D Mammographic images were obtained following ultrasound guided biopsy of a mass in the right breast at 1 o'clock. The heart biopsy marking clip is in expected position at the site of biopsy. IMPRESSION: Appropriate positioning of the heart shaped biopsy marking clip at the site of biopsy in the right breast at 1 o'clock. Final Assessment: Post Procedure Mammograms for Marker Placement Electronically Signed   By: Inocente Ast M.D.   On: 07/08/2024 13:54   US  LIMITED ULTRASOUND INCLUDING AXILLA RIGHT BREAST Result Date: 07/06/2024 CLINICAL DATA:  73 year old presenting as a recall from screening for right breast mass. EXAM: ULTRASOUND OF THE RIGHT BREAST COMPARISON:  Previous exams. FINDINGS: On physical exam, in the upper inner right breast I feel a discrete mass. Targeted ultrasound is performed at 1 o'clock 8 cm from the nipple in the right breast demonstrating an irregular hypoechoic mass measuring 1.8 x 1.7 x 1.5 cm. This corresponds to the mammographic finding. Targeted ultrasound of the right axilla demonstrates normal lymph nodes. IMPRESSION: Suspicious mass in the right breast at 1 o'clock measuring 1.8 cm. RECOMMENDATION: Ultrasound-guided core needle biopsy x1 of the right breast. I have discussed the findings and recommendations with the patient. If applicable, a reminder letter will be sent to the patient regarding the next appointment. BI-RADS CATEGORY  4: Suspicious. Electronically Signed   By: Inocente Ast M.D.   On: 07/06/2024 10:29       IMPRESSION/PLAN: 1. Stage IB, [id number], grade 3, Triple Negative Invasive Ductal Carcinoma of the Right Breast. Dr. Dewey discusses the pathology findings and reviews the nature of triple negative breast disease. The patient has  been offered breast conservation with lumpectomy with sentinel node biopsy which she is scheduled  to undergo on 08/10/24. Dr. Lanny recommends adjuvant chemotherapy. After completing chemotherapy, Dr. Dewey recommends external radiotherapy to the breast  to reduce risks of local recurrence.  We discussed the risks, benefits, short, and long term effects of radiotherapy, as well as the curative intent, and the patient is interested in proceeding. Dr. Dewey discusses the delivery and logistics of radiotherapy and anticipates a course of 4 or up to 6 1/2 weeks of radiotherapy to the right breast. We will see her back a few weeks after completing chemotherapy to discuss the simulation process and anticipate we starting radiotherapy about 4-6 weeks from her last chemotherapy treatment. 2. Possible genetic predisposition to malignancy. The patient is a candidate for genetic testing given her personal and family history. She is scheduled to have testing in January 2025.   In a visit lasting *** minutes, greater than 50% of the time was spent face to face reviewing her case, as well as in preparation of, discussing, and coordinating the patient's care.  The above documentation reflects my direct findings during this shared patient visit. Please see the separate note by Dr. Dewey on this date for the remainder of the patient's plan of care.    Donald KYM Husband, [facility]    **Disclaimer: This note was dictated with voice recognition software. Similar sounding words can inadvertently be transcribed and this note may contain transcription errors which may not have been corrected upon publication of note.**

## 2024-08-03 ENCOUNTER — Ambulatory Visit
Admission: RE | Admit: 2024-08-03 | Discharge: 2024-08-03 | Disposition: A | Discharge status: Home / Self Care | Source: Ambulatory Visit | Attending: Radiation Oncology | Admitting: Radiation Oncology

## 2024-08-03 ENCOUNTER — Encounter (HOSPITAL_BASED_OUTPATIENT_CLINIC_OR_DEPARTMENT_OTHER): Payer: Self-pay | Admitting: Surgery

## 2024-08-03 ENCOUNTER — Encounter: Payer: Self-pay | Admitting: Radiation Oncology

## 2024-08-03 ENCOUNTER — Ambulatory Visit (HOSPITAL_BASED_OUTPATIENT_CLINIC_OR_DEPARTMENT_OTHER)
Admission: RE | Admit: 2024-08-03 | Discharge: 2024-08-03 | Disposition: A | Discharge status: Home / Self Care | Source: Ambulatory Visit | Attending: Hematology | Admitting: Hematology

## 2024-08-03 ENCOUNTER — Other Ambulatory Visit: Payer: Self-pay

## 2024-08-03 VITALS — BP 149/95 | HR 62 | Temp 97.3°F | Resp 18 | Ht 65.5 in | Wt 179.2 lb

## 2024-08-03 DIAGNOSIS — C50411 Malignant neoplasm of upper-outer quadrant of right female breast: Secondary | ICD-10-CM | POA: Insufficient documentation

## 2024-08-03 DIAGNOSIS — I081 Rheumatic disorders of both mitral and tricuspid valves: Secondary | ICD-10-CM | POA: Insufficient documentation

## 2024-08-03 DIAGNOSIS — Z171 Estrogen receptor negative status [ER-]: Secondary | ICD-10-CM | POA: Diagnosis not present

## 2024-08-03 DIAGNOSIS — Z0189 Encounter for other specified special examinations: Secondary | ICD-10-CM | POA: Diagnosis not present

## 2024-08-03 DIAGNOSIS — Z01818 Encounter for other preprocedural examination: Secondary | ICD-10-CM | POA: Insufficient documentation

## 2024-08-03 LAB — ECHOCARDIOGRAM COMPLETE
Area-P 1/2: 3.97 cm2
Calc EF: 63.9 %
Height: 65.5 in
MV VTI: 1.79 cm2
S' Lateral: 2.6 cm
Single Plane A2C EF: 59.2 %
Single Plane A4C EF: 69.9 %
Weight: 2867.74 [oz_av]

## 2024-08-03 NOTE — Progress Notes (Signed)
  Echocardiogram 2D Echocardiogram has been performed.  [name] 08/03/2024, 1:24 PM

## 2024-08-03 NOTE — Progress Notes (Signed)
   08/03/24 1139  PAT Phone Screen  Is the patient taking a GLP-1 receptor agonist? No  Do You Have Diabetes? (S)  Yes (diet controlled DM)  Do You Have Hypertension? Yes  Have You Ever Been to the ER for Asthma? No  Have You Taken Oral Steroids in the Past 3 Months? No  Do you Take Phenteramine or any Other Diet Drugs? No  Recent  Lab Work, EKG, CXR? Yes  Where was this test performed? 07-20-24 CBC/diff, CMP, 01-21-24 EKG  Do you have a history of heart problems? 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 Yes  What cardiac tests were performed? Echo  What date/year were cardiac tests completed? 2016 ECHO EF 60-65%  Results viewable: [facility] Media Tab  Any Recent Hospitalizations? No  Height 5' 5.5 (1.664 m)  Weight 81.3 kg  Pat Appointment Scheduled No  Reason for No Appointment Not Needed

## 2024-08-04 ENCOUNTER — Encounter: Payer: Self-pay | Admitting: *Deleted

## 2024-08-04 ENCOUNTER — Encounter: Payer: Self-pay | Admitting: Rehabilitation

## 2024-08-04 ENCOUNTER — Other Ambulatory Visit: Payer: Self-pay

## 2024-08-04 ENCOUNTER — Ambulatory Visit (INDEPENDENT_AMBULATORY_CARE_PROVIDER_SITE_OTHER): Payer: Medicare Other

## 2024-08-04 ENCOUNTER — Ambulatory Visit: Attending: Surgery | Admitting: Rehabilitation

## 2024-08-04 VITALS — BP 118/84 | HR 64 | Ht 65.0 in | Wt 177.0 lb

## 2024-08-04 DIAGNOSIS — Z171 Estrogen receptor negative status [ER-]: Secondary | ICD-10-CM | POA: Diagnosis present

## 2024-08-04 DIAGNOSIS — R293 Abnormal posture: Secondary | ICD-10-CM | POA: Diagnosis present

## 2024-08-04 DIAGNOSIS — C50411 Malignant neoplasm of upper-outer quadrant of right female breast: Secondary | ICD-10-CM | POA: Diagnosis present

## 2024-08-04 DIAGNOSIS — Z0000 Encounter for general adult medical examination without abnormal findings: Secondary | ICD-10-CM

## 2024-08-04 NOTE — Therapy (Signed)
 OUTPATIENT PHYSICAL THERAPY BREAST CANCER BASELINE EVALUATION   Patient Name: [name] MRN: [id number] DOB:Jun 18, 1951, 73 y.o., female Today's Date: 08/04/2024  END OF SESSION:  PT End of Session - 08/04/24 0901     Visit Number 1    Number of Visits 2    Date for Recertification  08/25/24    Authorization Type auth required    PT Start Time 0901    PT Stop Time 0936    PT Time Calculation (min) 35 min    Activity Tolerance Patient tolerated treatment well    Behavior During Therapy [facility] for tasks assessed/performed          Past Medical History:  Diagnosis Date   Allergy    Anxiety    Asthma    under control   Back pain    Breast cancer (HCC) 07/2024   right breast ductal adenocarcinoma   GERD (gastroesophageal reflux disease)    Hyperlipidemia    Hypertension    Tendinitis of left rotator cuff 07/20/2020   Transient memory loss 12/09/2014   Type 2 diabetes mellitus (HCC)    Past Surgical History:  Procedure Laterality Date   bladder mesh     BREAST BIOPSY Right 07/08/2024   US  RT BREAST BX W LOC DEV 1ST LESION IMG BX SPEC US  GUIDE 07/08/2024 [facility]MO[facility]   COLONOSCOPY N/A 07/12/2015   Procedure: COLONOSCOPY;  Surgeon: Claudis RAYMOND Rivet, MD;  [facility];  Service: Endoscopy;  Laterality: N/A;  1030   COLONOSCOPY WITH PROPOFOL  N/A 11/18/2023   Procedure: COLONOSCOPY WITH PROPOFOL ;  Surgeon: Eartha Angelia Sieving, MD;  Location: [facility];  Service: Gastroenterology;  Laterality: N/A;  8:15AM;ASA 1   NM MYOCAR PERF WALL MOTION  02/15/2007   dipyridamole myoview; EF 70%, no wall motion abnormalities, no inducible ischemia, low risk    OVARIAN CYST REMOVAL Left 09/16/1985   POLYPECTOMY  11/18/2023   Procedure: POLYPECTOMY, INTESTINE;  Surgeon: Eartha Angelia, Sieving, MD;  Location: [facility];  Service: Gastroenterology;;   TONSILECTOMY/ADENOIDECTOMY WITH MYRINGOTOMY  09/16/1956   TONSILLECTOMY     TRANSTHORACIC ECHOCARDIOGRAM   02/15/2007   RV mildly dlated; LV normal in size; mild MR with thickened MV leaflets; mild TR; mild pulm valve regurg   VAGINAL DELIVERY     x3   Patient Active Problem List   Diagnosis Date Noted   Malignant neoplasm of upper-outer quadrant of right breast in female, estrogen receptor negative (HCC) 07/20/2024   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 04/15/2024   History of colonic polyps 11/18/2023   Statin-induced myositis 06/23/2023   Diabetes mellitus (HCC) 04/16/2023   Fatty liver 04/16/2023   Abnormal cervical Papanicolaou smear 04/16/2023   Asthma    Cervical radiculopathy 09/18/2018   DDD (degenerative disc disease), cervical 09/18/2018   Overactive bladder 04/23/2018   Family history of heart disease 11/21/2017   Other fatigue 11/21/2017   Anxiety, generalized 05/14/2016   Gastroesophageal reflux disease without esophagitis 02/13/2016   Obesity (BMI 30-39.9) 02/13/2016   Vitamin D  deficiency 02/13/2016   Unspecified convulsions (HCC) 12/14/2014   Hypertension associated with diabetes (HCC) 12/09/2014   Hyperlipidemia associated with type 2 diabetes mellitus (HCC) 12/09/2014    PCP: Norene Fielding, DO  REFERRING PROVIDER: Debby Shipper, MD  REFERRING DIAG:  C50.411,Z17.1 (ICD-10-CM) - Malignant neoplasm of upper-outer quadrant of right breast in female, estrogen receptor negative (HCC)    THERAPY DIAG:  Malignant neoplasm of upper-outer quadrant of right breast in female, estrogen  receptor negative (HCC)  Abnormal posture  Rationale for Evaluation and Treatment: Rehabilitation  ONSET DATE: July 08, 2024   SUBJECTIVE:                                                                                                                                                                                           SUBJECTIVE STATEMENT: Patient reports she is here today for her newly diagnosed right breast cancer. The patient is surgery on 07/10/24 which will consist of  a lumpectomy with sentinel node biopsy. The patient reports that the current treatment plan is for 6 months of chemo followed by radiation after surgery; she will meet with the oncologist after surgery to confirm this treatment plan. 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   PERTINENT HISTORY:  Patient was diagnosed on July 08, 2024 with right grade 3 invasive ductal carcinoma. It measures 1.8 cm and is located in the upper inner quadrant. It is triple negative with Ki-67 40%  PATIENT GOALS:   reduce lymphedema risk and learn post op HEP.   PAIN:  Are you having pain? No  PRECAUTIONS: Active CA   RED FLAGS: None   HAND DOMINANCE: right  WEIGHT BEARING RESTRICTIONS: No  FALLS:  Has patient fallen in last 6 months? No and Yes. Number of falls once after spraining her ankle  LIVING ENVIRONMENT: Patient lives with: husband Lives in: House/apartment Has following equipment at home: None  OCCUPATION: Retired   LEISURE: gardening, taking care of her 40 houseplants, cooking and sowing  PRIOR LEVEL OF FUNCTION: Independent   OBJECTIVE: Note: Objective measures were completed at Evaluation unless otherwise noted.  COGNITION: Overall cognitive status: Within functional limits for tasks assessed    POSTURE:  Forward head and rounded shoulders posture  UPPER EXTREMITY AROM/PROM:  A/PROM RIGHT   eval   Shoulder extension 48  Shoulder flexion 160  Shoulder abduction 175  Shoulder internal rotation 65  Shoulder external rotation 79    (Blank rows = not tested)  A/PROM LEFT   eval  Shoulder extension 58  Shoulder flexion 156  Shoulder abduction 173  Shoulder internal rotation 58  Shoulder external rotation 83    (Blank rows = not tested)  CERVICAL AROM: All within normal limits: WFL   UPPER EXTREMITY STRENGTH: 5/5 bilaterally  LYMPHEDEMA ASSESSMENTS (in cm):   LANDMARK RIGHT   eval  10 cm proximal to olecranon process from proximal aspect of olecranon  30.7  Olecranon process 26  10 cm proximal to ulnar styloid process from proximal aspect of styloid process 23.7  Just distal to ulnar styloid process 16.8  Across  hand at thumb web space 20  At base of 2nd digit 6.7  (Blank rows = not tested)  LANDMARK LEFT   eval  10 cm proximal to olecranon process from proximal aspect of olecranon 30.6   Olecranon process 26.1  10 cm proximal to ulnar styloid process from proximal aspect of styloid process 22.4  Just distal to ulnar styloid process 17.3  Across hand at thumb web space 20.1  At base of 2nd digit 6.4  (Blank rows = not tested)  L-DEX LYMPHEDEMA SCREENING:  The patient was assessed using the L-Dex machine today to produce a lymphedema index baseline score. The patient will be reassessed on a regular basis (typically every 3 months) to obtain new L-Dex scores. If the score is > 6.5 points away from his/her baseline score indicating onset of subclinical lymphedema, it will be recommended to wear a compression garment for 4 weeks, 12 hours per day and then be reassessed. If the score continues to be > 6.5 points from baseline at reassessment, we will initiate lymphedema treatment. Assessing in this manner has a 95% rate of preventing clinically significant lymphedema.   L-DEX FLOWSHEETS - 08/04/24 0900       L-DEX LYMPHEDEMA SCREENING   Measurement Type Unilateral    L-DEX MEASUREMENT EXTREMITY Upper Extremity    POSITION  Standing    DOMINANT SIDE Right    At Risk Side Right    BASELINE SCORE (UNILATERAL) 0.2          QUICK DASH SURVEY: 13.64%  PATIENT EDUCATION:  Education details: Time spent educating patient on aspects of self-care to maximize post op recovery. Patient was educated on where and how to get a post op compression bra to use to reduce post op edema. Patient was also educated on the use of SOZO screenings and surveillance principles for early identification of lymphedema onset. She was instructed to use the post  op pillow in the axilla for pressure and pain relief. Patient educated on lymphedema risk reduction and post op shoulder/posture HEP. Person educated: Patient Education method: Explanation, Demonstration, Handout Education comprehension: Patient verbalized understanding and returned demonstration  HOME EXERCISE PROGRAM: Patient was instructed today in a home exercise program today for post op shoulder range of motion. These included active assist shoulder flexion in sitting, scapular retraction, wall walking with shoulder abduction, and hands behind head external rotation.  She was encouraged to do these twice a day, holding 3 seconds and repeating 5 times when permitted by her physician.   ASSESSMENT:  CLINICAL IMPRESSION: Patient is a 73 y.o. female who presents with recently diagnosed right invasive ductal carcinoma. She is planning to have a lumpectomy with sentinel node biopsy. The current treatment plan following surgery consists of 6 months of chemotherapy followed by radiation. The patient has no shoulder ROM or strength deficits during this visit but does report some arthritis in her hands and spine. SOZO screening is done during the visit to obtain a baseline measurement. Patient education is provided on lymphedema, the importance of SOZO screening, POC, post-op exercises, and obtaining a compression bra. She will benefit from a post op PT reassessment to determine needs and from L-Dex screens every 3 months for 2 years to detect subclinical lymphedema.  Pt will benefit from skilled therapeutic intervention to improve on the following deficits: Decreased knowledge of precautions, impaired UE functional use, pain, decreased ROM, postural dysfunction.   PT treatment/interventions: ADL/self-care home management, pt/family education, therapeutic exercise  REHAB POTENTIAL: Excellent  CLINICAL  DECISION MAKING: Stable/uncomplicated  EVALUATION COMPLEXITY: Low   GOALS: Goals reviewed with  patient? YES  LONG TERM GOALS: (STG=LTG)    Name Target Date Goal status  1 Pt will be able to verbalize understanding of pertinent lymphedema risk reduction practices relevant to her dx specifically related to skin care.  Baseline:  No knowledge 08/04/2024 Achieved at eval  2 Pt will be able to return demo and/or verbalize understanding of the post op HEP related to regaining shoulder ROM. Baseline:  No knowledge 08/04/2024 Achieved at eval  3 Pt will be able to verbalize understanding of the importance of viewing the post op After Breast CA Class video for further lymphedema risk reduction education and therapeutic exercise.  Baseline:  No knowledge 08/04/2024 Achieved at eval  4 Pt will demo she has regained full shoulder ROM and function post operatively compared to baselines.  Baseline: See objective measurements taken today. 08/25/24     PLAN:  PT FREQUENCY/DURATION: EVAL and 1 follow up appointment.   PLAN FOR NEXT SESSION: will reassess 3-4 weeks post op to determine needs.   Patient will follow up at outpatient cancer rehab 3-4 weeks following surgery.  If the patient requires physical therapy at that time, a specific plan will be dictated and sent to the referring physician for approval. The patient was educated today on appropriate basic range of motion exercises to begin post operatively and the importance of viewing the After Breast Cancer class video following surgery.  Patient was educated today on lymphedema risk reduction practices as it pertains to recommendations that will benefit the patient immediately following surgery.  She verbalized good understanding.    Physical Therapy Information for After Breast Cancer Surgery/Treatment:  Lymphedema is a swelling condition that you may be at risk for in your arm if you have lymph nodes removed from the armpit area.  After a sentinel node biopsy, the risk is approximately 5-9% and is higher after an axillary node dissection.   There is treatment available for this condition and it is not life-threatening.  Contact your physician or physical therapist with concerns. You may begin the 4 shoulder/posture exercises (see additional sheet) when permitted by your physician (typically a week after surgery).  If you have drains, you may need to wait until those are removed before beginning range of motion exercises.  A general recommendation is to not lift your arms above shoulder height until drains are removed.  These exercises should be done to your tolerance and gently.  This is not a no pain/no gain type of recovery so listen to your body and stretch into the range of motion that you can tolerate, stopping if you have pain.  If you are having immediate reconstruction, ask your plastic surgeon about doing exercises as he or she may want you to wait. We encourage you to view the After Breast Cancer class video following surgery.  You will learn information related to lymphedema risk, prevention and treatment and additional exercises to regain mobility following surgery.   While undergoing any medical procedure or treatment, try to avoid blood pressure being taken or needle sticks from occurring on the arm on the side of cancer.   This recommendation begins after surgery and continues for the rest of your life.  This may help reduce your risk of getting lymphedema (swelling in your arm). An excellent resource for those seeking information on lymphedema is the National Lymphedema Network's web site. It can be accessed at www.lymphnet.org If you notice  swelling in your hand, arm or breast at any time following surgery (even if it is many years from now), please contact your doctor or physical therapist to discuss this.  Lymphedema can be treated at any time but it is easier for you if it is treated early on.  If you feel like your shoulder motion is not returning to normal in a reasonable amount of time, please contact your surgeon or  physical therapist.  [facility] [phone]. [address], [address], [place] [address]  [facility] After Breast Cancer Class  After Breast Cancer Class is a specially designed exercise class video to assist you in a safe recover after having breast cancer surgery.  In this video you will learn how to get back to full function whether your drains were just removed or if you had surgery a month ago. The video can be viewed on this page: [email or web address]-meyer.org/ or on YouTube here: [email or web address].  Class Goals  Understand specific stretches to improve the flexibility of you chest and shoulder. Learn ways to safely strengthen your upper body and improve your posture. Understand the warning signs of infection and why you may be at risk for an arm infection. Learn about Lymphedema and prevention.  ** You do not need to view this video until after surgery.  Drains should be removed to participate in the recommended exercises on the video.  Patient was instructed today in a home exercise program today for post op shoulder range of motion. These included active assist shoulder flexion in sitting, scapular retraction, wall walking with shoulder abduction, and hands behind head external rotation.  She was encouraged to do these twice a day, holding 3 seconds and repeating 5 times when permitted by her physician.    Randall Pack, SPT  08/04/2024, 11:15 AM  I agree with the following treatment note after reviewing documentation. This session was performed under the supervision of a licensed clinician.  Saddie Raw, PT 08/04/24, 11:16 AM

## 2024-08-04 NOTE — Progress Notes (Signed)
 Chief Complaint  Patient presents with   Medicare Wellness     Subjective:   [name] is a 73 y.o. female who presents for a Medicare Annual Wellness Visit.  Allergies (verified) Bee venom, Crestor [rosuvastatin], Erythromycin, Latex, Rosuvastatin calcium, Peanut-containing drug products, Pravastatin , Zetia  [ezetimibe ], Aleve [naproxen sodium], Tetracyclines & related, and Vytorin [ezetimibe -simvastatin]   History: Past Medical History:  Diagnosis Date   Allergy    Anxiety    Asthma    under control   Back pain    Breast cancer (HCC) 07/2024   right breast ductal adenocarcinoma   GERD (gastroesophageal reflux disease)    Hyperlipidemia    Hypertension    Tendinitis of left rotator cuff 07/20/2020   Transient memory loss 12/09/2014   Type 2 diabetes mellitus (HCC)    Past Surgical History:  Procedure Laterality Date   bladder mesh     BREAST BIOPSY Right 07/08/2024   US  RT BREAST BX W LOC DEV 1ST LESION IMG BX SPEC US  GUIDE 07/08/2024 [facility]   COLONOSCOPY N/A 07/12/2015   Procedure: COLONOSCOPY;  Surgeon: Claudis RAYMOND Rivet, MD;  [facility];  Service: Endoscopy;  Laterality: N/A;  1030   COLONOSCOPY WITH PROPOFOL  N/A 11/18/2023   Procedure: COLONOSCOPY WITH PROPOFOL ;  Surgeon: Eartha Angelia Sieving, MD;  Location: [facility];  Service: Gastroenterology;  Laterality: N/A;  8:15AM;ASA 1   NM MYOCAR PERF WALL MOTION  02/15/2007   dipyridamole myoview; EF 70%, no wall motion abnormalities, no inducible ischemia, low risk    OVARIAN CYST REMOVAL Left 09/16/1985   POLYPECTOMY  11/18/2023   Procedure: POLYPECTOMY, INTESTINE;  Surgeon: Eartha Angelia, Sieving, MD;  Location: [facility];  Service: Gastroenterology;;   TONSILECTOMY/ADENOIDECTOMY WITH MYRINGOTOMY  09/16/1956   TONSILLECTOMY     TRANSTHORACIC ECHOCARDIOGRAM  02/15/2007   RV mildly dlated; LV normal in size; mild MR with thickened MV leaflets; mild TR; mild pulm valve regurg    VAGINAL DELIVERY     x3   Family History  Problem Relation Age of Onset   Heart disease Mother    Kidney disease Mother    Breast cancer Mother    Coronary artery disease Mother        CABG at age 68   Dementia Father    Stroke Father    Leukemia Brother        Dx 2012 in remission   Stroke Maternal Grandmother    Cancer Maternal Grandfather        unknown type   Cancer Paternal Grandmother        GYN cancer   Kidney disease Paternal Grandfather    Prostate cancer Maternal Uncle    Liver cancer Paternal Cousin    Prostate cancer Paternal Cousin        mets to liver   Liver cancer Paternal Cousin    Social History   Occupational History   Not on file  Tobacco Use   Smoking status: Never   Smokeless tobacco: Never  Vaping Use   Vaping status: Never Used  Substance and Sexual Activity   Alcohol use: No   Drug use: No   Sexual activity: Not Currently    Birth control/protection: Post-menopausal   Tobacco Counseling Counseling given: Yes  SDOH Screenings   Food Insecurity: No Food Insecurity (08/04/2024)  Housing: Unknown (08/04/2024)  Transportation Needs: No Transportation Needs (08/04/2024)  Utilities: Not At Risk (08/04/2024)  Alcohol Screen: Low Risk  (08/04/2023)  Depression (PHQ2-9):  Low Risk  (08/04/2024)  Financial Resource Strain: Low Risk  (08/04/2023)  Physical Activity: Insufficiently Active (08/04/2024)  Social Connections: Socially Integrated (08/04/2024)  Stress: No Stress Concern Present (08/04/2024)  Tobacco Use: Low Risk  (08/04/2024)  Health Literacy: Patient Unable To Answer (08/04/2024)   See flowsheets for full screening details  Depression Screen PHQ 2 & 9 Depression Scale- Over the past 2 weeks, how often have you been bothered by any of the following problems? Little interest or pleasure in doing things: 0 Feeling down, depressed, or hopeless (PHQ Adolescent also includes...irritable): 0 PHQ-2 Total Score: 0 Trouble falling or  staying asleep, or sleeping too much: 1 Feeling tired or having little energy: 0 Poor appetite or overeating (PHQ Adolescent also includes...weight loss): 0 Feeling bad about yourself - or that you are a failure or have let yourself or your family down: 0 Trouble concentrating on things, such as reading the newspaper or watching television (PHQ Adolescent also includes...like school work): 0 Moving or speaking so slowly that other people could have noticed. Or the opposite - being so fidgety or restless that you have been moving around a lot more than usual: 0 Thoughts that you would be better off dead, or of hurting yourself in some way: 0 PHQ-9 Total Score: 1 If you checked off any problems, how difficult have these problems made it for you to do your work, take care of things at home, or get along with other people?: Not difficult at all  Depression Treatment Depression Interventions/Treatment : EYV7-0 Score <4 Follow-up Not Indicated     Goals Addressed             This Visit's Progress    Remain active and independent   On track      Visit info / Clinical Intake: Medicare Wellness Visit Type:: Subsequent Annual Wellness Visit Persons participating in visit:: patient Medicare Wellness Visit Mode:: Telephone If telephone:: video declined Because this visit was a virtual/telehealth visit:: vitals recorded from last visit If Telephone or Video please confirm:: I connected with the patient using audio enabled telemedicine application and verified that I am speaking with the correct person using two identifiers Patient Location:: home Provider Location:: home office Information given by:: patient Interpreter Needed?: No Pre-visit prep was completed: yes AWV questionnaire completed by patient prior to visit?: no Living arrangements:: lives with spouse/significant other Patient's Overall Health Status Rating: very good Typical amount of pain: none Does pain affect daily life?:  no Are you currently prescribed opioids?: no  Dietary Habits and Nutritional Risks How many meals a day?: 3 Eats fruit and vegetables daily?: yes Most meals are obtained by: preparing own meals In the last 2 weeks, have you had any of the following?: none Diabetic:: no  Functional Status Activities of Daily Living (to include ambulation/medication): Independent Ambulation: Independent Medication Administration: Independent Home Management: Independent Manage your own finances?: yes Primary transportation is: driving Concerns about hearing?: no  Fall Screening Falls in the past year?: 1 Number of falls in past year: 1 Was there an injury with Fall?: 1 Fall Risk Category Calculator: 3 Patient Fall Risk Level: High Fall Risk  Fall Risk Patient at Risk for Falls Due to: Impaired balance/gait; Impaired mobility Fall risk Follow up: Falls evaluation completed; Education provided  Home and Transportation Safety: All rugs have non-skid backing?: yes All stairs or steps have railings?: yes Grab bars in the bathtub or shower?: yes Have non-skid surface in bathtub or shower?: (!) no Good home  lighting?: yes Regular seat belt use?: yes Hospital stays in the last year:: no  Cognitive Assessment Difficulty concentrating, remembering, or making decisions? : yes Will 6CIT or Mini Cog be Completed: yes What year is it?: 0 points What month is it?: 0 points Give patient an address phrase to remember (5 components): [address] [place] About what time is it?: 0 points Count backwards from 20 to 1: 0 points Say the months of the year in reverse: 0 points Repeat the address phrase from earlier: 0 points 6 CIT Score: 0 points  Advance Directives (For Healthcare) Does Patient Have a Medical Advance Directive?: No Would patient like information on creating a medical advance directive?: No - Patient declined  Reviewed/Updated  Reviewed/Updated: Reviewed All (Medical,  Surgical, Family, Medications, Allergies, Care Teams, Patient Goals); Medical History; Surgical History; Family History; Medications; Allergies; Care Teams; Patient Goals        Objective:    Today's Vitals   08/04/24 1401  BP: 118/84  Pulse: 64  Weight: 177 lb (80.3 kg)  Height: 5' 5 (1.651 m)   Body mass index is 29.45 kg/m.  Current Medications (verified) Outpatient Encounter Medications as of 08/04/2024  Medication Sig   albuterol  (VENTOLIN  HFA) 108 (90 Base) MCG/ACT inhaler Inhale 2 puffs into the lungs every 6 (six) hours as needed for wheezing or shortness of breath.   allopurinol  (ZYLOPRIM ) 300 MG tablet TAKE 1 TABLET BY MOUTH EVERY  EVENING   Ascorbic Acid (VITAMIN C PO) Take by mouth.   atenolol  (TENORMIN ) 50 MG tablet TAKE 1 TABLET BY MOUTH DAILY   azelastine  (ASTELIN ) 0.1 % nasal spray Place 1 spray into both nostrils 2 (two) times daily.   BIOTIN PO Take by mouth.   Blood Glucose Monitoring Suppl DEVI Check BGs daily. E11.65. May substitute to any manufacturer covered by patient's insurance.   budesonide (PULMICORT) 180 MCG/ACT inhaler Inhale 2 puffs into the lungs 2 (two) times daily.   cetirizine (ZYRTEC) 10 MG tablet Take 1 tablet by mouth daily.   cholecalciferol (VITAMIN D3) 25 MCG (1000 UT) tablet Take 1,000 Units by mouth daily.   Coenzyme Q10 (COQ-10) 200 MG CAPS Take by mouth daily.   EPINEPHrine  (EPIPEN  2-PAK) 0.3 mg/0.3 mL IJ SOAJ injection Inject 0.3 mg into the muscle as needed for anaphylaxis (then go to ER).   esomeprazole  (NEXIUM ) 20 MG capsule Take 1 capsule (20 mg total) by mouth daily at 12 noon. For GERD   Glucose Blood (BLOOD GLUCOSE TEST STRIPS) STRP Check BGs daily. E11.65. May substitute to any manufacturer covered by patient's insurance.   ketoconazole  (NIZORAL ) 2 % cream Apply 1 Application topically daily. X7-14 days to rash   Lancet Device MISC Check BGs daily. E11.65. May substitute to any manufacturer covered by patient's insurance.    montelukast  (SINGULAIR ) 10 MG tablet TAKE 1 TABLET BY MOUTH IN THE  MORNING   Multiple Vitamin (MULTIVITAMIN) capsule Take 1 capsule by mouth daily.    rOPINIRole (REQUIP) 0.25 MG tablet Take 0.25 mg by mouth as needed.   sertraline  (ZOLOFT ) 25 MG tablet Take 25 mg by mouth daily.   triamcinolone  ointment (KENALOG ) 0.1 % Apply 1 Application topically 2 (two) times daily.   Bempedoic Acid  (NEXLETOL ) 180 MG TABS Take 1 tablet (180 mg total) by mouth daily. (Patient not taking: Reported on 08/04/2024)   Evolocumab  (REPATHA  SURECLICK) 140 MG/ML SOAJ Inject 140 mg into the skin every 14 (fourteen) days. (Patient not taking: Reported on 08/04/2024)  Lancets Misc. MISC Check BGs daily. E11.65. May substitute to any manufacturer covered by patient's insurance. (Patient not taking: Reported on 08/04/2024)   No facility-administered encounter medications on file as of 08/04/2024.   Hearing/Vision screen Hearing Screening - Comments:: Pt denies hearing dif Vision Screening - Comments:: Pt wear glasses/pt Dr. Octavia, last ov 2mos Immunizations and Health Maintenance Health Maintenance  Topic Date Due   COVID-19 Vaccine (3 - Moderna risk series) 01/07/2020   Mammogram  06/06/2023   FOOT EXAM  06/22/2024   Influenza Vaccine  12/14/2024 (Originally 04/16/2024)   HEMOGLOBIN A1C  11/03/2024   OPHTHALMOLOGY EXAM  03/30/2025   Diabetic kidney evaluation - Urine ACR  05/03/2025   Bone Density Scan  06/08/2025   Diabetic kidney evaluation - eGFR measurement  07/20/2025   Medicare Annual Wellness (AWV)  08/04/2025   DTaP/Tdap/Td (2 - Td or Tdap) 02/07/2030   Colonoscopy  11/18/2030   Pneumococcal Vaccine: 50+ Years  Completed   Hepatitis C Screening  Completed   Zoster Vaccines- Shingrix   Completed   Meningococcal B Vaccine  Aged Out        Assessment/Plan:  This is a routine wellness examination for Dashley.  Patient Care Team: Jolinda Norene [name], DO as PCP - General (Family Medicine) Mona  Vinie BROCKS, MD as PCP - Cardiology (Cardiology) Leva Rush, MD as Consulting Physician (Obstetrics and Gynecology) [facility] Gerome Devere [name], RN as Oncology Nurse Navigator  I have personally reviewed and noted the following in the patient's chart:   Medical and social history Use of alcohol, tobacco or illicit drugs  Current medications and supplements including opioid prescriptions. Functional ability and status Nutritional status Physical activity Advanced directives List of other physicians Hospitalizations, surgeries, and ER visits in previous 12 months Vitals Screenings to include cognitive, depression, and falls Referrals and appointments  No orders of the defined types were placed in this encounter.  In addition, I have reviewed and discussed with patient certain preventive protocols, quality metrics, and best practice recommendations. A written personalized care plan for preventive services as well as general preventive health recommendations were provided to patient.   Ozie Ned, CMA   08/04/2024   Return in 1 year (on 08/04/2025).  After Visit Summary: (MyChart) Due to this being a telephonic visit, the after visit summary with patients personalized plan was offered to patient via MyChart   Nurse notes: pt does not want covid vaccine, mammogram done , foot exam is due

## 2024-08-09 ENCOUNTER — Encounter (HOSPITAL_BASED_OUTPATIENT_CLINIC_OR_DEPARTMENT_OTHER): Payer: Self-pay | Admitting: Surgery

## 2024-08-09 NOTE — Anesthesia Preprocedure Evaluation (Signed)
 Anesthesia Evaluation  Patient identified by MRN, date of birth, ID band Patient awake    Reviewed: Allergy & Precautions, NPO status , Patient's Chart, lab work & pertinent test results, reviewed documented beta blocker date and time   Airway Mallampati: II  TM Distance: >3 FB     Dental no notable dental hx. (+) Teeth Intact, Dental Advisory Given, Caps   Pulmonary asthma    Pulmonary exam normal breath sounds clear to auscultation       Cardiovascular hypertension, Pt. on medications and Pt. on home beta blockers Normal cardiovascular exam Rhythm:Regular Rate:Normal  EKG 01/21/24 NSR, Normal  Echo 08/03/24 1. Left ventricular ejection fraction, by estimation, is 60 to 65%. Left  ventricular ejection fraction by 2D MOD biplane is 63.9 %. The left  ventricle has normal function. The left ventricle has no regional wall  motion abnormalities. Left ventricular  diastolic parameters are indeterminate. Elevated left atrial pressure. The  E/e' is 19.5. The average left ventricular global longitudinal strain is  -17.6 %. The global longitudinal strain is abnormal.   2. Right ventricular systolic function is normal. The right ventricular  size is normal. There is normal pulmonary artery systolic pressure. The  estimated right ventricular systolic pressure is 31.9 mmHg.   3. The mitral valve is degenerative. Mild mitral valve regurgitation. No  evidence of mitral stenosis.   4. The aortic valve is tricuspid. Aortic valve regurgitation is not  visualized. Aortic valve sclerosis is present, with no evidence of aortic  valve stenosis.   5. The inferior vena cava is normal in size with greater than 50%  respiratory variability, suggesting right atrial pressure of 3 mmHg.      Neuro/Psych Seizures -, Well Controlled,  Hx/o cervical radiculopathy Restless legs  Neuromuscular disease    GI/Hepatic Neg liver ROS,GERD  Medicated,,   Endo/Other  diabetes, Well Controlled, Type 2  Right breast Ca HLD Gout  Renal/GU negative Renal ROSLab Results      Component                Value               Date                      NA                       139                 07/20/2024                CL                       105                 07/20/2024                K                        4.6                 07/20/2024                CO2                      27  07/20/2024                BUN                      16                  07/20/2024                CREATININE               0.96                07/20/2024                GFRNONAA                 >60                 07/20/2024                CALCIUM                  9.9                 07/20/2024                PHOS                     3.9                 05/03/2024                ALBUMIN                   4.8                 07/20/2024                GLUCOSE                  109 (H)             07/20/2024            Bladder dysfunction  OAB    Musculoskeletal DDD cervical spine   Abdominal   Peds  Hematology Lab Results      Component                Value               Date                      WBC                      7.7                 07/20/2024                HGB                      13.7                07/20/2024                HCT                      38.9                07/20/2024  MCV                      83.8                07/20/2024                PLT                      195                 07/20/2024              Anesthesia Other Findings   Reproductive/Obstetrics                              Anesthesia Physical Anesthesia Plan  ASA: 3  Anesthesia Plan: General   Post-op Pain Management: Dilaudid  IV, Precedex and Tylenol  PO (pre-op)*   Induction: Intravenous  PONV Risk Score and Plan: 4 or greater and Treatment may vary due to age or medical condition, Ondansetron ,  Dexamethasone  and Propofol  infusion  Airway Management Planned: LMA  Additional Equipment: None  Intra-op Plan:   Post-operative Plan: Extubation in OR  Informed Consent: I have reviewed the patients History and Physical, chart, labs and discussed the procedure including the risks, benefits and alternatives for the proposed anesthesia with the patient or authorized representative who has indicated his/her understanding and acceptance.     Dental advisory given  Plan Discussed with: Anesthesiologist and CRNA  Anesthesia Plan Comments:          Anesthesia Quick Evaluation

## 2024-08-10 ENCOUNTER — Ambulatory Visit (HOSPITAL_BASED_OUTPATIENT_CLINIC_OR_DEPARTMENT_OTHER)
Admission: RE | Admit: 2024-08-10 | Discharge: 2024-08-10 | Disposition: A | Discharge status: Home / Self Care | Attending: Surgery | Admitting: Surgery

## 2024-08-10 ENCOUNTER — Ambulatory Visit (HOSPITAL_BASED_OUTPATIENT_CLINIC_OR_DEPARTMENT_OTHER): Admitting: Anesthesiology

## 2024-08-10 ENCOUNTER — Encounter

## 2024-08-10 ENCOUNTER — Encounter (HOSPITAL_BASED_OUTPATIENT_CLINIC_OR_DEPARTMENT_OTHER): Payer: Self-pay | Admitting: Surgery

## 2024-08-10 ENCOUNTER — Other Ambulatory Visit: Payer: Self-pay

## 2024-08-10 ENCOUNTER — Ambulatory Visit (HOSPITAL_COMMUNITY)

## 2024-08-10 ENCOUNTER — Inpatient Hospital Stay
Admission: RE | Admit: 2024-08-10 | Discharge: 2024-08-10 | Disposition: A | Source: Ambulatory Visit | Attending: Surgery | Admitting: Surgery

## 2024-08-10 ENCOUNTER — Ambulatory Visit
Admission: RE | Admit: 2024-08-10 | Discharge: 2024-08-10 | Disposition: A | Source: Ambulatory Visit | Attending: Surgery | Admitting: Surgery

## 2024-08-10 ENCOUNTER — Encounter (HOSPITAL_BASED_OUTPATIENT_CLINIC_OR_DEPARTMENT_OTHER)
Admission: RE | Disposition: A | Discharge status: Home / Self Care | Payer: Self-pay | Source: Home / Self Care | Attending: Surgery

## 2024-08-10 ENCOUNTER — Other Ambulatory Visit: Payer: Self-pay | Admitting: Surgery

## 2024-08-10 ENCOUNTER — Encounter (HOSPITAL_BASED_OUTPATIENT_CLINIC_OR_DEPARTMENT_OTHER): Admitting: Anesthesiology

## 2024-08-10 ENCOUNTER — Ambulatory Visit
Admission: RE | Admit: 2024-08-10 | Discharge: 2024-08-10 | Disposition: A | Discharge status: Home / Self Care | Source: Ambulatory Visit | Attending: Surgery | Admitting: Surgery

## 2024-08-10 DIAGNOSIS — C50911 Malignant neoplasm of unspecified site of right female breast: Secondary | ICD-10-CM

## 2024-08-10 DIAGNOSIS — Z17421 Hormone receptor negative with human epidermal growth factor receptor 2 negative status: Secondary | ICD-10-CM | POA: Insufficient documentation

## 2024-08-10 DIAGNOSIS — C50211 Malignant neoplasm of upper-inner quadrant of right female breast: Secondary | ICD-10-CM | POA: Insufficient documentation

## 2024-08-10 DIAGNOSIS — Z1722 Progesterone receptor negative status: Secondary | ICD-10-CM | POA: Insufficient documentation

## 2024-08-10 DIAGNOSIS — I10 Essential (primary) hypertension: Secondary | ICD-10-CM | POA: Diagnosis not present

## 2024-08-10 DIAGNOSIS — Z171 Estrogen receptor negative status [ER-]: Secondary | ICD-10-CM | POA: Diagnosis not present

## 2024-08-10 DIAGNOSIS — J45909 Unspecified asthma, uncomplicated: Secondary | ICD-10-CM | POA: Diagnosis not present

## 2024-08-10 DIAGNOSIS — E119 Type 2 diabetes mellitus without complications: Secondary | ICD-10-CM | POA: Diagnosis not present

## 2024-08-10 DIAGNOSIS — Z01818 Encounter for other preprocedural examination: Secondary | ICD-10-CM

## 2024-08-10 HISTORY — PX: BREAST LUMPECTOMY WITH RADIOACTIVE SEED AND SENTINEL LYMPH NODE BIOPSY: SHX6550

## 2024-08-10 HISTORY — PX: PORTACATH PLACEMENT: SHX2246

## 2024-08-10 HISTORY — DX: Anxiety disorder, unspecified: F41.9

## 2024-08-10 HISTORY — PX: BREAST BIOPSY: SHX20

## 2024-08-10 SURGERY — BREAST LUMPECTOMY WITH RADIOACTIVE SEED AND SENTINEL LYMPH NODE BIOPSY
Anesthesia: General | Site: Chest | Laterality: Right

## 2024-08-10 MED ORDER — PROPOFOL 500 MG/50ML IV EMUL
INTRAVENOUS | Status: DC | PRN
  Administered 2024-08-10: 75 ug/kg/min via INTRAVENOUS
  Administered 2024-08-10: 60 ug/kg/min via INTRAVENOUS

## 2024-08-10 MED ORDER — OXYCODONE HCL 5 MG PO TABS
ORAL_TABLET | ORAL | Status: AC
  Filled 2024-08-10: qty 1

## 2024-08-10 MED ORDER — OXYCODONE HCL 5 MG PO TABS
5.0000 mg | ORAL_TABLET | Freq: Once | ORAL | Status: AC | PRN
  Administered 2024-08-10: 5 mg via ORAL

## 2024-08-10 MED ORDER — PROPOFOL 10 MG/ML IV BOLUS
INTRAVENOUS | Status: DC | PRN
  Administered 2024-08-10: 20 ug via INTRAVENOUS
  Administered 2024-08-10: 140 ug via INTRAVENOUS

## 2024-08-10 MED ORDER — HEPARIN (PORCINE) IN NACL 2-0.9 UNITS/ML
INTRAMUSCULAR | Status: AC | PRN
  Administered 2024-08-10: 1 via INTRAVENOUS

## 2024-08-10 MED ORDER — HYDROMORPHONE HCL 1 MG/ML IJ SOLN
0.2500 mg | INTRAMUSCULAR | Status: DC | PRN
  Administered 2024-08-10 (×2): 0.5 mg via INTRAVENOUS

## 2024-08-10 MED ORDER — CEFAZOLIN SODIUM-DEXTROSE 2-4 GM/100ML-% IV SOLN: 2.0000 g | INTRAVENOUS | Status: DC

## 2024-08-10 MED ORDER — HYDROMORPHONE HCL 1 MG/ML IJ SOLN
INTRAMUSCULAR | Status: AC
  Filled 2024-08-10: qty 0.5

## 2024-08-10 MED ORDER — PROPOFOL 500 MG/50ML IV EMUL
INTRAVENOUS | Status: AC
Start: 2024-08-10 — End: 2024-08-10
  Filled 2024-08-10: qty 100

## 2024-08-10 MED ORDER — OXYCODONE HCL 5 MG PO TABS: 5.0000 mg | ORAL_TABLET | Freq: Four times a day (QID) | ORAL | 0 refills | Status: AC | PRN

## 2024-08-10 MED ORDER — MAGTRACE LYMPHATIC TRACER
INTRAMUSCULAR | Status: DC | PRN
  Administered 2024-08-10: 2 mL via INTRAMUSCULAR

## 2024-08-10 MED ORDER — HEPARIN SOD (PORK) LOCK FLUSH 100 UNIT/ML IV SOLN
INTRAVENOUS | Status: DC | PRN
  Administered 2024-08-10: 500 [IU] via INTRAVENOUS

## 2024-08-10 MED ORDER — BUPIVACAINE LIPOSOME 1.3 % IJ SUSP
INTRAMUSCULAR | Status: DC | PRN
  Administered 2024-08-10: 10 mL via PERINEURAL

## 2024-08-10 MED ORDER — CHLORHEXIDINE GLUCONATE CLOTH 2 % EX PADS: 6.0000 | MEDICATED_PAD | Freq: Once | CUTANEOUS | Status: DC

## 2024-08-10 MED ORDER — OXYCODONE HCL 5 MG/5ML PO SOLN: 5.0000 mg | Freq: Once | ORAL | Status: AC | PRN

## 2024-08-10 MED ORDER — DEXAMETHASONE SOD PHOSPHATE PF 10 MG/ML IJ SOLN
INTRAMUSCULAR | Status: DC | PRN
  Administered 2024-08-10: 5 mg via INTRAVENOUS

## 2024-08-10 MED ORDER — MIDAZOLAM HCL (PF) 2 MG/2ML IJ SOLN
2.0000 mg | Freq: Once | INTRAMUSCULAR | Status: AC
  Administered 2024-08-10: 1 mg via INTRAVENOUS

## 2024-08-10 MED ORDER — CEFAZOLIN SODIUM-DEXTROSE 2-4 GM/100ML-% IV SOLN
INTRAVENOUS | Status: AC
  Filled 2024-08-10: qty 100

## 2024-08-10 MED ORDER — ONDANSETRON HCL 4 MG/2ML IJ SOLN
INTRAMUSCULAR | Status: DC | PRN
  Administered 2024-08-10: 4 mg via INTRAVENOUS

## 2024-08-10 MED ORDER — DROPERIDOL 2.5 MG/ML IJ SOLN
0.6250 mg | Freq: Once | INTRAMUSCULAR | Status: DC | PRN
Start: 2024-08-10 — End: 2024-08-10

## 2024-08-10 MED ORDER — PHENYLEPHRINE 80 MCG/ML (10ML) SYRINGE FOR IV PUSH (FOR BLOOD PRESSURE SUPPORT)
PREFILLED_SYRINGE | INTRAVENOUS | Status: DC | PRN
  Administered 2024-08-10: 160 ug via INTRAVENOUS

## 2024-08-10 MED ORDER — FENTANYL CITRATE (PF) 100 MCG/2ML IJ SOLN
INTRAMUSCULAR | Status: DC | PRN
Start: 2024-08-10 — End: 2024-08-10
  Administered 2024-08-10 (×2): 50 ug via INTRAVENOUS

## 2024-08-10 MED ORDER — BUPIVACAINE-EPINEPHRINE 0.25% -1:200000 IJ SOLN
INTRAMUSCULAR | Status: DC | PRN
Start: 2024-08-10 — End: 2024-08-10
  Administered 2024-08-10: 30 mL

## 2024-08-10 MED ORDER — ALBUMIN HUMAN 5 % IV SOLN: INTRAVENOUS | Status: DC | PRN

## 2024-08-10 MED ORDER — FENTANYL CITRATE (PF) 100 MCG/2ML IJ SOLN
100.0000 ug | Freq: Once | INTRAMUSCULAR | Status: AC
  Administered 2024-08-10: 50 ug via INTRAVENOUS

## 2024-08-10 MED ORDER — EPHEDRINE SULFATE-NACL 50-0.9 MG/10ML-% IV SOSY
PREFILLED_SYRINGE | INTRAVENOUS | Status: DC | PRN
  Administered 2024-08-10 (×3): 5 mg via INTRAVENOUS

## 2024-08-10 MED ORDER — BUPIVACAINE HCL (PF) 0.5 % IJ SOLN
INTRAMUSCULAR | Status: DC | PRN
  Administered 2024-08-10: 15 mL via PERINEURAL

## 2024-08-10 MED ORDER — MIDAZOLAM HCL 2 MG/2ML IJ SOLN
INTRAMUSCULAR | Status: AC
  Filled 2024-08-10: qty 2

## 2024-08-10 MED ORDER — FENTANYL CITRATE (PF) 100 MCG/2ML IJ SOLN
INTRAMUSCULAR | Status: AC
  Filled 2024-08-10: qty 2

## 2024-08-10 MED ORDER — LACTATED RINGERS IV SOLN: INTRAVENOUS | Status: DC

## 2024-08-10 MED ORDER — CEFAZOLIN SODIUM-DEXTROSE 2-3 GM-%(50ML) IV SOLR
INTRAVENOUS | Status: DC | PRN
  Administered 2024-08-10: 2 g via INTRAVENOUS

## 2024-08-10 MED ORDER — ONDANSETRON HCL 4 MG/2ML IJ SOLN: 4.0000 mg | Freq: Once | INTRAMUSCULAR | Status: DC | PRN

## 2024-08-10 MED ORDER — EPHEDRINE 5 MG/ML INJ
INTRAVENOUS | Status: AC
Start: 2024-08-10 — End: 2024-08-10
  Filled 2024-08-10: qty 5

## 2024-08-10 MED ORDER — LIDOCAINE 2% (20 MG/ML) 5 ML SYRINGE
INTRAMUSCULAR | Status: DC | PRN
  Administered 2024-08-10: 60 mg via INTRAVENOUS

## 2024-08-10 SURGICAL SUPPLY — 61 items
BAG DECANTER FOR FLEXI CONT (MISCELLANEOUS) ×2 IMPLANT
BENZOIN TINCTURE PRP APPL 2/3 (GAUZE/BANDAGES/DRESSINGS) IMPLANT
BINDER BREAST LRG (GAUZE/BANDAGES/DRESSINGS) IMPLANT
BINDER BREAST MEDIUM (GAUZE/BANDAGES/DRESSINGS) IMPLANT
BINDER BREAST XLRG (GAUZE/BANDAGES/DRESSINGS) IMPLANT
BINDER BREAST XXLRG (GAUZE/BANDAGES/DRESSINGS) IMPLANT
BLADE HEX COATED 2.75 (ELECTRODE) ×2 IMPLANT
BLADE SURG 11 STRL SS (BLADE) ×2 IMPLANT
BLADE SURG 15 STRL LF DISP TIS (BLADE) ×2 IMPLANT
CANISTER SUC SOCK COL 7IN (MISCELLANEOUS) IMPLANT
CANISTER SUCT 1200ML W/VALVE (MISCELLANEOUS) ×2 IMPLANT
CHLORAPREP W/TINT 26 (MISCELLANEOUS) ×2 IMPLANT
CLIP APPLIE 9.375 MED OPEN (MISCELLANEOUS) ×2 IMPLANT
COVER BACK TABLE 60X90IN (DRAPES) ×2 IMPLANT
COVER MAYO STAND STRL (DRAPES) ×2 IMPLANT
COVER PROBE CYLINDRICAL 5X96 (MISCELLANEOUS) ×2 IMPLANT
DERMABOND ADVANCED .7 DNX12 (GAUZE/BANDAGES/DRESSINGS) ×2 IMPLANT
DRAPE C-ARM 42X72 X-RAY (DRAPES) ×2 IMPLANT
DRAPE LAPAROSCOPIC ABDOMINAL (DRAPES) ×2 IMPLANT
DRAPE UTILITY XL STRL (DRAPES) ×2 IMPLANT
DRSG TEGADERM 2-3/8X2-3/4 SM (GAUZE/BANDAGES/DRESSINGS) IMPLANT
DRSG TEGADERM 4X4.75 (GAUZE/BANDAGES/DRESSINGS) IMPLANT
ELECT COATED BLADE 2.86 ST (ELECTRODE) ×2 IMPLANT
ELECTRODE REM PT RTRN 9FT ADLT (ELECTROSURGICAL) ×2 IMPLANT
GAUZE 4X4 16PLY ~~LOC~~+RFID DBL (SPONGE) ×2 IMPLANT
GAUZE SPONGE 4X4 12PLY STRL LF (GAUZE/BANDAGES/DRESSINGS) IMPLANT
GLOVE BIOGEL PI IND STRL 8 (GLOVE) ×2 IMPLANT
GLOVE ECLIPSE 8.0 STRL XLNG CF (GLOVE) ×2 IMPLANT
GOWN STRL REUS W/ TWL LRG LVL3 (GOWN DISPOSABLE) ×4 IMPLANT
GOWN STRL REUS W/ TWL XL LVL3 (GOWN DISPOSABLE) ×2 IMPLANT
HEMOSTAT ARISTA ABSORB 3G PWDR (HEMOSTASIS) IMPLANT
HEMOSTAT SNOW SURGICEL 2X4 (HEMOSTASIS) IMPLANT
KIT CVR 48X5XPRB PLUP LF (MISCELLANEOUS) ×2 IMPLANT
KIT MARKER MARGIN INK (KITS) ×2 IMPLANT
KIT PORT POWER 8FR ISP CVUE (Port) IMPLANT
NDL HYPO 22X1.5 SAFETY MO (MISCELLANEOUS) IMPLANT
NDL HYPO 25X1 1.5 SAFETY (NEEDLE) ×2 IMPLANT
NDL SAFETY ECLIPSE 18X1.5 (NEEDLE) IMPLANT
NDL SPNL 22GX3.5 QUINCKE BK (NEEDLE) IMPLANT
PACK BASIN DAY SURGERY FS (CUSTOM PROCEDURE TRAY) ×2 IMPLANT
PENCIL SMOKE EVACUATOR (MISCELLANEOUS) ×2 IMPLANT
SHEATH COOK PEEL AWAY SET 9F (SHEATH) IMPLANT
SLEEVE SCD COMPRESS KNEE MED (STOCKING) ×2 IMPLANT
SOLN 0.9% NACL POUR BTL 1000ML (IV SOLUTION) ×2 IMPLANT
SPIKE FLUID TRANSFER (MISCELLANEOUS) IMPLANT
SPONGE T-LAP 4X18 ~~LOC~~+RFID (SPONGE) ×2 IMPLANT
STRIP CLOSURE SKIN 1/2X4 (GAUZE/BANDAGES/DRESSINGS) IMPLANT
SUT MNCRL AB 4-0 PS2 18 (SUTURE) ×2 IMPLANT
SUT MON AB 4-0 PC3 18 (SUTURE) ×2 IMPLANT
SUT PROLENE 2 0 CT2 30 (SUTURE) IMPLANT
SUT PROLENE 2 0 SH DA (SUTURE) ×2 IMPLANT
SUT SILK 2 0 TIES 17X18 (SUTURE) IMPLANT
SUT VICRYL 3-0 CR8 SH (SUTURE) ×2 IMPLANT
SYR 5ML LUER SLIP (SYRINGE) ×2 IMPLANT
SYR BULB EAR ULCER 3OZ GRN STR (SYRINGE) IMPLANT
SYR CONTROL 10ML LL (SYRINGE) ×2 IMPLANT
TOWEL GREEN STERILE FF (TOWEL DISPOSABLE) ×4 IMPLANT
TRACER MAGTRACE VIAL (MISCELLANEOUS) IMPLANT
TRAY FAXITRON CT DISP (TRAY / TRAY PROCEDURE) ×2 IMPLANT
TUBE CONNECTING 20X1/4 (TUBING) ×2 IMPLANT
YANKAUER SUCT BULB TIP NO VENT (SUCTIONS) ×2 IMPLANT

## 2024-08-10 NOTE — Discharge Instructions (Addendum)
 [facility] [employer] Office Phone Number [phone]  BREAST BIOPSY/ PARTIAL MASTECTOMY: POST OP INSTRUCTIONS  Always review your discharge instruction sheet given to you by the facility where your surgery was performed.  IF YOU HAVE DISABILITY OR FAMILY LEAVE FORMS, YOU MUST BRING THEM TO THE OFFICE FOR PROCESSING.  DO NOT GIVE THEM TO YOUR DOCTOR.  A prescription for pain medication may be given to you upon discharge.  Take your pain medication as prescribed, if needed.  If narcotic pain medicine is not needed, then you may take acetaminophen  (Tylenol ) or ibuprofen (Advil) as needed. Take your usually prescribed medications unless otherwise directed If you need a refill on your pain medication, please contact your pharmacy.  They will contact our office to request authorization.  Prescriptions will not be filled after 5pm or on week-ends. You should eat very light the first 24 hours after surgery, such as soup, crackers, pudding, etc.  Resume your normal diet the day after surgery. Most patients will experience some swelling and bruising in the breast.  Ice packs and a good support bra will help.  Swelling and bruising can take several days to resolve.  It is common to experience some constipation if taking pain medication after surgery.  Increasing fluid intake and taking a stool softener will usually help or prevent this problem from occurring.  A mild laxative (Milk of Magnesia or Miralax) should be taken according to package directions if there are no bowel movements after 48 hours. Unless discharge instructions indicate otherwise, you may remove your bandages 24-48 hours after surgery, and you may shower at that time.  You may have steri-strips (small skin tapes) in place directly over the incision.  These strips should be left on the skin for 7-10 days.  If your surgeon used skin glue on the incision, you may shower in 24 hours.  The glue will flake off over the next 2-3 weeks.  Any  sutures or staples will be removed at the office during your follow-up visit. ACTIVITIES:  You may resume regular daily activities (gradually increasing) beginning the next day.  Wearing a good support bra or sports bra minimizes pain and swelling.  You may have sexual intercourse when it is comfortable. You may drive when you no longer are taking prescription pain medication, you can comfortably wear a seatbelt, and you can safely maneuver your car and apply brakes. RETURN TO WORK:  ______________________________________________________________________________________ [name] should see your doctor in the office for a follow-up appointment approximately two weeks after your surgery.  Your doctor's nurse will typically make your follow-up appointment when she calls you with your pathology report.  Expect your pathology report 2-3 business days after your surgery.  You may call to check if you do not hear from us  after three days. OTHER INSTRUCTIONS: _______________________________________________________________________________________________ _____________________________________________________________________________________________________________________________________ _____________________________________________________________________________________________________________________________________ _____________________________________________________________________________________________________________________________________  WHEN TO CALL YOUR DOCTOR: Fever over 101.0 Nausea and/or vomiting. Extreme swelling or bruising. Continued bleeding from incision. Increased pain, redness, or drainage from the incision.  The clinic staff is available to answer your questions during regular business hours.  Please don't hesitate to call and ask to speak to one of the nurses for clinical concerns.  If you have a medical emergency, go to the nearest emergency room or call 911.  A surgeon from [facility] is always on call at the hospital.  For further questions, please visit [email or web address]     Post Anesthesia Home Care Instructions  Activity: Get plenty of rest for the remainder  of the day. A responsible individual must stay with you for 24 hours following the procedure.  For the next 24 hours, DO NOT: -Drive a car -Advertising copywriter -Drink alcoholic beverages -Take any medication unless instructed by your physician -Make any legal decisions or sign important papers.  Meals: Start with liquid foods such as gelatin or soup. Progress to regular foods as tolerated. Avoid greasy, spicy, heavy foods. If nausea and/or vomiting occur, drink only clear liquids until the nausea and/or vomiting subsides. Call your physician if vomiting continues.  Special Instructions/Symptoms: Your throat may feel dry or sore from the anesthesia or the breathing tube placed in your throat during surgery. If this causes discomfort, gargle with warm salt water . The discomfort should disappear within 24 hours.  If you had a scopolamine patch placed behind your ear for the management of post- operative nausea and/or vomiting:  1. The medication in the patch is effective for 72 hours, after which it should be removed.  Wrap patch in a tissue and discard in the trash. Wash hands thoroughly with soap and water . 2. You may remove the patch earlier than 72 hours if you experience unpleasant side effects which may include dry mouth, dizziness or visual disturbances. 3. Avoid touching the patch. Wash your hands with soap and water  after contact with the patch.   Information for Discharge Teaching: EXPAREL  (bupivacaine  liposome injectable suspension)   Pain relief is important to your recovery. The goal is to control your pain so you can move easier and return to your normal activities as soon as possible after your procedure. Your physician may use several types of medicines to manage pain,  swelling, and more.  Your surgeon or anesthesiologist gave you EXPAREL (bupivacaine ) to help control your pain after surgery.  EXPAREL  is a local anesthetic designed to release slowly over an extended period of time to provide pain relief by numbing the tissue around the surgical site. EXPAREL  is designed to release pain medication over time and can control pain for up to 72 hours. Depending on how you respond to EXPAREL , you may require less pain medication during your recovery. EXPAREL  can help reduce or eliminate the need for opioids during the first few days after surgery when pain relief is needed the most. EXPAREL  is not an opioid and is not addictive. It does not cause sleepiness or sedation.   Important! A teal colored band has been placed on your arm with the date, time and amount of EXPAREL  you have received. Please leave this armband in place for the full 96 hours following administration, and then you may remove the band. If you return to the hospital for any reason within 96 hours following the administration of EXPAREL , the armband provides important information that your health care providers to know, and alerts them that you have received this anesthetic.    Possible side effects of EXPAREL : Temporary loss of sensation or ability to move in the area where medication was injected. Nausea, vomiting, constipation Rarely, numbness and tingling in your mouth or lips, lightheadedness, or anxiety may occur. Call your doctor right away if you think you may be experiencing any of these sensations, or if you have other questions regarding possible side effects.  Follow all other discharge instructions given to you by your surgeon or nurse. Eat a healthy diet and drink plenty of water  or other fluids.

## 2024-08-10 NOTE — Progress Notes (Signed)
 Assisted Dr. Malen Gauze with right, pectoralis, ultrasound guided block. Side rails up, monitors on throughout procedure. See vital signs in flow sheet. Tolerated Procedure well.

## 2024-08-10 NOTE — Anesthesia Postprocedure Evaluation (Signed)
 Anesthesia Post Note  Patient: [name]  Procedure(s) Performed: BREAST LUMPECTOMY WITH RADIOACTIVE SEED AND SENTINEL LYMPH NODE BIOPSY (Right: Breast) INSERTION, TUNNELED CENTRAL VENOUS DEVICE, WITH PORT (Chest)     Patient location during evaluation: PACU Anesthesia Type: General Level of consciousness: awake and alert and oriented Pain management: pain level controlled Vital Signs Assessment: post-procedure vital signs reviewed and stable Respiratory status: spontaneous breathing, nonlabored ventilation and respiratory function stable Cardiovascular status: blood pressure returned to baseline and stable Postop Assessment: no apparent nausea or vomiting Anesthetic complications: no   No notable events documented.  Last Vitals:  Vitals:   08/10/24 1500 08/10/24 1523  BP: (!) 163/93 (!) 164/97  Pulse: 68 71  Resp: 10 14  Temp:  (!) 36.3 C  SpO2: 94% 95%    Last Pain:  Vitals:   08/10/24 1523  TempSrc: Temporal  PainSc: 4                  Heaton Sarin A.

## 2024-08-10 NOTE — Transfer of Care (Signed)
 Immediate Anesthesia Transfer of Care Note  Patient: [name]  Procedure(s) Performed: BREAST LUMPECTOMY WITH RADIOACTIVE SEED AND SENTINEL LYMPH NODE BIOPSY (Right: Breast) INSERTION, TUNNELED CENTRAL VENOUS DEVICE, WITH PORT (Chest)  Patient Location: PACU  Anesthesia Type:General  Level of Consciousness: drowsy and patient cooperative  Airway & Oxygen Therapy: Patient Spontanous Breathing and Patient connected to face mask oxygen  Post-op Assessment: Report given to RN and Post -op Vital signs reviewed and stable  Post vital signs: Reviewed and stable  Last Vitals:  Vitals Value Taken Time  BP 136/83 08/10/24 14:22  Temp 36.4 C 08/10/24 14:22  Pulse 70 08/10/24 14:29  Resp 12 08/10/24 14:29  SpO2 98 % 08/10/24 14:29  Vitals shown include unfiled device data.  Last Pain:  Vitals:   08/10/24 1422  TempSrc:   PainSc: Asleep      Patients Stated Pain Goal: 6 (08/10/24 1050)  Complications: No notable events documented.

## 2024-08-10 NOTE — H&P (Signed)
 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 Receptors are pending but it was a grade 3 poorly differentiated adenocarcinoma consistent with breast primary. Location right breast upper inner quadrant. Patient denies mass, pain or discharge.  Review of Systems: A complete review of systems was obtained from the patient. I have reviewed this information and discussed as appropriate with the patient. See HPI as well for other ROS.    Medical History: Past Medical History:  Diagnosis Date  Arthritis  Asthma, unspecified asthma severity, unspecified whether complicated, unspecified whether persistent (HHS-HCC)  Diabetes mellitus without complication (CMS/HHS-HCC)  GERD (gastroesophageal reflux disease)  Hypertension   There is no problem list on file for this patient.  History reviewed. No pertinent surgical history.   Allergies  Allergen Reactions  Tetracyclines Other (See Comments) and Rash  unknown  Substance with tetracycline structure (substance)  tetracycline hydrochloride  Erythromycin Itching, Other (See Comments) and Rash  Ezetimibe -Simvastatin Other (See Comments)  Latex Itching, Other (See Comments), Rash and Unknown  rash  Naproxen Other (See Comments)  unknown, unknown  unknown unknown  unknown unknown, unknown Gastritis naproxen  unknown unknown  Gastritis  unknown  unknown, unknown  Gastritis  naproxen  naproxen  Peanut Other (See Comments)  Neck breaks out and gets really red  peanut allergenic extract  Pravastatin  Other (See Comments)  Pain in  joints.  pravastatin    No current outpatient medications on file prior to visit.   No current facility-administered medications on file prior to visit.   Family History  Problem Relation Age of Onset  High blood pressure (Hypertension) Mother  Hyperlipidemia (Elevated cholesterol) Mother  Breast cancer Mother  High blood pressure (Hypertension) Father    Social History   Tobacco Use  Smoking Status Never  Smokeless Tobacco Never    Social History   Socioeconomic History  Marital status: Married  Tobacco Use  Smoking status: Never  Smokeless tobacco: Never  Vaping Use  Vaping status: Unknown  Substance and Sexual Activity  Alcohol use: Never  Drug use: Never   Social Drivers of Corporate Investment Banker Strain: Low Risk (08/04/2023)  Received from [facility]  Overall Financial Resource Strain (CARDIA)  Difficulty of Paying Living Expenses: Not hard at all  Food Insecurity: Low Risk (05/05/2024)  Received from [facility]  Hunger Vital Sign  Within the past 12 months, you worried that your food would run out before you got money to buy more: Never true  Within the past 12 months, the food you bought just didn't last and you didn't have money to get more. : Never true  Transportation Needs: No Transportation Needs (05/05/2024)  Received from [employer] [facility]  In the past 12 months, has lack of reliable transportation kept you from medical appointments, meetings, work or from getting things needed for daily living? : No  Physical Activity: Insufficiently Active (08/04/2023)  Received from [facility]  Exercise Vital Sign  On average, how many days per week do you engage in moderate to strenuous exercise (like a brisk walk)?: 3 days  On average, how many minutes do you engage in exercise at this level?: 30 min  Stress: No Stress Concern Present (08/04/2023)  Received from [facility] - Occupational Stress  Questionnaire  Feeling of Stress : Not at all  Social Connections: Socially Integrated (08/04/2023)  [facility]  Social Connection and Isolation Panel  In a typical week, how many times do you talk on the phone with family, friends, or neighbors?: More than three times a week  How often do you get together with friends or relatives?: Three times a week  How often do you attend church or religious services?: More than 4 times per year  Do you belong to any clubs or organizations such as church groups, unions, fraternal or athletic groups, or school groups?: Yes  How often do you attend meetings of the clubs or organizations you belong to?: More than 4 times per year  Are you married, widowed, divorced, separated, never married, or living with a partner?: Married  Housing Stability: Unknown (07/12/2024)  Housing Stability Vital Sign  Homeless in the Last Year: No   Objective:   Vitals:  07/12/24 1445  BP: (!) 148/90  Pulse: 71  Resp: 16  Temp: 36.5 C (97.7 F)  SpO2: 98%  Weight: 80.7 kg (178 lb)  Height: 166.4 cm (5' 5.5)  PainSc: 5   Body mass index is 29.17 kg/m.  Physical Exam Exam conducted with a chaperone present.  Cardiovascular:  Rate and Rhythm: Normal rate.  Pulmonary:  Effort: Pulmonary effort is normal.  Chest:  Breasts: Left: Normal.   Comments: Bruising noted. Musculoskeletal:  General: Normal range of motion.  Cervical back: Normal range of motion.  Lymphadenopathy:  Upper Body:  Right upper body: No axillary adenopathy.  Left upper body: No axillary adenopathy.  Skin: General: Skin is warm.  Neurological:  General: No focal deficit present.  Mental Status: She is alert.  Psychiatric:  Mood and Affect: Mood normal.     Labs, Imaging and Diagnostic Testing:  LINICAL DATA: 73 year old presenting as a recall from screening for right breast mass.  EXAM: ULTRASOUND OF THE RIGHT BREAST  COMPARISON: Previous  exams.  FINDINGS: On physical exam, in the upper inner right breast I feel a discrete mass.  Targeted ultrasound is performed at 1 o'clock 8 cm from the nipple in the right breast demonstrating an irregular hypoechoic mass measuring 1.8 x 1.7 x 1.5 cm. This corresponds to the mammographic finding. Targeted ultrasound of the right axilla demonstrates normal lymph nodes.  IMPRESSION: Suspicious mass in the right breast at 1 o'clock measuring 1.8 cm.  RECOMMENDATION: Ultrasound-guided core needle biopsy x1 of the right breast.  I have discussed the findings and recommendations with the patient. If applicable, a reminder letter will be sent to the patient regarding the next appointment.  BI-RADS CATEGORY 4: Suspicious.   Electronically Signed By: Inocente Ast M.D. On: 07/06/2024 10:29  FINAL DIAGNOSIS  1. 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 Case is reviewed by Dr. Macie who concurs with the interpretation. Diagnosis called to Rock Hover at [facility] by Dr. Reed on  07/09/2024 at 9:08 AM.  DATE SIGNED OUT: 07/09/2024 ELECTRONIC SIGNATURE : Picklesimer Md, Prentice , Sports Administrator, International Aid/development Worker  MICROSCOPIC DESCRIPTION  CASE COMMENTS STAINS USED IN DIAGNOSIS: H&E-2 H&E-3 H&E-4 H&E Her2 by IHC ER-ACIS KI-67-ACIS PR-ACIS *RECUT 1 SLIDE  CLINICAL HISTORY  SPECIMEN(S) OBTAINED 1. Breast, right, needle core biopsy, 1:00 (heart  Clip)  SPECIMEN COMMENTS: 1. TIF: 1:30pm, CIT: < 1 min SPECIMEN CLINICAL INFORMATION: 1. Highly suspicious solid mass expect invasive malignancy  Gross Description 1. Received in formalin labeled Bracknell,Lelani and right breast 1 o'clock, 8 cmfn (TIF 1330, CIT less than 1 min) are four cores of pink white to yellow soft to firm tissue which average 1.5 x 0.2 x 0.2 cm. One block. (SW:gt, 07/09/24)  Report signed out from the following location(s) [facility] [address] [place], [place] [address] CLIA #: [id number]  [facility] [address] [place], [place] [address] CLIA #: [id number]   Assessment and Plan:   Diagnoses and all orders for this visit:  Breast cancer, stage 1, right (CMS/HHS-HCC) - Ambulatory Referral to Oncology-Medical - Ambulatory Referral to Radiation Oncology   Awaiting receptor status.  Discussed about breast conserving surgery and mastectomy with or without reconstruction. Discussed the roles of chemotherapy and radiation therapy which are yet unknown until receptors returned. I do think by size she is a good breast conserving candidate talked about lumpectomy. I do recommend sentinel lymph node mapping in the circumstance due to poorly differentiated state but will await receptor status to make a final determination if that is necessary or not.The procedure has been discussed with the patient. Alternatives to surgery have been discussed with the patient. Risks of surgery include bleeding, Infection, Seroma formation, death, and the need for further surgery. The patient understands and wishes to proceed.    DEBBY CURTISTINE SHIPPER, MD

## 2024-08-10 NOTE — Anesthesia Procedure Notes (Signed)
 Anesthesia Regional Block: Pectoralis block   Pre-Anesthetic Checklist: , timeout performed,  Correct Patient, Correct Site, Correct Laterality,  Correct Procedure, Correct Position, site marked,  Risks and benefits discussed,  Surgical consent,  Pre-op evaluation,  At surgeon's request and post-op pain management  Laterality: Right  Prep: chloraprep       Needles:  Injection technique: Single-shot  Needle Type: Echogenic Stimulator Needle     Needle Length: 10cm  Needle Gauge: 21   Needle insertion depth: 7 cm   Additional Needles:   Procedures:,,,, ultrasound used (permanent image in chart),,    Narrative:  Start time: 08/10/2024 11:19 AM End time: 08/10/2024 11:24 AM Injection made incrementally with aspirations every 5 mL.  Performed by: Personally  Anesthesiologist: Jerrye Sharper, MD  Additional Notes: Timeout performed. Patient sedated. Relevant anatomy ID'd using US . Incremental 2-5ml injection of LA with frequent aspiration. Patient tolerated procedure well.

## 2024-08-10 NOTE — Interval H&P Note (Signed)
 History and Physical Interval Note:  08/10/2024 11:14 AM  [name]  has presented today for surgery, with the diagnosis of RIGHT BREAST CANCER.  The various methods of treatment have been discussed with the patient and family. After consideration of risks, benefits and other options for treatment, the patient has consented to  Procedure(s) with comments: BREAST LUMPECTOMY WITH RADIOACTIVE SEED AND SENTINEL LYMPH NODE BIOPSY (Right) INSERTION, TUNNELED CENTRAL VENOUS DEVICE, WITH PORT (N/A) - PORT PLACEMENT WITH ULTRASOUND GUIDANCE as a surgical intervention.  The patient's history has been reviewed, patient examined, no change in status, stable for surgery.  I have reviewed the patient's chart and labs.  Questions were answered to the patient's satisfaction.   The procedure has been discussed with the patient. Alternatives to surgery have been discussed with the patient.  Risks of surgery include bleeding,  Infection,  Seroma formation, death,  and the need for further surgery.   The patient understands and wishes to proceed. Sentinel lymph node mapping and dissection has been discussed with the patient.  Risk of bleeding,  Infection,  Seroma formation,  Additional procedures,,  Shoulder weakness ,  Shoulder stiffness,  Nerve and blood vessel injury and reaction to the mapping dyes have been discussed.  Alternatives to surgery have been discussed with the patient.  The patient agrees to proceed.   Sylvan Sookdeo A Chaska Hagger

## 2024-08-10 NOTE — Anesthesia Procedure Notes (Signed)
 Procedure Name: LMA Insertion Date/Time: 08/10/2024 12:21 PM  Performed by: Leotha Andrez DEL, CRNAPre-anesthesia Checklist: Patient identified, Emergency Drugs available, Suction available, Patient being monitored and Timeout performed Patient Re-evaluated:Patient Re-evaluated prior to induction Oxygen Delivery Method: Circle system utilized Preoxygenation: Pre-oxygenation with 100% oxygen Induction Type: IV induction Ventilation: Mask ventilation without difficulty LMA: LMA inserted LMA Size: 4.0 Number of attempts: 1 Placement Confirmation: breath sounds checked- equal and bilateral and positive ETCO2 Tube secured with: Tape Dental Injury: Teeth and Oropharynx as per pre-operative assessment

## 2024-08-10 NOTE — Op Note (Signed)
 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  She was seen by medical oncology in the multidisciplinary clinic and opted for breast conserving surgery followed by chemotherapy after reviewing her options of neoadjuvant versus postoperative chemotherapy.  A port was also requested.The procedure has been discussed with the patient. Alternatives to surgery have been discussed with the patient.  Risks of surgery include bleeding,  Infection,  Seroma formation, death,  and the need for further surgery.   The patient understands and wishes to proceed. Sentinel lymph node mapping and dissection has been discussed with the patient.  Risk of bleeding,  Infection,  Seroma formation,  Additional procedures,,  Shoulder weakness ,  Shoulder stiffness,  Nerve and blood vessel injury and reaction to the mapping dyes have been discussed.  Alternatives to surgery have been discussed with the patient.  Risk of port placement include bleeding, infection, pneumothorax, hemothorax, injury to mediastinal structures, bleeding, blood clots, catheter migration, and the need for revisional surgery.  The patient agrees to proceed.    Description of procedure: The patient was met in the holding area and questions were answered.  A right pectoral block was administered by anesthesia and the right side was marked as  correct.  Films were available for review.  A seed was placed as an outpatient.  She was taken back to the operative room placed upon the OR table.  After induction of general anesthesia, the right upper neck and right upper chest region were prepped and draped in sterile fashion and timeout performed. The right internal jugular vein  was entered under U/S guidance  and the guidewire threaded into the superior vena cava right atrial area under fluoroscopic guidance. An incision was then made on the anterior chest wall and a subcutaneous pocket fashioned for the port reservoir.  The port tubing was then brought through a subcutaneous tunnel from the port site to the guidewire site.  The port and catheter were attached, locked  and flushed. The catheter was measured and cut to appropriate length.The dilator and peel-away sheath were then advanced over the guidewire while monitoring this with fluoroscopy. The guidewire and dilator were removed and the tubing threaded to approximately 21 cm. The peel-away sheath was then removed. The catheter aspirated and flushed easily. Using fluoroscopy the tip was in the superior vena cava right atrial junction area. It aspirated and flushed easily. That aspirated and flushed easily.  The reservoir was secured to the fascia with 1 sutures of 2-0 Prolene. A final check with fluoroscopy was done to make sure we had no kinks and good positioning of the tip of the catheter. Everything appeared to be okay. The catheter was aspirated, flushed with dilute heparin  and then concentrated aqueous heparin .  The incision was then closed with interrupted 3-0 Vicryl, and 4-0 Monocryl subcuticular with Dermabond on the skin. C-arm images obtained in the operating room showed the tip to be in the distal SVC.  It functioned well.  No complicating feature of pneumo or hemothorax or other complicating issue noted on C-arm images of the right chest.  There is no kinking.     The catheter is  ready for use   There were no operative complications. Estimated blood loss was minimal. All counts were correct.     The patient was then reprepped and draped in a sterile fashion.  A second timeout was performed.  2 cc of mag tracer injected in the right upper breast.  This was massaged for 5 minutes.  The neoprobe was used to identify the mass in the right upper inner quadrant.  This was palpable.  A transverse incision was made over this.  Dissection was carried down and all tissue around the mass were excised with grossly negative margins.  This was oriented with ink and imaging revealed the seed clip to be present.  This was sent to pathology.  The cavity was irrigated and made hemostatic with cautery.  Local anesthetic infiltrated throughout the cavity.  Clips were used to mark the cavity.  The deep tissue planes were approximated with 3-0 Vicryl.  4-0 Monocryl was used to close the skin in a subcuticular fashion.  The symptomatic probe was used to identify increased area of uptake in the right axilla.  A 4 cm incision was made along the inferior hairline on the right axilla.  Dissection was carried down to the level 1 contents.  A node had a spike in it from the mag trace and this was removed which was a level 1 node.  There is no other significant adenopathy.  Level 2 nodes felt normal.  I could not feel open the level 3 but I did not feel any obvious abnormality.  The long thoracic nerve, thoracodorsal trunk and extra vein were preserved.  Hemostasis achieved with cautery.  There were no other spikes of activity with a Sentimag probe.  Deep tissue planes were approximated with 3-0 Vicryl.  4-0 Monocryl was used to close the skin in a subcuticular fashion.  Dermabond was applied.  All counts found to be correct.  The patient was then awoke extubated and taken to recovery in satisfactory condition.  Breast binder was in place.

## 2024-08-11 ENCOUNTER — Encounter (HOSPITAL_BASED_OUTPATIENT_CLINIC_OR_DEPARTMENT_OTHER): Payer: Self-pay | Admitting: Surgery

## 2024-08-13 LAB — SURGICAL PATHOLOGY

## 2024-08-16 ENCOUNTER — Ambulatory Visit: Payer: Self-pay | Admitting: Surgery

## 2024-08-18 ENCOUNTER — Encounter: Payer: Self-pay | Admitting: *Deleted

## 2024-08-20 ENCOUNTER — Other Ambulatory Visit: Payer: Self-pay

## 2024-08-20 DIAGNOSIS — C50411 Malignant neoplasm of upper-outer quadrant of right female breast: Secondary | ICD-10-CM

## 2024-08-23 ENCOUNTER — Inpatient Hospital Stay: Admitting: Hematology

## 2024-08-23 ENCOUNTER — Inpatient Hospital Stay: Attending: Hematology

## 2024-08-23 VITALS — BP 134/86 | HR 69 | Temp 97.6°F | Resp 17 | Wt 181.1 lb

## 2024-08-23 DIAGNOSIS — Z171 Estrogen receptor negative status [ER-]: Secondary | ICD-10-CM | POA: Insufficient documentation

## 2024-08-23 DIAGNOSIS — Z5111 Encounter for antineoplastic chemotherapy: Secondary | ICD-10-CM | POA: Insufficient documentation

## 2024-08-23 DIAGNOSIS — K219 Gastro-esophageal reflux disease without esophagitis: Secondary | ICD-10-CM | POA: Insufficient documentation

## 2024-08-23 DIAGNOSIS — Z1732 Human epidermal growth factor receptor 2 negative status: Secondary | ICD-10-CM | POA: Insufficient documentation

## 2024-08-23 DIAGNOSIS — I10 Essential (primary) hypertension: Secondary | ICD-10-CM | POA: Insufficient documentation

## 2024-08-23 DIAGNOSIS — E785 Hyperlipidemia, unspecified: Secondary | ICD-10-CM | POA: Diagnosis not present

## 2024-08-23 DIAGNOSIS — Z7951 Long term (current) use of inhaled steroids: Secondary | ICD-10-CM | POA: Insufficient documentation

## 2024-08-23 DIAGNOSIS — C50411 Malignant neoplasm of upper-outer quadrant of right female breast: Secondary | ICD-10-CM | POA: Diagnosis present

## 2024-08-23 DIAGNOSIS — J45909 Unspecified asthma, uncomplicated: Secondary | ICD-10-CM | POA: Diagnosis not present

## 2024-08-23 DIAGNOSIS — Z1722 Progesterone receptor negative status: Secondary | ICD-10-CM | POA: Diagnosis not present

## 2024-08-23 DIAGNOSIS — Z79899 Other long term (current) drug therapy: Secondary | ICD-10-CM | POA: Insufficient documentation

## 2024-08-23 DIAGNOSIS — E1165 Type 2 diabetes mellitus with hyperglycemia: Secondary | ICD-10-CM | POA: Diagnosis not present

## 2024-08-23 LAB — CBC WITH DIFFERENTIAL (CANCER CENTER ONLY)
Abs Immature Granulocytes: 0.03 K/uL (ref 0.00–0.07)
Basophils Absolute: 0.1 K/uL (ref 0.0–0.1)
Basophils Relative: 1 %
Eosinophils Absolute: 0.3 K/uL (ref 0.0–0.5)
Eosinophils Relative: 3 %
HCT: 39.4 % (ref 36.0–46.0)
Hemoglobin: 13.5 g/dL (ref 12.0–15.0)
Immature Granulocytes: 0 %
Lymphocytes Relative: 28 %
Lymphs Abs: 2.5 K/uL (ref 0.7–4.0)
MCH: 29 pg (ref 26.0–34.0)
MCHC: 34.3 g/dL (ref 30.0–36.0)
MCV: 84.5 fL (ref 80.0–100.0)
Monocytes Absolute: 0.7 K/uL (ref 0.1–1.0)
Monocytes Relative: 7 %
Neutro Abs: 5.5 K/uL (ref 1.7–7.7)
Neutrophils Relative %: 61 %
Platelet Count: 186 K/uL (ref 150–400)
RBC: 4.66 MIL/uL (ref 3.87–5.11)
RDW: 13.5 % (ref 11.5–15.5)
WBC Count: 8.9 K/uL (ref 4.0–10.5)
nRBC: 0 % (ref 0.0–0.2)

## 2024-08-23 LAB — CMP (CANCER CENTER ONLY)
ALT: 51 U/L — ABNORMAL HIGH (ref 0–44)
AST: 46 U/L — ABNORMAL HIGH (ref 15–41)
Albumin: 4.5 g/dL (ref 3.5–5.0)
Alkaline Phosphatase: 88 U/L (ref 38–126)
Anion gap: 10 (ref 5–15)
BUN: 13 mg/dL (ref 8–23)
CO2: 27 mmol/L (ref 22–32)
Calcium: 10 mg/dL (ref 8.9–10.3)
Chloride: 102 mmol/L (ref 98–111)
Creatinine: 0.89 mg/dL (ref 0.44–1.00)
GFR, Estimated: >60 mL/min (ref >60)
Glucose, Bld: 137 mg/dL — ABNORMAL HIGH (ref 70–99)
Potassium: 4.4 mmol/L (ref 3.5–5.1)
Sodium: 138 mmol/L (ref 135–145)
Total Bilirubin: 0.5 mg/dL (ref 0.0–1.2)
Total Protein: 7 g/dL (ref 6.5–8.1)

## 2024-08-23 NOTE — Assessment & Plan Note (Signed)
 pT2N0M0, stage IIA, triple negative - Diagnosed in October 2025, discovered on routine screening mammogram.  The tumor was 1.8 cm in the upper inner quadrant of right breast, triple negative with Ki-67 40%. -She underwent lumpectomy and sentinel lymph node biopsy, which showed 2.5 cm invasive ductal carcinoma with negative margins, 1 node was negative. -I recommend adjuvant chemotherapy TC every 3 weeks for 4 cycles.

## 2024-08-23 NOTE — Progress Notes (Signed)
 [facility]   [phone] [phone]   Clinic Follow up Note   Patient Care Team: Jolinda Norene [name], DO as PCP - General (Family Medicine) Mona Vinie BROCKS, MD as PCP - Cardiology (Cardiology) Leva Rush, MD as Consulting Physician (Obstetrics and Gynecology) [facility] Gerome Devere [name], RN as Oncology Nurse Navigator  Date of Service:  08/23/2024  CHIEF COMPLAINT: f/u of right breast cancer  CURRENT THERAPY:  Pending adjuvant chemotherapy  Oncology History   Malignant neoplasm of upper-outer quadrant of right breast in female, estrogen receptor negative (HCC) pT2N0M0, stage IIA, triple negative - Diagnosed in October 2025, discovered on routine screening mammogram.  The tumor was 1.8 cm in the upper inner quadrant of right breast, triple negative with Ki-67 40%. -She underwent lumpectomy and sentinel lymph node biopsy, which showed 2.5 cm invasive ductal carcinoma with negative margins, 1 node was negative. -I recommend adjuvant chemotherapy TC every 3 weeks for 4 cycles.  Assessment & Plan Triple negative right breast cancer, stage 2A, post-surgery Recently resected triple negative breast cancer, stage 2A (tumor size 2.5 cm, one lymph node removed and negative). Disease is aggressive but node-negative. She is in the post-surgical period and has not yet initiated adjuvant chemotherapy.  - I recommend adjuvant chemotherapy.  Discussed adjuvant chemotherapy options. ACT regimen (adriamycin, cyclophosphamide, followed by weekly paclitaxel for 5 months) versus TC regimen (docetaxel and cyclophosphamide every 3 weeks for 4 cycles, total 3 months); provided written information on both regimens. -Given her age, overall health, tumor size, and node-negative status, TC regimen is considered appropriate. AC-T regimen is more intensive and associated with higher risk of long-term toxicities, including cardiotoxicity and secondary  MDS/leukemia, particularly in older adults. TC regimen is shorter, generally better tolerated, and most side effects are short-term and reversible. She is aware of the option for scalp cooling to reduce alopecia but is likely not interested. - Discussed DigniCap scalp cooling to reduce alopecia; she is likely not interested. - Advised to call with her decision regarding chemotherapy regimen and DigniCap. - Planned to initiate chemotherapy approximately one month post-surgery, targeting the week of December 29th, pending her decision and insurance approval. - Scheduled chemotherapy education class with oncology nursing staff prior to first treatment. - Reviewed anticipated side effects and cycle timing for chemotherapy. - Prescribed topical cream for port site and two antiemetic medications for home use prior to chemotherapy initiation.  Postoperative state following breast cancer surgery Two weeks post breast cancer surgery with two incisions (breast and axilla). She reports mild pain managed with intermittent acetaminophen  and no ongoing use of opioids. Incisions are healing well on examination, without infection or dehiscence. She is scheduled for postoperative follow-up with her surgeon in two weeks. - Examined surgical incisions and port site; confirmed appropriate healing. - Reinforced plan to follow up with her surgeon in two weeks for postoperative assessment.   Plan - I reviewed her surgical pathology results - I recommend adjuvant chemotherapy TC every 3 weeks for 4 cycles, also discussed option of AC-T, she wants to think about it and let me know soon - She had a port placed during her breast surgery - Plan to start chemotherapy the week of December 29.  Will schedule chemo class before that. - Follow-up on first cycle of chemo.  SUMMARY OF ONCOLOGIC HISTORY: Oncology History  Malignant neoplasm of upper-outer quadrant of right breast in female, estrogen receptor negative (HCC)   07/08/2024 Cancer Staging   Staging form: Breast,  AJCC 8th Edition - Clinical stage from 07/08/2024: Stage IB (cT1c, cN0, cM0, G3, ER-, PR-, HER2-) - Signed by Lanny Callander, MD on 07/20/2024 Stage prefix: Initial diagnosis Histologic grading system: 3 grade system   07/20/2024 Initial Diagnosis   Malignant neoplasm of upper-outer quadrant of right breast in female, estrogen receptor negative (HCC)   08/10/2024 Cancer Staging   Staging form: Breast, AJCC 8th Edition - Pathologic stage from 08/10/2024: Stage IIA (pT2, pN0, cM0, G3, ER-, PR-, HER2-) - Signed by Lanny Callander, MD on 08/22/2024 Stage prefix: Initial diagnosis Histologic grading system: 3 grade system Residual tumor (R): R0      Discussed the use of AI scribe software for clinical note transcription with the patient, who gave verbal consent to proceed.  History of Present Illness [name] is a 73 year old female with recently resected stage 2A triple negative breast cancer who presents for post-operative oncology follow-up and discussion of adjuvant chemotherapy options.  She is two weeks post-breast surgery with pathology showing a 2.5 cm triple negative tumor and one negative lymph node. She has not yet had her post-operative surgical follow-up, which is scheduled in two weeks.  She has axillary and breast incisions and a port site. The axillary incision causes mild activity-related pain, worse after removing her bra, controlled with intermittent acetaminophen . She has not used narcotics since postoperative day three. She notes increased somnolence since surgery and denies fevers, chills, or other systemic symptoms.  Adjuvant chemotherapy options were reviewed, including ACT (adriamycin, cyclophosphamide, followed by weekly paclitaxel) versus TC (docetaxel and cyclophosphamide). Risks and benefits were discussed, including higher long-term cardiac toxicity and rare secondary leukemia with ACT and the shorter duration and  improved tolerability with TC. She is concerned about chemotherapy-induced alopecia given baseline hair thinning and is likely not interested in scalp cooling.     All other systems were reviewed with the patient and are negative.  MEDICAL HISTORY:  Past Medical History:  Diagnosis Date   Allergy    Anxiety    Asthma    under control   Back pain    Breast cancer (HCC) 07/2024   right breast ductal adenocarcinoma   GERD (gastroesophageal reflux disease)    Hyperlipidemia    Hypertension    Tendinitis of left rotator cuff 07/20/2020   Transient memory loss 12/09/2014   Type 2 diabetes mellitus (HCC)     SURGICAL HISTORY: Past Surgical History:  Procedure Laterality Date   bladder mesh     BREAST BIOPSY Right 07/08/2024   US  RT BREAST BX W LOC DEV 1ST LESION IMG BX SPEC US  GUIDE 07/08/2024 [facility]   BREAST BIOPSY  08/10/2024   US  RT RADIOACTIVE SEED LOC 08/10/2024 [facility]   BREAST LUMPECTOMY WITH RADIOACTIVE SEED AND SENTINEL LYMPH NODE BIOPSY Right 08/10/2024   Procedure: BREAST LUMPECTOMY WITH RADIOACTIVE SEED AND SENTINEL LYMPH NODE BIOPSY;  Surgeon: Vanderbilt Ned, MD;  Location: [facility];  Service: General;  Laterality: Right;   COLONOSCOPY N/A 07/12/2015   Procedure: COLONOSCOPY;  Surgeon: Claudis RAYMOND Rivet, MD;  Location: [facility];  Service: Endoscopy;  Laterality: N/A;  1030   COLONOSCOPY WITH PROPOFOL  N/A 11/18/2023   Procedure: COLONOSCOPY WITH PROPOFOL ;  Surgeon: Eartha Angelia Sieving, MD;  Location: [facility];  Service: Gastroenterology;  Laterality: N/A;  8:15AM;ASA 1   NM MYOCAR PERF WALL MOTION  02/15/2007   dipyridamole myoview; EF 70%, no wall motion abnormalities, no inducible ischemia, low risk    OVARIAN CYST  REMOVAL Left 09/16/1985   POLYPECTOMY  11/18/2023   Procedure: POLYPECTOMY, INTESTINE;  Surgeon: Eartha Angelia Sieving, MD;  Location: [facility];  Service: Gastroenterology;;   PORTACATH PLACEMENT  N/A 08/10/2024   Procedure: INSERTION, TUNNELED CENTRAL VENOUS DEVICE, WITH PORT;  Surgeon: Vanderbilt Ned, MD;  Location: [facility];  Service: General;  Laterality: N/A;  PORT PLACEMENT WITH ULTRASOUND GUIDANCE   TONSILECTOMY/ADENOIDECTOMY WITH MYRINGOTOMY  09/16/1956   TONSILLECTOMY     TRANSTHORACIC ECHOCARDIOGRAM  02/15/2007   RV mildly dlated; LV normal in size; mild MR with thickened MV leaflets; mild TR; mild pulm valve regurg   VAGINAL DELIVERY     x3    I have reviewed the social history and family history with the patient and they are unchanged from previous note.  ALLERGIES:  is allergic to bee venom, crestor [rosuvastatin], erythromycin, rosuvastatin calcium, pravastatin , zetia  [ezetimibe ], aleve [naproxen sodium], tetracyclines & related, vytorin [ezetimibe -simvastatin], and wound dressing adhesive.  MEDICATIONS:  Current Outpatient Medications  Medication Sig Dispense Refill   albuterol  (VENTOLIN  HFA) 108 (90 Base) MCG/ACT inhaler Inhale 2 puffs into the lungs every 6 (six) hours as needed for wheezing or shortness of breath. 1 each 0   allopurinol  (ZYLOPRIM ) 300 MG tablet TAKE 1 TABLET BY MOUTH EVERY  EVENING 100 tablet 0   Ascorbic Acid (VITAMIN C PO) Take by mouth.     atenolol  (TENORMIN ) 50 MG tablet TAKE 1 TABLET BY MOUTH DAILY 90 tablet 3   azelastine  (ASTELIN ) 0.1 % nasal spray Place 1 spray into both nostrils 2 (two) times daily. 30 mL 12   BIOTIN PO Take by mouth.     Blood Glucose Monitoring Suppl DEVI Check BGs daily. E11.65. May substitute to any manufacturer covered by patient's insurance. 1 each 0   budesonide (PULMICORT) 180 MCG/ACT inhaler Inhale 2 puffs into the lungs 2 (two) times daily.     cetirizine (ZYRTEC) 10 MG tablet Take 1 tablet by mouth daily.     cholecalciferol (VITAMIN D3) 25 MCG (1000 UT) tablet Take 1,000 Units by mouth daily.     Coenzyme Q10 (COQ-10) 200 MG CAPS Take by mouth daily.     EPINEPHrine  (EPIPEN  2-PAK) 0.3 mg/0.3  mL IJ SOAJ injection Inject 0.3 mg into the muscle as needed for anaphylaxis (then go to ER). 1 each 0   esomeprazole  (NEXIUM ) 20 MG capsule Take 1 capsule (20 mg total) by mouth daily at 12 noon. For GERD 100 capsule 3   Glucose Blood (BLOOD GLUCOSE TEST STRIPS) STRP Check BGs daily. E11.65. May substitute to any manufacturer covered by patient's insurance. 100 strip 3   ketoconazole  (NIZORAL ) 2 % cream Apply 1 Application topically daily. X7-14 days to rash 15 g 0   Lancet Device MISC Check BGs daily. E11.65. May substitute to any manufacturer covered by patient's insurance. 1 each 0   montelukast  (SINGULAIR ) 10 MG tablet TAKE 1 TABLET BY MOUTH IN THE  MORNING 100 tablet 2   Multiple Vitamin (MULTIVITAMIN) capsule Take 1 capsule by mouth daily.      oxyCODONE  (OXY IR/ROXICODONE ) 5 MG immediate release tablet Take 1 tablet (5 mg total) by mouth every 6 (six) hours as needed for severe pain (pain score 7-10). 15 tablet 0   rOPINIRole (REQUIP) 0.25 MG tablet Take 0.25 mg by mouth as needed.     sertraline  (ZOLOFT ) 25 MG tablet Take 25 mg by mouth daily.     triamcinolone  ointment (KENALOG ) 0.1 % Apply 1 Application topically 2 (  two) times daily. 30 g 0   Bempedoic Acid  (NEXLETOL ) 180 MG TABS Take 1 tablet (180 mg total) by mouth daily. (Patient not taking: Reported on 08/23/2024) 30 tablet 11   Evolocumab  (REPATHA  SURECLICK) 140 MG/ML SOAJ Inject 140 mg into the skin every 14 (fourteen) days. (Patient not taking: Reported on 08/23/2024) 2 mL 11   Lancets Misc. MISC Check BGs daily. E11.65. May substitute to any manufacturer covered by patient's insurance. (Patient not taking: Reported on 08/23/2024) 100 each 3   No current facility-administered medications for this visit.    PHYSICAL EXAMINATION: ECOG PERFORMANCE STATUS: 1 - Symptomatic but completely ambulatory  Vitals:   08/23/24 1333  BP: 134/86  Pulse: 69  Resp: 17  Temp: 97.6 F (36.4 C)  SpO2: 97%   Wt Readings from Last 3 Encounters:   08/23/24 181 lb 1.6 oz (82.1 kg)  08/10/24 178 lb 2.1 oz (80.8 kg)  08/04/24 177 lb (80.3 kg)     GENERAL:alert, no distress and comfortable SKIN: skin color, texture, turgor are normal, no rashes or significant lesions EYES: normal, Conjunctiva are pink and non-injected, sclera clear NECK: supple, thyroid  normal size, non-tender, without nodularity LYMPH:  no palpable lymphadenopathy in the cervical, axillary  LUNGS: clear to auscultation and percussion with normal breathing effort HEART: regular rate & rhythm and no murmurs and no lower extremity edema ABDOMEN:abdomen soft, non-tender and normal bowel sounds Musculoskeletal:no cyanosis of digits and no clubbing  NEURO: alert & oriented x 3 with fluent speech, no focal motor/sensory deficits Breasts: Breast inspection showed them to be symmetrical with no nipple discharge.  Her incision in the right axilla and the breast are healing well, no discharge or skin erythema.   Physical Exam    LABORATORY DATA:  I have reviewed the data as listed    Latest Ref Rng & Units 08/23/2024    1:12 PM 07/20/2024   12:59 PM 05/03/2024    9:03 AM  CBC  WBC 4.0 - 10.5 K/uL 8.9  7.7  8.5   Hemoglobin 12.0 - 15.0 g/dL 86.4  86.2  85.3   Hematocrit 36.0 - 46.0 % 39.4  38.9  43.2   Platelets 150 - 400 K/uL 186  195  201         Latest Ref Rng & Units 08/23/2024    1:12 PM 07/20/2024   12:59 PM 05/03/2024    9:03 AM  CMP  Glucose 70 - 99 mg/dL 862  890  884   BUN 8 - 23 mg/dL 13  16  20    Creatinine 0.44 - 1.00 mg/dL 9.10  9.03  8.97   Sodium 135 - 145 mmol/L 138  139  141   Potassium 3.5 - 5.1 mmol/L 4.4  4.6  4.6   Chloride 98 - 111 mmol/L 102  105  101   CO2 22 - 32 mmol/L 27  27  22    Calcium 8.9 - 10.3 mg/dL 89.9  9.9  9.6   Total Protein 6.5 - 8.1 g/dL 7.0  7.3    Total Bilirubin 0.0 - 1.2 mg/dL 0.5  0.5    Alkaline Phos 38 - 126 U/L 88  59    AST 15 - 41 U/L 46  47    ALT 0 - 44 U/L 51  36        RADIOGRAPHIC STUDIES: I have  personally reviewed the radiological images as listed and agreed with the findings in the report. No results found.  No orders of the defined types were placed in this encounter.  All questions were answered. The patient knows to call the clinic with any problems, questions or concerns. No barriers to learning was detected. The total time spent in the appointment was 40 minutes, including review of chart and various tests results, discussions about plan of care and coordination of care plan     Onita Mattock, MD 08/23/2024

## 2024-08-24 ENCOUNTER — Telehealth: Payer: Self-pay

## 2024-08-24 ENCOUNTER — Encounter: Payer: Self-pay | Admitting: *Deleted

## 2024-08-24 ENCOUNTER — Other Ambulatory Visit: Payer: Self-pay | Admitting: Hematology

## 2024-08-24 DIAGNOSIS — C50411 Malignant neoplasm of upper-outer quadrant of right female breast: Secondary | ICD-10-CM

## 2024-08-24 MED ORDER — PROCHLORPERAZINE MALEATE 10 MG PO TABS: 10.0000 mg | ORAL_TABLET | Freq: Four times a day (QID) | ORAL | 1 refills | Status: AC | PRN

## 2024-08-24 MED ORDER — ONDANSETRON HCL 8 MG PO TABS: 8.0000 mg | ORAL_TABLET | Freq: Three times a day (TID) | ORAL | 1 refills | Status: AC | PRN

## 2024-08-24 MED ORDER — DEXAMETHASONE 4 MG PO TABS: ORAL_TABLET | ORAL | 1 refills | Status: AC

## 2024-08-24 MED ORDER — LIDOCAINE-PRILOCAINE 2.5-2.5 % EX CREA: TOPICAL_CREAM | CUTANEOUS | 3 refills | Status: AC

## 2024-08-24 NOTE — Progress Notes (Signed)
 START ON PATHWAY REGIMEN - Breast     A cycle is every 21 days:     Cyclophosphamide      Docetaxel   **Always confirm dose/schedule in your pharmacy ordering system**  Patient Characteristics: Postoperative without Neoadjuvant Therapy, M0 (Pathologic Staging), Invasive Disease, Adjuvant Therapy, HER2 Negative, ER Negative, Node Negative, pT1a-c, N17mi or pT1c or Higher, pN0 Therapeutic Status: Postoperative without Neoadjuvant Therapy, M0 (Pathologic Staging) AJCC T Category: pT2 AJCC N Category: pN0 AJCC M Category: cM0 AJCC Grade: G3 ER Status: Negative (-) PR Status: Negative (-) HER2 Status: Negative (-) Oncotype Dx Recurrence Score: Not Appropriate AJCC 8 Stage Grouping: IIA Intent of Therapy: Curative Intent, Discussed with Patient

## 2024-08-24 NOTE — Telephone Encounter (Signed)
 Pt [name] stating that she has decided to do option#2 that Dr Lanny discussed with the pt in clinic on 08/23/2024.  Pt would like to speak with Dr Lanny or someone from her Team regarding the next steps.

## 2024-08-24 NOTE — Progress Notes (Signed)
 Returned call to patient per Dr. Demetra nurse. Patient stated she has reviewed the information regarding both types of chemo and has decided to have the TC. She is coming for her chemo education tomorrow.

## 2024-08-25 ENCOUNTER — Other Ambulatory Visit: Payer: Self-pay

## 2024-08-25 ENCOUNTER — Encounter: Payer: Self-pay | Admitting: Hematology

## 2024-08-25 ENCOUNTER — Inpatient Hospital Stay

## 2024-08-26 ENCOUNTER — Encounter: Payer: Self-pay | Admitting: *Deleted

## 2024-08-26 NOTE — Progress Notes (Deleted)
 OUTPATIENT PHYSICAL THERAPY BREAST CANCER POST OP FOLLOW UP   Patient Name: [name] MRN: [id number] DOB:December 30, 1950, 73 y.o., female Today's Date: 08/26/2024  END OF SESSION:   Past Medical History:  Diagnosis Date   Allergy    Anxiety    Asthma    under control   Back pain    Breast cancer (HCC) 07/2024   right breast ductal adenocarcinoma   GERD (gastroesophageal reflux disease)    Hyperlipidemia    Hypertension    Tendinitis of left rotator cuff 07/20/2020   Transient memory loss 12/09/2014   Type 2 diabetes mellitus (HCC)    Past Surgical History:  Procedure Laterality Date   bladder mesh     BREAST BIOPSY Right 07/08/2024   US  RT BREAST BX W LOC DEV 1ST LESION IMG BX SPEC US  GUIDE 07/08/2024 [facility]   BREAST BIOPSY  08/10/2024   US  RT RADIOACTIVE SEED LOC 08/10/2024 [facility]   BREAST LUMPECTOMY WITH RADIOACTIVE SEED AND SENTINEL LYMPH NODE BIOPSY Right 08/10/2024   Procedure: BREAST LUMPECTOMY WITH RADIOACTIVE SEED AND SENTINEL LYMPH NODE BIOPSY;  Surgeon: Vanderbilt Ned, MD;  Location: [facility];  Service: General;  Laterality: Right;   COLONOSCOPY N/A 07/12/2015   Procedure: COLONOSCOPY;  Surgeon: Claudis RAYMOND Rivet, MD;  [facility];  Service: Endoscopy;  Laterality: N/A;  1030   COLONOSCOPY WITH PROPOFOL  N/A 11/18/2023   Procedure: COLONOSCOPY WITH PROPOFOL ;  Surgeon: Eartha Angelia Sieving, MD;  Location: [facility];  Service: Gastroenterology;  Laterality: N/A;  8:15AM;ASA 1   NM MYOCAR PERF WALL MOTION  02/15/2007   dipyridamole myoview; EF 70%, no wall motion abnormalities, no inducible ischemia, low risk    OVARIAN CYST REMOVAL Left 09/16/1985   POLYPECTOMY  11/18/2023   Procedure: POLYPECTOMY, INTESTINE;  Surgeon: Eartha Angelia, Sieving, MD;  Location: [facility];  Service: Gastroenterology;;   PORTACATH PLACEMENT N/A 08/10/2024   Procedure: INSERTION, TUNNELED CENTRAL VENOUS DEVICE, WITH PORT;   Surgeon: Vanderbilt Ned, MD;  Location: [facility];  Service: General;  Laterality: N/A;  PORT PLACEMENT WITH ULTRASOUND GUIDANCE   TONSILECTOMY/ADENOIDECTOMY WITH MYRINGOTOMY  09/16/1956   TONSILLECTOMY     TRANSTHORACIC ECHOCARDIOGRAM  02/15/2007   RV mildly dlated; LV normal in size; mild MR with thickened MV leaflets; mild TR; mild pulm valve regurg   VAGINAL DELIVERY     x3   Patient Active Problem List   Diagnosis Date Noted   Malignant neoplasm of upper-outer quadrant of right breast in female, estrogen receptor negative (HCC) 07/20/2024   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 04/15/2024   History of colonic polyps 11/18/2023   Statin-induced myositis 06/23/2023   Diabetes mellitus (HCC) 04/16/2023   Fatty liver 04/16/2023   Abnormal cervical Papanicolaou smear 04/16/2023   Asthma    Cervical radiculopathy 09/18/2018   DDD (degenerative disc disease), cervical 09/18/2018   Overactive bladder 04/23/2018   Family history of heart disease 11/21/2017   Other fatigue 11/21/2017   Anxiety, generalized 05/14/2016   Gastroesophageal reflux disease without esophagitis 02/13/2016   Obesity (BMI 30-39.9) 02/13/2016   Vitamin D  deficiency 02/13/2016   Unspecified convulsions (HCC) 12/14/2014   Hypertension associated with diabetes (HCC) 12/09/2014   Hyperlipidemia associated with type 2 diabetes mellitus (HCC) 12/09/2014    PCP: Norene Fielding, DO   REFERRING PROVIDER: Ned Vanderbilt, MD   REFERRING DIAG:  C50.411,Z17.1 (ICD-10-CM) - Malignant neoplasm of upper-outer quadrant of right breast in female, estrogen receptor negative (HCC)  THERAPY DIAG:  No diagnosis found.  Rationale for Evaluation and Treatment: Rehabilitation  ONSET DATE: 07/08/24  SUBJECTIVE:                                                                                                                                                                                           SUBJECTIVE  STATEMENT: ***  PERTINENT HISTORY:  Patient was diagnosed on July 08, 2024 with right grade 3 invasive ductal carcinoma. It measures 1.8 cm and is located in the upper inner quadrant. It is triple negative with Ki-67 40%. Lumpectomy 08/10/24 with 1 neg node removed.  Will be starting TC every 3 weeks x 4.    PATIENT GOALS:  Reassess how my recovery is going related to arm function, pain, and swelling.  PAIN:  Are you having pain? {OPRCPAIN:27236}  PRECAUTIONS: Recent Surgery, {RIGHT/LEFT:21944} UE Lymphedema risk, {Therapy precautions:24002}  RED FLAGS: {PT Red Flags:29287}   ACTIVITY LEVEL / LEISURE: ***   OBJECTIVE:   PATIENT SURVEYS:  QUICK DASH: ***  OBSERVATIONS: ***  POSTURE:  ***  LYMPHEDEMA ASSESSMENT:   A/PROM RIGHT   eval    Shoulder extension 48  Shoulder flexion 160  Shoulder abduction 175  Shoulder internal rotation 65  Shoulder external rotation 79                          (Blank rows = not tested)   A/PROM LEFT   eval  Shoulder extension 58  Shoulder flexion 156  Shoulder abduction 173  Shoulder internal rotation 58  Shoulder external rotation 83                          (Blank rows = not tested)   CERVICAL AROM: All within normal limits: WFL     UPPER EXTREMITY STRENGTH: 5/5 bilaterally   LYMPHEDEMA ASSESSMENTS (in cm):    LANDMARK RIGHT   eval  10 cm proximal to olecranon process from proximal aspect of olecranon 30.7  Olecranon process 26  10 cm proximal to ulnar styloid process from proximal aspect of styloid process 23.7  Just distal to ulnar styloid process 16.8  Across hand at thumb web space 20  At base of 2nd digit 6.7  (Blank rows = not tested)   LANDMARK LEFT   eval  10 cm proximal to olecranon process from proximal aspect of olecranon 30.6   Olecranon process 26.1  10 cm proximal to ulnar styloid process from proximal aspect of styloid process 22.4  Just distal to ulnar styloid process 17.3  Across hand at thumb  web space 20.1  At base of 2nd digit 6.4  (Blank rows = not tested) Surgery type/Date: *** Number of lymph nodes removed: *** Current/past treatment (chemo, radiation, hormone therapy): *** Other symptoms:  Heaviness/tightness {yes/no:20286} Pain {yes/no:20286} Pitting edema {yes/no:20286} Infections {yes/no:20286} Decreased scar mobility {yes/no:20286} Stemmer sign {yes/no:20286}  PATIENT EDUCATION:  Education details: *** Person educated: {Person educated:25204} Education method: {Education Method:25205} Education comprehension: {Education Comprehension:25206}  HOME EXERCISE PROGRAM: Reviewed previously given post op HEP. ***  ASSESSMENT:  CLINICAL IMPRESSION: ***  Pt will benefit from skilled therapeutic intervention to improve on the following deficits: Decreased knowledge of precautions, impaired UE functional use, pain, decreased ROM, postural dysfunction.   PT treatment/interventions: ADL/Self care home management, {rehab planned interventions:25118::97110-Therapeutic exercises,97530- Therapeutic [phone]- Neuromuscular re-education,97535- Self Rjmz,02859- Manual therapy,Patient/Family education}   GOALS: Goals reviewed with patient? {yes/no:20286}  GOALS MET AT EVAL:  GOALS Name Target Date Goal status  1 Pt will be able to verbalize understanding of pertinent lymphedema risk reduction practices relevant to her dx specifically related to skin care.  Baseline:  No knowledge Eval Achieved at eval  2 Pt will be able to return demo and/or verbalize understanding of the post op HEP related to regaining shoulder ROM. Baseline:  No knowledge Eval Achieved at eval  3 Pt will be able to verbalize understanding of the importance of viewing the post op After Breast CA Class video for further lymphedema risk reduction education and therapeutic exercise.  Baseline:  No knowledge Eval Achieved at eval   LONG TERM GOALS:  (STG=LTG)  GOALS Name Target Date   Goal status  1 Pt will demonstrate she has regained full shoulder ROM and function post operatively compared to baselines.  Baseline: *** INITIAL  2  *** INITIAL  3  *** INITIAL  4  *** INITIAL     PLAN:  PT FREQUENCY/DURATION: ***  PLAN FOR NEXT SESSION: ***   [facility]  [address]  [place] [address]  [phone]  After Breast Cancer Class Video It is recommended you view the ABC class video to be educated on lymphedema risk reduction. This video lasts for about 30 minutes. It can be viewed on our website here: [email or web address]  Scar massage You can begin gentle scar massage to you incision sites. Gently place one hand on the incision and move the skin (without sliding on the skin) in various directions. Do this for a few minutes and then you can gently massage either coconut oil or vitamin E cream into the scars.  Compression garment You should continue wearing your compression bra until you feel like you no longer have swelling.  Home exercise Program Continue doing the exercises you were given until you feel like you can do them without feeling any tightness at the end.   Walking Program Studies show that 30 minutes of walking per day (fast enough to elevate your heart rate) can significantly reduce the risk of a cancer recurrence. If you can't walk due to other medical reasons, we encourage you to find another activity you could do (like a stationary bike or water  exercise).  Posture After breast cancer surgery, people frequently sit with rounded shoulders posture because it puts their incisions on slack and feels better. If you sit like this and scar tissue forms in that position, you can become very tight and have pain sitting or standing with good posture. Try to be aware of your posture and sit and stand up tall to heal  properly.  Follow up PT: It is  recommended you return every 3 months for the first 3 years following surgery to be assessed on the SOZO machine for an L-Dex score. This helps prevent clinically significant lymphedema in 95% of patients. These follow up screens are 10 minute appointments that you are not billed for.  Larue Saddie SAUNDERS, PT 08/26/2024, 2:42 PM

## 2024-08-26 NOTE — Progress Notes (Signed)
 Patient called and asked if it would be ok to get a flu shot. Checked with Dr. Lanny and she said yes it would be fine but for her to get it prior to start of chemo (12/29). Navigator spoke to patient when she was here for her chemo education yesterday. Informed her of Dr. Demetra recommendation. She had no other questions or concerns at this time. Has navigator's information for any needs.

## 2024-08-27 ENCOUNTER — Ambulatory Visit: Admitting: Rehabilitation

## 2024-08-27 ENCOUNTER — Telehealth: Payer: Self-pay | Admitting: Rehabilitation

## 2024-08-27 DIAGNOSIS — R293 Abnormal posture: Secondary | ICD-10-CM | POA: Insufficient documentation

## 2024-08-27 DIAGNOSIS — Z9189 Other specified personal risk factors, not elsewhere classified: Secondary | ICD-10-CM | POA: Insufficient documentation

## 2024-08-27 DIAGNOSIS — Z483 Aftercare following surgery for neoplasm: Secondary | ICD-10-CM | POA: Insufficient documentation

## 2024-08-27 DIAGNOSIS — Z171 Estrogen receptor negative status [ER-]: Secondary | ICD-10-CM | POA: Insufficient documentation

## 2024-08-27 DIAGNOSIS — C50411 Malignant neoplasm of upper-outer quadrant of right female breast: Secondary | ICD-10-CM | POA: Insufficient documentation

## 2024-08-27 NOTE — Telephone Encounter (Signed)
 Pt forgot about her PT appointment day.  It was rescheduled to 12/22 at 8am

## 2024-09-03 ENCOUNTER — Other Ambulatory Visit: Payer: Self-pay

## 2024-09-03 NOTE — Progress Notes (Signed)
 Pharmacist Chemotherapy Monitoring - Initial Assessment    Anticipated start date: 09/13/24   The following has been reviewed per standard work regarding the patient's treatment regimen: The patient's diagnosis, treatment plan and drug doses, and organ/hematologic function Lab orders and baseline tests specific to treatment regimen  The treatment plan start date, drug sequencing, and pre-medications Prior authorization status  Patient's documented medication list, including drug-drug interaction screen and prescriptions for anti-emetics and supportive care specific to the treatment regimen The drug concentrations, fluid compatibility, administration routes, and timing of the medications to be used The patient's access for treatment and lifetime cumulative dose history, if applicable  The patient's medication allergies and previous infusion related reactions, if applicable   Changes made to treatment plan:  N/A  Follow up needed:  N/A  Harlene JONELLE Nasuti, RPH, 09/03/2024  3:01 PM

## 2024-09-03 NOTE — Therapy (Incomplete)
 " OUTPATIENT PHYSICAL THERAPY BREAST CANCER POST OP FOLLOW UP   Patient Name: [name] MRN: [id number] DOB:[birth year], 73 y.o., female Today's Date: 09/03/2024  END OF SESSION:   Past Medical History:  Diagnosis Date   Allergy    Anxiety    Asthma    under control   Back pain    Breast cancer (HCC) 07/2024   right breast ductal adenocarcinoma   GERD (gastroesophageal reflux disease)    Hyperlipidemia    Hypertension    Tendinitis of left rotator cuff 07/20/2020   Transient memory loss 12/09/2014   Type 2 diabetes mellitus (HCC)    Past Surgical History:  Procedure Laterality Date   bladder mesh     BREAST BIOPSY Right 07/08/2024   US  RT BREAST BX W LOC DEV 1ST LESION IMG BX SPEC US  GUIDE 07/08/2024 [facility]   BREAST BIOPSY  08/10/2024   US  RT RADIOACTIVE SEED LOC 08/10/2024 [facility]   BREAST LUMPECTOMY WITH RADIOACTIVE SEED AND SENTINEL LYMPH NODE BIOPSY Right 08/10/2024   Procedure: BREAST LUMPECTOMY WITH RADIOACTIVE SEED AND SENTINEL LYMPH NODE BIOPSY;  Surgeon: Vanderbilt Ned, MD;  Location: [facility];  Service: General;  Laterality: Right;   COLONOSCOPY N/A 07/12/2015   Procedure: COLONOSCOPY;  Surgeon: Claudis RAYMOND Rivet, MD;  [facility];  Service: Endoscopy;  Laterality: N/A;  1030   COLONOSCOPY WITH PROPOFOL  N/A 11/18/2023   Procedure: COLONOSCOPY WITH PROPOFOL ;  Surgeon: Eartha Angelia Sieving, MD;  [facility];  Service: Gastroenterology;  Laterality: N/A;  8:15AM;ASA 1   NM MYOCAR PERF WALL MOTION  02/15/2007   dipyridamole myoview; EF 70%, no wall motion abnormalities, no inducible ischemia, low risk    OVARIAN CYST REMOVAL Left 09/16/1985   POLYPECTOMY  11/18/2023   Procedure: POLYPECTOMY, INTESTINE;  Surgeon: Eartha Angelia, Sieving, MD;  [facility];  Service: Gastroenterology;;   PORTACATH PLACEMENT N/A 08/10/2024   Procedure: INSERTION, TUNNELED CENTRAL VENOUS DEVICE, WITH PORT;   Surgeon: Vanderbilt Ned, MD;  Location: [facility];  Service: General;  Laterality: N/A;  PORT PLACEMENT WITH ULTRASOUND GUIDANCE   TONSILECTOMY/ADENOIDECTOMY WITH MYRINGOTOMY  09/16/1956   TONSILLECTOMY     TRANSTHORACIC ECHOCARDIOGRAM  02/15/2007   RV mildly dlated; LV normal in size; mild MR with thickened MV leaflets; mild TR; mild pulm valve regurg   VAGINAL DELIVERY     x3   Patient Active Problem List   Diagnosis Date Noted   Malignant neoplasm of upper-outer quadrant of right breast in female, estrogen receptor negative (HCC) 07/20/2024   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 04/15/2024   History of colonic polyps 11/18/2023   Statin-induced myositis 06/23/2023   Diabetes mellitus (HCC) 04/16/2023   Fatty liver 04/16/2023   Abnormal cervical Papanicolaou smear 04/16/2023   Asthma    Cervical radiculopathy 09/18/2018   DDD (degenerative disc disease), cervical 09/18/2018   Overactive bladder 04/23/2018   Family history of heart disease 11/21/2017   Other fatigue 11/21/2017   Anxiety, generalized 05/14/2016   Gastroesophageal reflux disease without esophagitis 02/13/2016   Obesity (BMI 30-39.9) 02/13/2016   Vitamin D  deficiency 02/13/2016   Unspecified convulsions (HCC) 12/14/2014   Hypertension associated with diabetes (HCC) 12/09/2014   Hyperlipidemia associated with type 2 diabetes mellitus (HCC) 12/09/2014    PCP: Norene Fielding, DO  REFERRING PROVIDER: Ned Vanderbilt, MD  REFERRING DIAG: C50.411,Z17.1 (ICD-10-CM) - Malignant neoplasm of upper-outer quadrant of right breast in female, estrogen receptor negative (HCC)  THERAPY DIAG:  No diagnosis found.  Rationale for Evaluation and Treatment: Rehabilitation  ONSET DATE: 07/08/24  SUBJECTIVE:                                                                                                                                                                                           SUBJECTIVE  STATEMENT: ***  PERTINENT HISTORY:  Patient was diagnosed on July 08, 2024 with right grade 3 invasive ductal carcinoma. It measures 1.8 cm and is located in the upper inner quadrant. It is triple negative with Ki-67 40%. Lumpectomy with SLNB on 08/10/24 with 1 neg node.  Will be having TC chemo  PATIENT GOALS:  Reassess how my recovery is going related to arm function, pain, and swelling.  PAIN:  Are you having pain? {OPRCPAIN:27236}  PRECAUTIONS: Recent Surgery, {RIGHT/LEFT:21944} UE Lymphedema risk, {Therapy precautions:24002}  RED FLAGS: {PT Red Flags:29287}   ACTIVITY LEVEL / LEISURE: ***   OBJECTIVE:   PATIENT SURVEYS:  QUICK DASH: ***  OBSERVATIONS: ***  POSTURE:  ***  LYMPHEDEMA ASSESSMENT:   UPPER EXTREMITY AROM/PROM:   A/PROM RIGHT   eval    Shoulder extension 48  Shoulder flexion 160  Shoulder abduction 175  Shoulder internal rotation 65  Shoulder external rotation 79                          (Blank rows = not tested)   A/PROM LEFT   eval  Shoulder extension 58  Shoulder flexion 156  Shoulder abduction 173  Shoulder internal rotation 58  Shoulder external rotation 83                          (Blank rows = not tested)   CERVICAL AROM: All within normal limits: WFL     UPPER EXTREMITY STRENGTH: 5/5 bilaterally   LYMPHEDEMA ASSESSMENTS (in cm):    LANDMARK RIGHT   eval  10 cm proximal to olecranon process from proximal aspect of olecranon 30.7  Olecranon process 26  10 cm proximal to ulnar styloid process from proximal aspect of styloid process 23.7  Just distal to ulnar styloid process 16.8  Across hand at thumb web space 20  At base of 2nd digit 6.7  (Blank rows = not tested)   LANDMARK LEFT   eval  10 cm proximal to olecranon process from proximal aspect of olecranon 30.6   Olecranon process 26.1  10 cm proximal to ulnar styloid process from proximal aspect of styloid process 22.4  Just distal to ulnar styloid process 17.3   Across hand  at thumb web space 20.1  At base of 2nd digit 6.4  (Blank rows = not tested) Surgery type/Date: *** Number of lymph nodes removed: *** Current/past treatment (chemo, radiation, hormone therapy): *** Other symptoms:  Heaviness/tightness {yes/no:20286} Pain {yes/no:20286} Pitting edema {yes/no:20286} Infections {yes/no:20286} Decreased scar mobility {yes/no:20286} Stemmer sign {yes/no:20286}  PATIENT EDUCATION:  Education details: *** Person educated: {Person educated:25204} Education method: {Education Method:25205} Education comprehension: {Education Comprehension:25206}  HOME EXERCISE PROGRAM: Reviewed previously given post op HEP. ***  ASSESSMENT:  CLINICAL IMPRESSION: ***  Pt will benefit from skilled therapeutic intervention to improve on the following deficits: Decreased knowledge of precautions, impaired UE functional use, pain, decreased ROM, postural dysfunction.   PT treatment/interventions: ADL/Self care home management, {rehab planned interventions:25118::97110-Therapeutic exercises,97530- Therapeutic [phone]- Neuromuscular re-education,97535- Self Rjmz,02859- Manual therapy,Patient/Family education}   GOALS: Goals reviewed with patient? {yes/no:20286}  GOALS MET AT EVAL:  GOALS Name Target Date Goal status  1 Pt will be able to verbalize understanding of pertinent lymphedema risk reduction practices relevant to her dx specifically related to skin care.  Baseline:  No knowledge Eval Achieved at eval  2 Pt will be able to return demo and/or verbalize understanding of the post op HEP related to regaining shoulder ROM. Baseline:  No knowledge Eval Achieved at eval  3 Pt will be able to verbalize understanding of the importance of viewing the post op After Breast CA Class video for further lymphedema risk reduction education and therapeutic exercise.  Baseline:  No knowledge Eval Achieved at eval   LONG TERM GOALS:   (STG=LTG)  GOALS Name Target Date  Goal status  1 Pt will demonstrate she has regained full shoulder ROM and function post operatively compared to baselines.  Baseline: *** INITIAL  2  *** INITIAL  3  *** INITIAL  4  *** INITIAL     PLAN:  PT FREQUENCY/DURATION: ***  PLAN FOR NEXT SESSION: ***   [facility]  [address]  [place] [address]  [phone]  After Breast Cancer Class Video It is recommended you view the ABC class video to be educated on lymphedema risk reduction. This video lasts for about 30 minutes. It can be viewed on our website here: [email or web address]  Scar massage You can begin gentle scar massage to you incision sites. Gently place one hand on the incision and move the skin (without sliding on the skin) in various directions. Do this for a few minutes and then you can gently massage either coconut oil or vitamin E cream into the scars.  Compression garment You should continue wearing your compression bra until you feel like you no longer have swelling.  Home exercise Program Continue doing the exercises you were given until you feel like you can do them without feeling any tightness at the end.   Walking Program Studies show that 30 minutes of walking per day (fast enough to elevate your heart rate) can significantly reduce the risk of a cancer recurrence. If you can't walk due to other medical reasons, we encourage you to find another activity you could do (like a stationary bike or water  exercise).  Posture After breast cancer surgery, people frequently sit with rounded shoulders posture because it puts their incisions on slack and feels better. If you sit like this and scar tissue forms in that position, you can become very tight and have pain sitting or standing with good posture. Try to be aware of your posture and sit and stand up tall  to heal  properly.  Follow up PT: It is recommended you return every 3 months for the first 3 years following surgery to be assessed on the SOZO machine for an L-Dex score. This helps prevent clinically significant lymphedema in 95% of patients. These follow up screens are 10 minute appointments that you are not billed for.  Larue Saddie SAUNDERS, PT 09/03/2024, 9:23 AM  "

## 2024-09-06 ENCOUNTER — Ambulatory Visit: Admitting: Rehabilitation

## 2024-09-06 ENCOUNTER — Encounter: Payer: Self-pay | Admitting: Rehabilitation

## 2024-09-06 DIAGNOSIS — Z483 Aftercare following surgery for neoplasm: Secondary | ICD-10-CM | POA: Diagnosis present

## 2024-09-06 DIAGNOSIS — R293 Abnormal posture: Secondary | ICD-10-CM | POA: Diagnosis present

## 2024-09-06 DIAGNOSIS — Z171 Estrogen receptor negative status [ER-]: Secondary | ICD-10-CM

## 2024-09-06 DIAGNOSIS — Z9189 Other specified personal risk factors, not elsewhere classified: Secondary | ICD-10-CM

## 2024-09-06 DIAGNOSIS — C50411 Malignant neoplasm of upper-outer quadrant of right female breast: Secondary | ICD-10-CM | POA: Diagnosis present

## 2024-09-08 ENCOUNTER — Other Ambulatory Visit: Payer: Self-pay | Admitting: Family Medicine

## 2024-09-10 ENCOUNTER — Telehealth: Payer: Self-pay | Admitting: Medical Oncology

## 2024-09-10 NOTE — Telephone Encounter (Signed)
 Requests replacement for Zoloft  due to ? interaction with chemotherapy .  I LVM that someone will call her back on Monday with a response.

## 2024-09-12 NOTE — Assessment & Plan Note (Signed)
 [id number], stage IIA, triple negative - Diagnosed in October 2025, discovered on routine screening mammogram.  The tumor was 1.8 cm in the upper inner quadrant of right breast, triple negative with Ki-67 40%. -She underwent lumpectomy and sentinel lymph node biopsy, which showed 2.5 cm invasive ductal carcinoma with negative margins, 1 node was negative. -I recommend adjuvant chemotherapy TC every 3 weeks for 4 cycles. - 09/13/2024 -she presents for cycle 1 day 1 adjuvant chemotherapy with Taxotere  and Cytoxan 

## 2024-09-12 NOTE — Progress Notes (Unsigned)
 " Patient Care Team: Jolinda Norene [name], DO as PCP - General (Family Medicine) Mona Vinie BROCKS, MD as PCP - Cardiology (Cardiology) Leva Rush, MD as Consulting Physician (Obstetrics and Gynecology) [facility] 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  The tumor was 1.8 cm in the upper inner quadrant of right breast, triple negative with Ki-67 40%. -She underwent lumpectomy and sentinel lymph node biopsy, which showed 2.5 cm invasive ductal carcinoma with negative margins, 1 node was negative. -Adjuvant chemotherapy TC was recommended every 3 weeks for 4 cycles. - 09/13/2024 -she presents for cycle 1 day 1 adjuvant chemotherapy with Taxotere  and Cytoxan    Hyperglycemia Blood sugar is 416 on pretreatment labs today.  Patient with history of prediabetes controlled without medication.  She did take home dexamethasone  8 mg this morning.  States she also had potatoes with brown sugar and strawberries with cake earlier today.  Reduced dose of treatment premed dexamethasone  to 5 mg IV.instructed the patient to monitor her blood sugars very closely at home as she will continue dexamethasone  twice daily for the next 3 days.  This is likely to increase her blood sugars.  May have to consider contacting PCP to prescribe low-dose insulin  or other oral medication to control blood sugars while on chemotherapy.  Right breast cancer, ER + Patient presenting for cycle 1 day 1 chemotherapy with Taxol and Cytoxan .  She completed chemotherapy education on 08/25/2024.  We reviewed as needed medications to take at home to treat and prevent negative side effects from  chemotherapy.  States she was advised by GYN provider that sertraline  did not go well with prescribed chemotherapy.  States that she stopped taking this and did not take her dose this morning.  Will trial Paxil  10 mg.new prescription was sent to her pharmacy.   Plan Patient seen in infusion.  Labs reviewed. -mild leukocytosis with WBC 15.5 and ANC 12.7.  CBC is otherwise unremarkable. - Blood glucose 416.  CMP otherwise unremarkable. Changed sertraline  25 mg to Paxil  10 mg daily.  Reviewed use of PRN medications to help treat and prevent negative effects of chemotherapy.  Will proceed with day 1 cycle 1 chemotherapy Taxotere  and Cytoxan . Labs with follow-up cycle 2 chemotherapy as scheduled.  The patient understands the plans discussed today and is in agreement with them.  She knows to contact our office if she develops concerns prior to her next appointment.  I provided 25 minutes of face-to-face time during this encounter and > 50% was spent counseling as documented under my assessment and plan.    Powell FORBES Lessen, NP  [facility] - A DEPT OF [facility] [address] [place] [address] Dept: [phone] Dept Fax: [phone]   No orders of the defined types were placed in this encounter.     CHIEF COMPLAINT:  CC: Right breast cancer, ER -  Current Treatment: Chemotherapy TC every 3 weeks x 4 cycles  INTERVAL HISTORY:  Shannon is here today for repeat clinical assessment.  She saw Dr. Lanny on 08/23/2024.  Today, she presents for cycle 1 day 1 chemotherapy Taxotere  and Cytoxan .  She completed chemotherapy  education on 08/25/2024.  She reports feeling well today.  She denies chest pain, chest pressure, or shortness of breath. She denies headaches or visual disturbances. She denies abdominal pain, nausea, vomiting, or changes in bowel or bladder habits.  Blood sugar elevated on pretreatment labs. She reports  eating sweet status with brown sugar and strawberries and cake prior to coming for appointment today.  She has a history of prediabetes controlled without medications.  She denies fevers or chills. She denies pain. Her appetite is good. Her weight has been stable.  I have reviewed the past medical history, past surgical history, social history and family history with the patient and they are unchanged from previous note.  ALLERGIES:  is allergic to bee venom, crestor [rosuvastatin], erythromycin, rosuvastatin calcium, pravastatin , zetia  [ezetimibe ], aleve [naproxen sodium], tetracyclines & related, vytorin [ezetimibe -simvastatin], and wound dressing adhesive.  MEDICATIONS:  Current Outpatient Medications  Medication Sig Dispense Refill   PARoxetine  (PAXIL ) 10 MG tablet Take 1 tablet (10 mg total) by mouth daily. 30 tablet 1   albuterol  (VENTOLIN  HFA) 108 (90 Base) MCG/ACT inhaler Inhale 2 puffs into the lungs every 6 (six) hours as needed for wheezing or shortness of breath. 1 each 0   allopurinol  (ZYLOPRIM ) 300 MG tablet TAKE 1 TABLET BY MOUTH EVERY  EVENING 100 tablet 0   Ascorbic Acid (VITAMIN C PO) Take by mouth.     atenolol  (TENORMIN ) 50 MG tablet TAKE 1 TABLET BY MOUTH DAILY 90 tablet 3   azelastine  (ASTELIN ) 0.1 % nasal spray Place 1 spray into both nostrils 2 (two) times daily. 30 mL 12   Bempedoic Acid  (NEXLETOL ) 180 MG TABS Take 1 tablet (180 mg total) by mouth daily. (Patient not taking: Reported on 08/23/2024) 30 tablet 11   BIOTIN PO Take by mouth.     Blood Glucose Monitoring Suppl DEVI Check BGs daily. E11.65. May substitute to any manufacturer covered by patient's insurance. 1 each 0   budesonide (PULMICORT) 180 MCG/ACT inhaler Inhale 2 puffs into the lungs 2 (two) times daily.     cetirizine (ZYRTEC) 10 MG tablet Take 1 tablet by mouth daily.     cholecalciferol (VITAMIN D3) 25 MCG (1000 UT) tablet Take 1,000 Units by mouth daily.     Coenzyme Q10 (COQ-10) 200 MG CAPS Take by  mouth daily.     dexamethasone  (DECADRON ) 4 MG tablet Take 2 tabs by mouth 2 times daily starting day before chemo. Then take 2 tabs daily for 2 days starting day after chemo. Take with food. 30 tablet 1   EPINEPHrine  (EPIPEN  2-PAK) 0.3 mg/0.3 mL IJ SOAJ injection Inject 0.3 mg into the muscle as needed for anaphylaxis (then go to ER). 1 each 0   esomeprazole  (NEXIUM ) 20 MG capsule Take 1 capsule (20 mg total) by mouth daily at 12 noon. For GERD 100 capsule 3   Evolocumab  (REPATHA  SURECLICK) 140 MG/ML SOAJ Inject 140 mg into the skin every 14 (fourteen) days. (Patient not taking: Reported on 08/23/2024) 2 mL 11   Glucose Blood (BLOOD GLUCOSE TEST STRIPS) STRP Check BGs daily. E11.65. May substitute to any manufacturer covered by patient's insurance. 100 strip 3   ketoconazole  (NIZORAL ) 2 % cream Apply 1 Application topically daily. X7-14 days to rash 15 g 0   Lancet Device MISC Check BGs daily. E11.65. May substitute to any manufacturer covered by patient's insurance. 1 each 0   Lancets Misc. MISC Check BGs daily. E11.65. May substitute to any manufacturer covered by patient's insurance. (Patient  not taking: Reported on 08/23/2024) 100 each 3   lidocaine -prilocaine  (EMLA ) cream Apply to affected area once 30 g 3   montelukast  (SINGULAIR ) 10 MG tablet TAKE 1 TABLET BY MOUTH IN THE  MORNING 100 tablet 2   Multiple Vitamin (MULTIVITAMIN) capsule Take 1 capsule by mouth daily.      ondansetron  (ZOFRAN ) 8 MG tablet Take 1 tablet (8 mg total) by mouth every 8 (eight) hours as needed for nausea or vomiting. Start on the third day after chemotherapy. 30 tablet 1   oxyCODONE  (OXY IR/ROXICODONE ) 5 MG immediate release tablet Take 1 tablet (5 mg total) by mouth every 6 (six) hours as needed for severe pain (pain score 7-10). 15 tablet 0   prochlorperazine  (COMPAZINE ) 10 MG tablet Take 1 tablet (10 mg total) by mouth every 6 (six) hours as needed for nausea or vomiting. 30 tablet 1   rOPINIRole (REQUIP) 0.25 MG  tablet Take 0.25 mg by mouth as needed.     triamcinolone  ointment (KENALOG ) 0.1 % Apply 1 Application topically 2 (two) times daily. 30 g 0   No current facility-administered medications for this visit.   Facility-Administered Medications Ordered in Other Visits  Medication Dose Route Frequency Provider Last Rate Last Admin   0.9 %  sodium chloride  infusion   Intravenous Continuous Lanny Callander, MD   Stopped at 09/13/24 1443   0.9 %  sodium chloride  infusion   Intravenous Once PRN Lanny Callander, MD 999 mL/hr at 09/13/24 1555 Infusion Verify at 09/13/24 1555    HISTORY OF PRESENT ILLNESS:   Oncology History  Malignant neoplasm of upper-outer quadrant of right breast in female, estrogen receptor negative (HCC)  07/08/2024 Cancer Staging   Staging form: Breast, AJCC 8th Edition - Clinical stage from 07/08/2024: Stage IB (cT1c, cN0, cM0, G3, ER-, PR-, HER2-) - Signed by Lanny Callander, MD on 07/20/2024 Stage prefix: Initial diagnosis Histologic grading system: 3 grade system   07/20/2024 Initial Diagnosis   Malignant neoplasm of upper-outer quadrant of right breast in female, estrogen receptor negative (HCC)   08/10/2024 Cancer Staging   Staging form: Breast, AJCC 8th Edition - Pathologic stage from 08/10/2024: Stage IIA (pT2, pN0, cM0, G3, ER-, PR-, HER2-) - Signed by Lanny Callander, MD on 08/22/2024 Stage prefix: Initial diagnosis Histologic grading system: 3 grade system Residual tumor (R): R0   09/13/2024 -  Chemotherapy   Patient is on Treatment Plan : BREAST TC q21d         REVIEW OF SYSTEMS:   Constitutional: Denies fevers, chills or abnormal weight loss Eyes: Denies blurriness of vision Ears, nose, mouth, throat, and face: Denies mucositis or sore throat Respiratory: Denies cough, dyspnea or wheezes Cardiovascular: Denies palpitation, chest discomfort or lower extremity swelling Gastrointestinal:  Denies nausea, heartburn or change in bowel habits Skin: Denies abnormal skin  rashes Lymphatics: Denies new lymphadenopathy or easy bruising Neurological:Denies numbness, tingling or new weaknesses Behavioral/Psych: Mood is stable, no new changes  All other systems were reviewed with the patient and are negative.   VITALS:   Today's Vitals   09/13/24 1603  BP: (!) 167/95  Pulse: 83  Resp: 17  Temp: (!) 97.5 F (36.4 C)  SpO2: 98%  Weight: 184 lb (83.5 kg)   Body mass index is 30.15 kg/m.   Wt Readings from Last 3 Encounters:  09/13/24 184 lb (83.5 kg)  09/13/24 184 lb 8 oz (83.7 kg)  08/23/24 181 lb 1.6 oz (82.1 kg)    Body mass index is 30.15  kg/m.  Performance status (ECOG): 1 - Symptomatic but completely ambulatory  PHYSICAL EXAM:   GENERAL:alert, no distress and comfortable SKIN: skin color, texture, turgor are normal, no rashes or significant lesions EYES: normal, Conjunctiva are pink and non-injected, sclera clear OROPHARYNX:no exudate, no erythema and lips, buccal mucosa, and tongue normal  NECK: supple, thyroid  normal size, non-tender, without nodularity LYMPH:  no palpable lymphadenopathy in the cervical, axillary or inguinal LUNGS: clear to auscultation and percussion with normal breathing effort HEART: regular rate & rhythm and no murmurs and no lower extremity edema ABDOMEN:abdomen soft, non-tender and normal bowel sounds Musculoskeletal:no cyanosis of digits and no clubbing  NEURO: alert & oriented x 3 with fluent speech, no focal motor/sensory deficits  LABORATORY DATA:  I have reviewed the data as listed    Component Value Date/Time   NA 133 (L) 09/13/2024 1207   NA 141 05/03/2024 0903   K 4.1 09/13/2024 1207   CL 97 (L) 09/13/2024 1207   CO2 20 (L) 09/13/2024 1207   GLUCOSE 416 (H) 09/13/2024 1207   BUN 21 09/13/2024 1207   BUN 20 05/03/2024 0903   CREATININE 0.98 09/13/2024 1207   CREATININE 0.85 07/24/2020 1424   CALCIUM 9.9 09/13/2024 1207   PROT 7.2 09/13/2024 1207   PROT 6.5 09/30/2023 1115   ALBUMIN  4.8  09/13/2024 1207   ALBUMIN  5.0 (H) 05/03/2024 0903   AST 29 09/13/2024 1207   ALT 43 09/13/2024 1207   ALKPHOS 86 09/13/2024 1207   BILITOT 0.5 09/13/2024 1207   GFRNONAA >60 09/13/2024 1207   GFRAA 69 04/05/2020 1002    Lab Results  Component Value Date   WBC 14.5 (H) 09/13/2024   NEUTROABS 12.7 (H) 09/13/2024   HGB 13.5 09/13/2024   HCT 38.5 09/13/2024   MCV 82.8 09/13/2024   PLT 196 09/13/2024    "

## 2024-09-13 ENCOUNTER — Inpatient Hospital Stay

## 2024-09-13 ENCOUNTER — Inpatient Hospital Stay: Admitting: Nurse Practitioner

## 2024-09-13 ENCOUNTER — Encounter: Payer: Self-pay | Admitting: Nurse Practitioner

## 2024-09-13 ENCOUNTER — Telehealth: Payer: Self-pay | Admitting: Nurse Practitioner

## 2024-09-13 ENCOUNTER — Other Ambulatory Visit: Payer: Self-pay

## 2024-09-13 ENCOUNTER — Encounter: Payer: Self-pay | Admitting: Hematology

## 2024-09-13 VITALS — BP 167/95 | HR 83 | Temp 97.5°F | Resp 17 | Wt 184.0 lb

## 2024-09-13 VITALS — BP 159/92 | HR 71 | Temp 97.5°F | Resp 18 | Ht 65.5 in | Wt 184.5 lb

## 2024-09-13 DIAGNOSIS — C50411 Malignant neoplasm of upper-outer quadrant of right female breast: Secondary | ICD-10-CM | POA: Diagnosis not present

## 2024-09-13 DIAGNOSIS — Z171 Estrogen receptor negative status [ER-]: Secondary | ICD-10-CM

## 2024-09-13 LAB — CBC WITH DIFFERENTIAL (CANCER CENTER ONLY)
Abs Immature Granulocytes: 0.1 K/uL — ABNORMAL HIGH (ref 0.00–0.07)
Basophils Absolute: 0 K/uL (ref 0.0–0.1)
Basophils Relative: 0 %
Eosinophils Absolute: 0 K/uL (ref 0.0–0.5)
Eosinophils Relative: 0 %
HCT: 38.5 % (ref 36.0–46.0)
Hemoglobin: 13.5 g/dL (ref 12.0–15.0)
Immature Granulocytes: 1 %
Lymphocytes Relative: 8 %
Lymphs Abs: 1.2 K/uL (ref 0.7–4.0)
MCH: 29 pg (ref 26.0–34.0)
MCHC: 35.1 g/dL (ref 30.0–36.0)
MCV: 82.8 fL (ref 80.0–100.0)
Monocytes Absolute: 0.5 K/uL (ref 0.1–1.0)
Monocytes Relative: 3 %
Neutro Abs: 12.7 K/uL — ABNORMAL HIGH (ref 1.7–7.7)
Neutrophils Relative %: 88 %
Platelet Count: 196 K/uL (ref 150–400)
RBC: 4.65 MIL/uL (ref 3.87–5.11)
RDW: 13.5 % (ref 11.5–15.5)
WBC Count: 14.5 K/uL — ABNORMAL HIGH (ref 4.0–10.5)
nRBC: 0 % (ref 0.0–0.2)

## 2024-09-13 LAB — CMP (CANCER CENTER ONLY)
ALT: 43 U/L (ref 0–44)
AST: 29 U/L (ref 15–41)
Albumin: 4.8 g/dL (ref 3.5–5.0)
Alkaline Phosphatase: 86 U/L (ref 38–126)
Anion gap: 16 — ABNORMAL HIGH (ref 5–15)
BUN: 21 mg/dL (ref 8–23)
CO2: 20 mmol/L — ABNORMAL LOW (ref 22–32)
Calcium: 9.9 mg/dL (ref 8.9–10.3)
Chloride: 97 mmol/L — ABNORMAL LOW (ref 98–111)
Creatinine: 0.98 mg/dL (ref 0.44–1.00)
GFR, Estimated: >60 mL/min
Glucose, Bld: 416 mg/dL — ABNORMAL HIGH (ref 70–99)
Potassium: 4.1 mmol/L (ref 3.5–5.1)
Sodium: 133 mmol/L — ABNORMAL LOW (ref 135–145)
Total Bilirubin: 0.5 mg/dL (ref 0.0–1.2)
Total Protein: 7.2 g/dL (ref 6.5–8.1)

## 2024-09-13 MED ORDER — SODIUM CHLORIDE 0.9 % IV SOLN: Freq: Once | INTRAVENOUS | Status: DC | PRN

## 2024-09-13 MED ORDER — DIPHENHYDRAMINE HCL 50 MG/ML IJ SOLN
50.0000 mg | Freq: Once | INTRAMUSCULAR | Status: AC | PRN
  Administered 2024-09-13: 25 mg via INTRAVENOUS

## 2024-09-13 MED ORDER — SODIUM CHLORIDE 0.9 % IV SOLN
75.0000 mg/m2 | Freq: Once | INTRAVENOUS | Status: AC
  Administered 2024-09-13: 160 mg via INTRAVENOUS
  Filled 2024-09-13: qty 16

## 2024-09-13 MED ORDER — FAMOTIDINE IN NACL 20-0.9 MG/50ML-% IV SOLN
20.0000 mg | Freq: Once | INTRAVENOUS | Status: AC | PRN
  Administered 2024-09-13: 20 mg via INTRAVENOUS

## 2024-09-13 MED ORDER — PALONOSETRON HCL INJECTION 0.25 MG/5ML
0.2500 mg | Freq: Once | INTRAVENOUS | Status: AC
  Administered 2024-09-13: 0.25 mg via INTRAVENOUS
  Filled 2024-09-13: qty 5

## 2024-09-13 MED ORDER — CLONIDINE HCL 0.1 MG PO TABS
0.1000 mg | ORAL_TABLET | Freq: Once | ORAL | Status: AC
  Administered 2024-09-13: 0.1 mg via ORAL
  Filled 2024-09-13: qty 1

## 2024-09-13 MED ORDER — DEXAMETHASONE SOD PHOSPHATE PF 10 MG/ML IJ SOLN
5.0000 mg | Freq: Once | INTRAMUSCULAR | Status: AC
  Administered 2024-09-13: 5 mg via INTRAVENOUS

## 2024-09-13 MED ORDER — SODIUM CHLORIDE 0.9 % IV SOLN: INTRAVENOUS | Status: DC

## 2024-09-13 MED ORDER — METHYLPREDNISOLONE SODIUM SUCC 125 MG IJ SOLR
125.0000 mg | Freq: Once | INTRAMUSCULAR | Status: AC | PRN
  Administered 2024-09-13 (×2): 62.5 mg via INTRAVENOUS

## 2024-09-13 MED ORDER — SODIUM CHLORIDE 0.9 % IV SOLN
600.0000 mg/m2 | Freq: Once | INTRAVENOUS | Status: DC
  Filled 2024-09-13: qty 59

## 2024-09-13 MED ORDER — PAROXETINE HCL 10 MG PO TABS: 10.0000 mg | ORAL_TABLET | Freq: Every day | ORAL | 1 refills | Status: AC

## 2024-09-13 NOTE — Patient Instructions (Signed)
 [facility] - A DEPT [facility]  Discharge Instructions: Thank you for choosing [facility] to provide your oncology and hematology care.   If you have a lab appointment with the Cancer Center, please go directly to the Cancer Center and check in at the registration area.   Wear comfortable clothing and clothing appropriate for easy access to any Portacath or PICC line.   We strive to give you quality time with your provider. You may need to reschedule your appointment if you arrive late (15 or more minutes).  Arriving late affects you and other patients whose appointments are after yours.  Also, if you miss three or more appointments without notifying the office, you may be dismissed from the clinic at the provider's discretion.      For prescription refill requests, have your pharmacy contact our office and allow 72 hours for refills to be completed.    Today you received the following chemotherapy and/or immunotherapy agents DOCEtaxel (TAXOTERE)      To help prevent nausea and vomiting after your treatment, we encourage you to take your nausea medication as directed.  BELOW ARE SYMPTOMS THAT SHOULD BE REPORTED IMMEDIATELY: *FEVER GREATER THAN 100.4 F (38 C) OR HIGHER *CHILLS OR SWEATING *NAUSEA AND VOMITING THAT IS NOT CONTROLLED WITH YOUR NAUSEA MEDICATION *UNUSUAL SHORTNESS OF BREATH *UNUSUAL BRUISING OR BLEEDING *URINARY PROBLEMS (pain or burning when urinating, or frequent urination) *BOWEL PROBLEMS (unusual diarrhea, constipation, pain near the anus) TENDERNESS IN MOUTH AND THROAT WITH OR WITHOUT PRESENCE OF ULCERS (sore throat, sores in mouth, or a toothache) UNUSUAL RASH, SWELLING OR PAIN  UNUSUAL VAGINAL DISCHARGE OR ITCHING   Items with * indicate a potential emergency and should be followed up as soon as possible or go to the Emergency Department if any problems should occur.  Please show the CHEMOTHERAPY ALERT CARD or  IMMUNOTHERAPY ALERT CARD at check-in to the Emergency Department and triage nurse.  Should you have questions after your visit or need to cancel or reschedule your appointment, please contact [facility] - A [facility]PT [facility]  Dept: [phone]  and follow the prompts.  Office hours are 8:00 a.m. to 4:30 p.m. Monday - Friday. Please note that voicemails left after 4:00 p.m. may not be returned until the following business day.  We are closed weekends and major holidays. You have access to a nurse at all times for urgent questions. Please call the main number to the clinic Dept: [phone] and follow the prompts.   For any non-urgent questions, you may also contact your provider using MyChart. We now offer e-Visits for anyone 76 and older to request care online for non-urgent symptoms. For details visit [email or web address].   Also download the MyChart app! Go to the app store, search "MyChart", open the app, select [place], and log in with your MyChart username and password.

## 2024-09-13 NOTE — Telephone Encounter (Signed)
 Was called to the infusion suite. Patient was having acute infusion reaction. Started with severe and stabbing back pain.  She reported pain onset 12/10 in severity.  Blood pressure was much higher.  She was given 12.5 mg IV Benadryl , 20 mg IV Pepcid .  She has been given 62.5 mg IV Solu-Medrol .  Pain resolved.  Breathing became even.  Blood pressure continued to climb.  After 25 minutes, blood pressure was 210/110.  She was then given second 62.5 mg dose IV Solu-Medrol .  Hands and feet were taken off ice.  EKG was performed.  She did have first-degree heart block without any other acute abnormalities.  Recheck of blood pressure showed BP of 180/104.  Decision was made to hold the remainder of treatment today and to rechallenge her with chemotherapy starting 7:30 AM tomorrow, 09/15/2023.  She will be here at 7:30 AM.  Will add premeds to her treatment plan including Benadryl  25 mg IV, Pepcid  20 mg IV, and dexamethasone  10 mg IV.  Will start Taxotere  at 34 mL/h.  If tolerates well, will increase to 67 mL/h.  Will then titrate timing per protocol.  Plan to proceed with Cytoxan  as originally scheduled today.  Patient was given clonidine  0.1 mg p.o. and monitored for 30 minutes in infusion prior to being released to go home.  All questions were answered.

## 2024-09-13 NOTE — Progress Notes (Signed)
 BG 416 - Reduce today's dexamethasone  premed dose to 5 mg IV per Powell, NP. Patient to continue taking home dexamethasone  and closely monitor blood glucose.  Harlene Nasuti, PharmD Oncology Infusion Pharmacist 09/13/2024 1:39 PM

## 2024-09-13 NOTE — Progress Notes (Signed)
 Hypersensitivity Reaction Note  Date of event: 09/13/2024  Time of event: 1444  Type of event: Grade 2 (Moderate reaction; Requires therapy or infusion interruption, but responds promptly to interventions; prophylactic medications indicated for <24 hours)    Generic name of drug involved: Docetaxel  (Taxotere )   Initial Presentation and Response:  The patient reported and showed signs of flushing, back pain pain, and hypertension during the infusion.   Provider notified of the hypersensitivity reaction: Powell Lessen, NP  Time of provider notification: [phone]  Initial Interventions Implemented:   Infusion stopped: Yes  Medications administered - see MAR for sequence and times of administration: Normal Saline 1 L IV, Diphenhydramine  (Benadryl ) 25 mg IV, Famotidine  (Pepcid ) 20 mg IV, and Methylprednisolone  (Solu-Medrol ) 125 mg IV   Additional interventions:   Patient response to treatment: Symptoms diminished following interventions.  Remaining Course of Treatment:    The patient was unable to complete the infusion.  Was agent that likely caused hypersensitivity reaction added to Allergies List within EMR? Yes   Additional Information Regarding the Chain of Events (including reaction signs/symptoms, treatment administered, and outcome): Patients tx began at 1438 and at 1444, patient complained about sudden severe lower back pain that she rated a 12 and immediately experienced flushing in her face. Ileana, RN stopped pts tx and began fluids along with Benadryl  25 mg while Josette, NT immediately notified Powell Lessen, NP about reaction. Heather and Anders Scurry, RN assisted Ileana with symptom management. Famotidine  20 mg and Solu-medrol  125 mg was also administered and pts flushing resolved and she stated that her back pain had decreased to a rating of 3 and was continuing to improve. Pt became hypertensive during the reaction and continued to experience hypertensive as other  symptoms improved. Tx was not continued and NP ordered 0.1 mg clonidine  for hypertension which was effective prior to discharge.

## 2024-09-13 NOTE — Progress Notes (Signed)
 Reaction with first-time Taxotere . Cytoxan  not able to be given afterwards due to high BP. Patient returning tomorrow (12/30) for Taxotere  rechallenge & to receive Cytoxan . Day 2 added to Cycle 1 in treatment plan. Benadryl  and Pepcid  added to premeds and dexamethasone  10 mg confirmed per Powell, NP.  Harlene Nasuti, PharmD Oncology Infusion Pharmacist 09/13/2024 4:21 PM

## 2024-09-14 ENCOUNTER — Other Ambulatory Visit: Payer: Self-pay | Admitting: Nurse Practitioner

## 2024-09-14 ENCOUNTER — Inpatient Hospital Stay

## 2024-09-14 VITALS — BP 163/83 | HR 57 | Temp 97.7°F | Resp 18

## 2024-09-14 DIAGNOSIS — Z171 Estrogen receptor negative status [ER-]: Secondary | ICD-10-CM

## 2024-09-14 DIAGNOSIS — C50411 Malignant neoplasm of upper-outer quadrant of right female breast: Secondary | ICD-10-CM | POA: Diagnosis not present

## 2024-09-14 MED ORDER — CLONIDINE HCL 0.1 MG PO TABS
0.1000 mg | ORAL_TABLET | Freq: Once | ORAL | Status: AC
  Administered 2024-09-14: 0.1 mg via ORAL
  Filled 2024-09-14: qty 1

## 2024-09-14 MED ORDER — SODIUM CHLORIDE 0.9 % IV SOLN
75.0000 mg/m2 | Freq: Once | INTRAVENOUS | Status: AC
  Administered 2024-09-14: 160 mg via INTRAVENOUS
  Filled 2024-09-14: qty 16

## 2024-09-14 MED ORDER — PALONOSETRON HCL INJECTION 0.25 MG/5ML: 0.2500 mg | Freq: Once | INTRAVENOUS | Status: DC

## 2024-09-14 MED ORDER — SODIUM CHLORIDE 0.9 % IV SOLN: INTRAVENOUS | Status: DC

## 2024-09-14 MED ORDER — DIPHENHYDRAMINE HCL 50 MG/ML IJ SOLN
50.0000 mg | Freq: Once | INTRAMUSCULAR | Status: AC
  Administered 2024-09-14: 50 mg via INTRAVENOUS
  Filled 2024-09-14: qty 1

## 2024-09-14 MED ORDER — SODIUM CHLORIDE 0.9 % IV SOLN
600.0000 mg/m2 | Freq: Once | INTRAVENOUS | Status: AC
  Administered 2024-09-14: 1180 mg via INTRAVENOUS
  Filled 2024-09-14: qty 59

## 2024-09-14 MED ORDER — DEXAMETHASONE SOD PHOSPHATE PF 10 MG/ML IJ SOLN
10.0000 mg | Freq: Once | INTRAMUSCULAR | Status: AC
  Administered 2024-09-14: 10 mg via INTRAVENOUS

## 2024-09-14 MED ORDER — FAMOTIDINE IN NACL 20-0.9 MG/50ML-% IV SOLN
20.0000 mg | Freq: Once | INTRAVENOUS | Status: AC
  Administered 2024-09-14: 20 mg via INTRAVENOUS
  Filled 2024-09-14: qty 50

## 2024-09-14 NOTE — Progress Notes (Signed)
 Call from infusion regarding patient's blood pressure. She has received premedications for chemotherapy, pepcid , benadryl , and dexamethasone . Blood pressure after premedications was 196/96. The patient is asymptomatic. Will give 0.1 mg clonidine  and recheck blood pressure in 30 minutes. May proceed with rechallenge of chemotherapy if blood pressure shows improvement.  -Powell Lessen, NP

## 2024-09-14 NOTE — Progress Notes (Signed)
 Pt presents for C1D2 Docetaxel  (rechallenge) and Cytoxan . Pt received Pepcid , Benadryl , Dexamethasone  prior to Docetaxel . Blood pressure after premedications was 196/96. Patient is asymptomatic. Per Powell, PA-C give 0.1 mg clonidine  and recheck blood pressure in 30 minutes. May proceed with rechallenge of chemotherapy if blood pressure shows improvement. BP 30 min post clonidine  158/93. Proceed with Docetaxel  infusion. Per Heather PA-C, infusion to be started at 58mL/hr for 15 min and then titrate as first time for remainder of infusion. Post docetaxel  infusion, pt hypertensive to 202/96. Asymptomatic. 0.1mg  Clonidine  given. Waited 45 min post clonidine  to start Cytoxan . Post Cytoxan  BP 163/83, HR 57. Pt denied any adverse s/s. Per [employer], advised pt to monitor BP daily at home and contact if >170/90.

## 2024-09-14 NOTE — Progress Notes (Signed)
 Pt reports BG 231 this morning prior to apt time

## 2024-09-14 NOTE — Patient Instructions (Signed)
 [facility] - [facility]  Discharge Instructions: Thank you for choosing [facility] to provide your oncology and hematology care.   If you have a lab appointment with the Cancer Center, please go directly to the Cancer Center and check in at the registration area.   Wear comfortable clothing and clothing appropriate for easy access to any Portacath or PICC line.   We strive to give you quality time with your provider. You may need to reschedule your appointment if you arrive late (15 or more minutes).  Arriving late affects you and other patients whose appointments are after yours.  Also, if you miss three or more appointments without notifying the office, you may be dismissed from the clinic at the provider's discretion.      For prescription refill requests, have your pharmacy contact our office and allow 72 hours for refills to be completed.    Today you received the following chemotherapy and/or immunotherapy agents: Docetaxel, Cytoxan      To help prevent nausea and vomiting after your treatment, we encourage you to take your nausea medication as directed.  BELOW ARE SYMPTOMS THAT SHOULD BE REPORTED IMMEDIATELY: *FEVER GREATER THAN 100.4 F (38 C) OR HIGHER *CHILLS OR SWEATING *NAUSEA AND VOMITING THAT IS NOT CONTROLLED WITH YOUR NAUSEA MEDICATION *UNUSUAL SHORTNESS OF BREATH *UNUSUAL BRUISING OR BLEEDING *URINARY PROBLEMS (pain or burning when urinating, or frequent urination) *BOWEL PROBLEMS (unusual diarrhea, constipation, pain near the anus) TENDERNESS IN MOUTH AND THROAT WITH OR WITHOUT PRESENCE OF ULCERS (sore throat, sores in mouth, or a toothache) UNUSUAL RASH, SWELLING OR PAIN  UNUSUAL VAGINAL DISCHARGE OR ITCHING   Items with * indicate a potential emergency and should be followed up as soon as possible or go to the Emergency Department if any problems should occur.  Please show the CHEMOTHERAPY ALERT CARD or  IMMUNOTHERAPY ALERT CARD at check-in to the Emergency Department and triage nurse.  Should you have questions after your visit or need to cancel or reschedule your appointment, please contact [facility]  Dept: [phone]  and follow the prompts.  Office hours are 8:00 a.m. to 4:30 p.m. Monday - Friday. Please note that voicemails left after 4:00 p.m. may not be returned until the following business day.  We are closed weekends and major holidays. You have access to a nurse at all times for urgent questions. Please call the main number to the clinic Dept: [phone] and follow the prompts.   For any non-urgent questions, you may also contact your provider using MyChart. We now offer e-Visits for anyone 44 and older to request care online for non-urgent symptoms. For details visit [email or web address].   Also download the MyChart app! Go to the app store, search "MyChart", open the app, select [place], and log in with your MyChart username and password.

## 2024-09-15 ENCOUNTER — Encounter (HOSPITAL_COMMUNITY): Payer: Self-pay

## 2024-09-15 ENCOUNTER — Inpatient Hospital Stay

## 2024-09-15 ENCOUNTER — Telehealth: Payer: Self-pay

## 2024-09-15 VITALS — BP 128/80 | HR 67 | Temp 98.6°F | Resp 15

## 2024-09-15 DIAGNOSIS — C50411 Malignant neoplasm of upper-outer quadrant of right female breast: Secondary | ICD-10-CM | POA: Diagnosis not present

## 2024-09-15 MED ORDER — PEGFILGRASTIM-CBQV 6 MG/0.6ML ~~LOC~~ SOSY
6.0000 mg | PREFILLED_SYRINGE | Freq: Once | SUBCUTANEOUS | Status: AC
  Administered 2024-09-15: 6 mg via SUBCUTANEOUS
  Filled 2024-09-15: qty 0.6

## 2024-09-15 NOTE — Telephone Encounter (Signed)
-----   Message from Nurse Ileana [name], RN sent at 09/14/2024  2:51 PM EST ----- Regarding: Docetaxel  (rechallenge), cytoxan . Pt of feng Docetaxel  rechallenge and first time cytoxan . Pt of feng. Pt had infusion rxn first time (back pain, flushing, hypertension). On rechallenge, pt had significant hypertension requiring x2 doses of clonidine . Pt able to complete both infusions. Pt educated on monitoring BP at home daily. Please call to check in with pt please, thanks!

## 2024-09-17 ENCOUNTER — Other Ambulatory Visit: Payer: Self-pay

## 2024-09-17 NOTE — Progress Notes (Signed)
 REFERRING PROVIDER: Lanny Callander, MD [address] [place],  [place] [address]  PRIMARY PROVIDER:  Jolinda Norene [name], DO  PRIMARY REASON FOR VISIT:  1. Malignant neoplasm of upper-outer quadrant of right breast in female, estrogen receptor negative (HCC)   2. Family history of breast cancer   3. Family history of prostate cancer   4. Family history of leukemia   5. Family history of ovarian cancer   6. Family history of liver cancer    HISTORY OF PRESENT ILLNESS:   [name], a 74 y.o. female, was seen for a [place] cancer genetics consultation at the request of Callander Lanny, MD due to a personal and family history of cancer.  [name] presents to clinic today to discuss the possibility of a hereditary predisposition to cancer, genetic testing, and to further clarify her future cancer risks, as well as potential cancer risks for family members.   Diagnosis: In November 2025, at the age of 61, [name] was diagnosed with Malignant neoplasm of upper-outer quadrant of right breast in female, estrogen receptor negative (HCC). Treatment has included lumpectomy.   CANCER HISTORY:  Oncology History  Malignant neoplasm of upper-outer quadrant of right breast in female, estrogen receptor negative (HCC)  07/08/2024 Cancer Staging   Staging form: Breast, AJCC 8th Edition - Clinical stage from 07/08/2024: Stage IB (cT1c, cN0, cM0, G3, ER-, PR-, HER2-) - Signed by Lanny Callander, MD on 07/20/2024 Stage prefix: Initial diagnosis Histologic grading system: 3 grade system   07/20/2024 Initial Diagnosis   Malignant neoplasm of upper-outer quadrant of right breast in female, estrogen receptor negative (HCC)   08/10/2024 Cancer Staging   Staging form: Breast, AJCC 8th Edition - Pathologic stage from 08/10/2024: Stage IIA (pT2, pN0, cM0, G3, ER-, PR-, HER2-) - Signed by Lanny Callander, MD on 08/22/2024 Stage prefix: Initial diagnosis Histologic grading system: 3 grade system Residual tumor (R):  R0   09/13/2024 -  Chemotherapy   Patient is on Treatment Plan : BREAST TC q21d      RISK FACTORS:  Menarche was at age 15-15.  First live birth at age 24.  OCP use for approximately less than 1 year.  Oophorectomy: no.  Hysterectomy: no.  Menopausal status: postmenopausal.  HRT use: 0 years. Colonoscopy: yes; abnormal. 11/18/2023 - 2x Tubular adenoma: COLON, ASCENDING, TRANSVERSE, POLYPECTOMY:  07/12/2015 - Colon, polyp(s), splenic flexure - TUBULAR ADENOMA. NO HIGH GRADE DYSPLASIA OR MALIGNANCY IDENTIFIED. Mammogram within the last year: yes. Number of breast biopsies: 1. Up to date with pelvic exams: yes (04/16/2023) Any excessive radiation exposure or other environmental exposures the past: yes, worked on tobacco farms  Tobacco Use: Never  Past Medical History:  Diagnosis Date   Allergy    Anxiety    Asthma    under control   Back pain    Breast cancer (HCC) 07/2024   right breast ductal adenocarcinoma   GERD (gastroesophageal reflux disease)    Hyperlipidemia    Hypertension    Tendinitis of left rotator cuff 07/20/2020   Transient memory loss 12/09/2014   Type 2 diabetes mellitus (HCC)    Past Surgical History:  Procedure Laterality Date   bladder mesh     BREAST BIOPSY Right 07/08/2024   US  RT BREAST BX W LOC DEV 1ST LESION IMG BX SPEC US  GUIDE 07/08/2024 [facility]   BREAST BIOPSY  08/10/2024   US  RT RADIOACTIVE SEED LOC 08/10/2024 [facility]   BREAST LUMPECTOMY WITH RADIOACTIVE SEED AND SENTINEL LYMPH NODE  BIOPSY Right 08/10/2024   Procedure: BREAST LUMPECTOMY WITH RADIOACTIVE SEED AND SENTINEL LYMPH NODE BIOPSY;  Surgeon: Vanderbilt Ned, MD;  Location: [facility];  Service: General;  Laterality: Right;   COLONOSCOPY N/A 07/12/2015   Procedure: COLONOSCOPY;  Surgeon: Claudis RAYMOND Rivet, MD;  [facility];  Service: Endoscopy;  Laterality: N/A;  1030   COLONOSCOPY WITH PROPOFOL  N/A 11/18/2023   Procedure: COLONOSCOPY WITH  PROPOFOL ;  Surgeon: Eartha Angelia Sieving, MD;  Location: [facility];  Service: Gastroenterology;  Laterality: N/A;  8:15AM;ASA 1   NM MYOCAR PERF WALL MOTION  02/15/2007   dipyridamole myoview; EF 70%, no wall motion abnormalities, no inducible ischemia, low risk    OVARIAN CYST REMOVAL Left 09/16/1985   POLYPECTOMY  11/18/2023   Procedure: POLYPECTOMY, INTESTINE;  Surgeon: Eartha Angelia, Sieving, MD;  Location: [facility];  Service: Gastroenterology;;   PORTACATH PLACEMENT N/A 08/10/2024   Procedure: INSERTION, TUNNELED CENTRAL VENOUS DEVICE, WITH PORT;  Surgeon: Vanderbilt Ned, MD;  Location: [facility];  Service: General;  Laterality: N/A;  PORT PLACEMENT WITH ULTRASOUND GUIDANCE   TONSILECTOMY/ADENOIDECTOMY WITH MYRINGOTOMY  09/16/1956   TONSILLECTOMY     TRANSTHORACIC ECHOCARDIOGRAM  02/15/2007   RV mildly dlated; LV normal in size; mild MR with thickened MV leaflets; mild TR; mild pulm valve regurg   VAGINAL DELIVERY     x3   Social History   Socioeconomic History   Marital status: Married    Spouse name: Not on file   Number of children: 3   Years of education: Not on file   Highest education level: Not on file  Occupational History   Not on file  Tobacco Use   Smoking status: Never   Smokeless tobacco: Never  Vaping Use   Vaping status: Never Used  Substance and Sexual Activity   Alcohol use: No   Drug use: No   Sexual activity: Not Currently    Birth control/protection: Post-menopausal  Other Topics Concern   Not on file  Social History Narrative   Not on file   Social Drivers of Health   Tobacco Use: Low Risk (09/13/2024)   Patient History    Smoking Tobacco Use: Never    Smokeless Tobacco Use: Never    Passive Exposure: Not on file  Financial Resource Strain: Low Risk (08/04/2023)   Overall Financial Resource Strain (CARDIA)    Difficulty of Paying Living Expenses: Not hard at all  Food Insecurity: No Food Insecurity  (08/04/2024)   Epic    Worried About Programme Researcher, Broadcasting/film/video in the Last Year: Never true    Ran Out of Food in the Last Year: Never true  Transportation Needs: No Transportation Needs (08/04/2024)   Epic    Lack of Transportation (Medical): No    Lack of Transportation (Non-Medical): No  Physical Activity: Insufficiently Active (08/04/2024)   Exercise Vital Sign    Days of Exercise per Week: 3 days    Minutes of Exercise per Session: 30 min  Stress: No Stress Concern Present (08/04/2024)   [employer] of Occupational Health - Occupational Stress Questionnaire    Feeling of Stress: Not at all  Social Connections: Socially Integrated (08/04/2024)   Social Connection and Isolation Simmons    Frequency of Communication with Friends and Family: More than three times a week    Frequency of Social Gatherings with Friends and Family: More than three times a week    Attends Religious Services: More than 4 times  per year    Active Member of Clubs or Organizations: Yes    Attends Banker Meetings: More than 4 times per year    Marital Status: Married  Depression (PHQ2-9): Low Risk (09/13/2024)   Depression (PHQ2-9)    PHQ-2 Score: 0  Alcohol Screen: Low Risk (08/04/2023)   Alcohol Screen    Last Alcohol Screening Score (AUDIT): 0  Housing: Unknown (08/04/2024)   Epic    Unable to Pay for Housing in the Last Year: No    Number of Times Moved in the Last Year: Not on file    Homeless in the Last Year: No  Utilities: Not At Risk (08/04/2024)   Epic    Threatened with loss of utilities: No  Health Literacy: Patient Unable To Answer (08/04/2024)   B1300 Health Literacy    Frequency of need for help with medical instructions: Patient unable to respond    FAMILY HISTORY:  We obtained a detailed, 4-generation family history pasted below.   Chelby Browning is unaware of relatives completing genetic testing for hereditary cancer risks.  There is no reported Ashkenazi Jewish  ancestry.  There is no known consanguinity.  Pedigree Summary Mother - Breast Cancer dx. 80 Maternal Uncle - Prostate cancer Maternal Grandfather - Unknown Cancer  Maternal Grandmother - Breast Mass (unknown if cancerous) Father - Benign Breast Mass Paternal Grandmother - Ovarian Cancer dx. 89s Paternal 1st Cousin 1- Metastatic Prostate Cancer dx. 50+ Paternal 1st Cousin 2 - Liver Cancer Paternal 1st Cousin 3 - Liver Cancer Significant diagnoses are listed below: Family History  Problem Relation Age of Onset   Heart disease Mother    Kidney disease Mother    Breast cancer Mother 62   Coronary artery disease Mother        CABG at age 15   Dementia Father    Stroke Father    Stroke Maternal Grandmother    Cancer Maternal Grandfather        unknown type   Ovarian cancer Paternal Grandmother 73 - 33   Kidney disease Paternal Grandfather    Prostate cancer Maternal Uncle    Liver cancer Paternal Cousin    Prostate cancer Paternal Cousin        mets to liver   Liver cancer Paternal Cousin    Leukemia Brother 32   Prostate cancer Paternal Cousin        dx. 50+ Metastasis to Liver   Liver cancer Paternal Cousin    Liver cancer Paternal Cousin     GENETIC COUNSELING ASSESSMENT: [name] is a 74 y.o. female with a personal and family history of cancer which is somewhat suggestive of a hereditary cancer predisposition syndrome. We, therefore, discussed and recommended the following at today's visit.   DISCUSSION: We discussed that, in general, most cancer is not inherited in families, but instead is sporadic or familial. Sporadic cancers occur by chance and typically happen at older ages (>50 years) as this type of cancer is caused by genetic changes acquired during an individuals lifetime. Some families have more cancers than would be expected by chance; however, the ages or types of cancer are not consistent with a known genetic mutation or known genetic mutations have been  ruled out. This type of familial cancer is thought to be due to a combination of multiple genetic, environmental, hormonal, and lifestyle factors. While this combination of factors likely increases the risk of cancer, the exact source of this risk is not currently identifiable or  testable.  We discussed that 5-10% of cancer is the result of germline (heritable) genetic variants, with most cases associated with BRCA1/BRCA2. There are other genes that can be associated with hereditary cancer syndromes. These include ATM, BARD1, CDH1, CHEK2, NF1, PALB2, PTEN, RAD51C, RAD51D, STK11 and TP53. We discussed that testing is beneficial for several reasons including knowing how to follow individuals after completing their treatment, identifying whether potential treatment options such as PARP inhibitors would be beneficial, and understanding if other family members could be at risk for cancer and allow them to undergo genetic testing.   We reviewed the characteristics, features and inheritance patterns of hereditary cancer syndromes. We also discussed genetic testing, including the appropriate family members to test, the process of testing, insurance coverage and turn-around-time for results. We discussed the implications of a negative, positive, carrier and/or variant of uncertain significant result. [name]  was offered a common hereditary cancer Simmons (40 genes) and an expanded pan-cancer Simmons (77 genes). [name] was informed of the benefits and limitations of each Simmons, including that expanded pan-cancer panels contain genes that do not have clear management guidelines at this point in time.  We also discussed that as the number of genes included on a Simmons increases, the chances of variants of uncertain significance increases.  GENETIC TESTING NATIONAL CRITERIA: Based on [name]'s family history of ovarian cancer she meets medical criteria for genetic testing based on the [employer] (NCCN) guidelines.  Despite that she meets criteria, she  may still have an out of pocket cost. We discussed that she will get a text from [employer] with billing formation and her out-of-pocket cost estimate.   GENETIC TESTING CONSENT:  After considering the risks, benefits, and limitations, [name] provided informed consent to pursue genetic testing. A blood sample was sent to [facility] for analysis of the CancerNext-Expanded+RNA Simmons. Results should be available within approximately 2-3 weeks' time, at which point they will be disclosed by telephone to [name] , as will any additional recommendations warranted by these results. [name] will receive a summary of her genetic counseling visit and a copy of her results once available. This information will also be available in Epic.  [name] which includes sequencing, rearrangement, and RNA analysis for the following 77 genes: AIP, ALK, APC, ATM, AXIN2, BAP1, BARD1, BMPR1A, BRCA1, BRCA2, BRIP1, CDC73, CDH1, CDK4, CDKN1B, CDKN2A, CEBPA, CHEK2, CTNNA1, DDX41, DICER1, ETV6, FH, FLCN, GATA2, LZTR1, MAX, MBD4, MEN1, MET, MLH1, MSH2, MSH3, MSH6, MUTYH, NF1, NF2, NTHL1, PALB2, PHOX2B, PMS2, POT1, PRKAR1A, PTCH1, PTEN, RAD51C, RAD51D, RB1, RET, RPS20, RUNX1, SDHA, SDHAF2, SDHB, SDHC, SDHD, SMAD4, SMARCA4, SMARCB1, SMARCE1, STK11, SUFU, TMEM127, TP53, TSC1, TSC2, VHL, and WT1 (sequencing and deletion/duplication); EGFR, HOXB13, KIT, MITF, PDGFRA, POLD1, and POLE (sequencing only); EPCAM and GREM1 (deletion/duplication only).    GENETIC INFORMATION NONDISCRIMINATION ACT (GINA): We discussed that some people do not want to undergo genetic testing due to fear of genetic discrimination.  The Genetic Information Nondiscrimination Act (GINA) was signed into federal law in 2008. GINA prohibits health insurers and most employers from discriminating against individuals based on genetic information  (including the results of genetic tests and family history information). According to GINA, health insurance companies cannot consider genetic information to be a preexisting condition, nor can they use it to make decisions regarding coverage or rates. GINA also makes it illegal for most employers to use genetic information in making decisions about hiring, firing, promotion,  or terms of employment. It is important to note that GINA does not offer protections for life insurance, disability insurance, or long-term care insurance. GINA does not apply to those in the [employer], those who work for companies with less than 15 employees, and new life insurance or long-term disability insurance policies.  Health status due to a cancer diagnosis is not protected under GINA. More information about GINA can be found by visiting eliteclients.be. Lastly, we encouraged [name] to remain in contact with cancer genetics annually so that we can continuously update the family history and inform her of any changes in cancer genetics and testing that may be of benefit for this family.   Kjerstin Braver's questions were answered to her satisfaction today. Our contact information was provided should additional questions or concerns arise. Thank you for the referral and allowing us  to share in the care of your patient.   Resources:  Hedaya Latendresse was provided with the following:  [employer] Hereditary Cancer Testing Patient Guide Tanya CancerNext-Expanded + RNAinsight gene list  PLAN:  Testing Ordered: Tanya CancerNext-Expanded + RNAinsight  Clinic Note Faxed/Routed to [facility]'s PCP Byram, Norene [name], DO and Onita Mattock, MD   Santana Fryer, MS, [facility]  Certified Genetic Counselor  Email: [email or web address]  Phone: [phone]  I personally spent a total of 30 minutes in the care of the patient today including preparing to see the patient, counseling and  educating, placing orders, and documenting clinical information in the EHR.  The patient was joined by her husband, Rankin. Drs. Mattock Stalls, and/or Gudena were available for questions, if needed. _______________________________________________________________________ For Office Staff:  Number of people involved in session: 2 Was an Intern/ student involved with case: no

## 2024-09-17 NOTE — Progress Notes (Signed)
 Patient called in stating she is having a lot of bone pain due to her [place] treatment on Monday and Tuesday. She stated she is taking Claritin, daily with tylenol  and a half of her oxycodone  with no relief. I spoke with Powell Lessen NP. Powell stated she can take the oxy on schedule with her tylenol  and her Claritin daily. Patient stated she understood and will call us  back if it doesn't work. Patient has no other questions at this time.

## 2024-09-20 ENCOUNTER — Inpatient Hospital Stay

## 2024-09-20 DIAGNOSIS — Z171 Estrogen receptor negative status [ER-]: Secondary | ICD-10-CM

## 2024-09-20 DIAGNOSIS — C50411 Malignant neoplasm of upper-outer quadrant of right female breast: Secondary | ICD-10-CM | POA: Diagnosis present

## 2024-09-20 DIAGNOSIS — M898X9 Other specified disorders of bone, unspecified site: Secondary | ICD-10-CM | POA: Diagnosis not present

## 2024-09-20 DIAGNOSIS — Z803 Family history of malignant neoplasm of breast: Secondary | ICD-10-CM

## 2024-09-20 DIAGNOSIS — L299 Pruritus, unspecified: Secondary | ICD-10-CM | POA: Diagnosis not present

## 2024-09-20 DIAGNOSIS — K219 Gastro-esophageal reflux disease without esophagitis: Secondary | ICD-10-CM | POA: Insufficient documentation

## 2024-09-20 DIAGNOSIS — M549 Dorsalgia, unspecified: Secondary | ICD-10-CM | POA: Insufficient documentation

## 2024-09-20 DIAGNOSIS — R21 Rash and other nonspecific skin eruption: Secondary | ICD-10-CM | POA: Insufficient documentation

## 2024-09-20 DIAGNOSIS — E785 Hyperlipidemia, unspecified: Secondary | ICD-10-CM | POA: Insufficient documentation

## 2024-09-20 DIAGNOSIS — Z5189 Encounter for other specified aftercare: Secondary | ICD-10-CM | POA: Insufficient documentation

## 2024-09-20 DIAGNOSIS — I10 Essential (primary) hypertension: Secondary | ICD-10-CM | POA: Insufficient documentation

## 2024-09-20 DIAGNOSIS — Z8041 Family history of malignant neoplasm of ovary: Secondary | ICD-10-CM

## 2024-09-20 DIAGNOSIS — T380X5A Adverse effect of glucocorticoids and synthetic analogues, initial encounter: Secondary | ICD-10-CM | POA: Insufficient documentation

## 2024-09-20 DIAGNOSIS — E1165 Type 2 diabetes mellitus with hyperglycemia: Secondary | ICD-10-CM | POA: Insufficient documentation

## 2024-09-20 DIAGNOSIS — Z800 Family history of malignant neoplasm of digestive organs: Secondary | ICD-10-CM

## 2024-09-20 DIAGNOSIS — Z7951 Long term (current) use of inhaled steroids: Secondary | ICD-10-CM | POA: Diagnosis not present

## 2024-09-20 DIAGNOSIS — Z7952 Long term (current) use of systemic steroids: Secondary | ICD-10-CM | POA: Diagnosis not present

## 2024-09-20 DIAGNOSIS — Z17421 Hormone receptor negative with human epidermal growth factor receptor 2 negative status: Secondary | ICD-10-CM | POA: Insufficient documentation

## 2024-09-20 DIAGNOSIS — Z79633 Long term (current) use of mitotic inhibitor: Secondary | ICD-10-CM | POA: Diagnosis not present

## 2024-09-20 DIAGNOSIS — Z79899 Other long term (current) drug therapy: Secondary | ICD-10-CM | POA: Insufficient documentation

## 2024-09-20 DIAGNOSIS — Z8042 Family history of malignant neoplasm of prostate: Secondary | ICD-10-CM

## 2024-09-20 DIAGNOSIS — Z79630 Long term (current) use of alkylating agent: Secondary | ICD-10-CM | POA: Diagnosis not present

## 2024-09-20 DIAGNOSIS — Z806 Family history of leukemia: Secondary | ICD-10-CM

## 2024-09-20 DIAGNOSIS — Z860100 Personal history of colon polyps, unspecified: Secondary | ICD-10-CM

## 2024-09-20 DIAGNOSIS — Z5111 Encounter for antineoplastic chemotherapy: Secondary | ICD-10-CM | POA: Diagnosis not present

## 2024-09-20 LAB — CBC WITH DIFFERENTIAL (CANCER CENTER ONLY)
Abs Immature Granulocytes: 0.02 K/uL (ref 0.00–0.07)
Basophils Absolute: 0 K/uL (ref 0.0–0.1)
Basophils Relative: 1 %
Eosinophils Absolute: 0 K/uL (ref 0.0–0.5)
Eosinophils Relative: 2 %
HCT: 36.3 % (ref 36.0–46.0)
Hemoglobin: 13 g/dL (ref 12.0–15.0)
Immature Granulocytes: 1 %
Lymphocytes Relative: 54 %
Lymphs Abs: 1 K/uL (ref 0.7–4.0)
MCH: 29.9 pg (ref 26.0–34.0)
MCHC: 35.8 g/dL (ref 30.0–36.0)
MCV: 83.4 fL (ref 80.0–100.0)
Monocytes Absolute: 0.3 K/uL (ref 0.1–1.0)
Monocytes Relative: 16 %
Neutro Abs: 0.5 K/uL — ABNORMAL LOW (ref 1.7–7.7)
Neutrophils Relative %: 26 %
Platelet Count: 148 K/uL — ABNORMAL LOW (ref 150–400)
RBC: 4.35 MIL/uL (ref 3.87–5.11)
RDW: 12.9 % (ref 11.5–15.5)
Smear Review: NORMAL
WBC Count: 1.9 K/uL — ABNORMAL LOW (ref 4.0–10.5)
nRBC: 0 % (ref 0.0–0.2)

## 2024-09-20 LAB — CMP (CANCER CENTER ONLY)
ALT: 45 U/L — ABNORMAL HIGH (ref 0–44)
AST: 34 U/L (ref 15–41)
Albumin: 4.1 g/dL (ref 3.5–5.0)
Alkaline Phosphatase: 82 U/L (ref 38–126)
Anion gap: 11 (ref 5–15)
BUN: 10 mg/dL (ref 8–23)
CO2: 25 mmol/L (ref 22–32)
Calcium: 9.2 mg/dL (ref 8.9–10.3)
Chloride: 101 mmol/L (ref 98–111)
Creatinine: 0.82 mg/dL (ref 0.44–1.00)
GFR, Estimated: >60 mL/min
Glucose, Bld: 268 mg/dL — ABNORMAL HIGH (ref 70–99)
Potassium: 4.2 mmol/L (ref 3.5–5.1)
Sodium: 137 mmol/L (ref 135–145)
Total Bilirubin: 0.4 mg/dL (ref 0.0–1.2)
Total Protein: 6.3 g/dL — ABNORMAL LOW (ref 6.5–8.1)

## 2024-09-21 ENCOUNTER — Inpatient Hospital Stay: Admitting: Nurse Practitioner

## 2024-09-21 ENCOUNTER — Inpatient Hospital Stay

## 2024-09-21 ENCOUNTER — Encounter: Payer: Self-pay | Admitting: Hematology

## 2024-09-21 DIAGNOSIS — Z806 Family history of leukemia: Secondary | ICD-10-CM | POA: Insufficient documentation

## 2024-09-21 DIAGNOSIS — Z8041 Family history of malignant neoplasm of ovary: Secondary | ICD-10-CM | POA: Insufficient documentation

## 2024-09-21 DIAGNOSIS — Z803 Family history of malignant neoplasm of breast: Secondary | ICD-10-CM | POA: Insufficient documentation

## 2024-09-21 DIAGNOSIS — Z8042 Family history of malignant neoplasm of prostate: Secondary | ICD-10-CM | POA: Insufficient documentation

## 2024-09-21 DIAGNOSIS — Z800 Family history of malignant neoplasm of digestive organs: Secondary | ICD-10-CM | POA: Insufficient documentation

## 2024-09-22 ENCOUNTER — Ambulatory Visit: Payer: Self-pay | Admitting: Nurse Practitioner

## 2024-09-22 ENCOUNTER — Telehealth: Payer: Self-pay

## 2024-09-22 ENCOUNTER — Other Ambulatory Visit: Payer: Self-pay

## 2024-09-22 ENCOUNTER — Other Ambulatory Visit (HOSPITAL_COMMUNITY): Payer: Self-pay

## 2024-09-22 MED ORDER — NYSTATIN 100000 UNIT/ML MT SUSP
5.0000 mL | Freq: Four times a day (QID) | OROMUCOSAL | 1 refills | Status: AC | PRN
  Filled 2024-09-22: qty 140, 7d supply, fill #0

## 2024-09-22 NOTE — Telephone Encounter (Signed)
 Pt called stating she has mouth soreness and redness.  Pt denied white patches w/in her mouth.  Pt stated the sores started on Monday 09/20/2024 and has gotten worse over the past few days.  Pt denied gargling with warm water  and baking soda but did state she tried gargling with mouthwash which burned like fire.  Instructed pt to continue to gargle with warm water  and baking soda at least 3 times per day.  Spoke with provider regarding pt's call.  Prescription for Magic Mouthwash w/lidocaine  sent to [facility] for the pt to pickup.  Stated that [facility] will not make the Magic Mouthwash until the pt arrives to the pharmacy to pick it up.  Pt verbalized understanding and stated they will pick-up the prescription on tomorrow.

## 2024-09-23 ENCOUNTER — Other Ambulatory Visit (HOSPITAL_COMMUNITY): Payer: Self-pay

## 2024-09-23 ENCOUNTER — Other Ambulatory Visit: Payer: Self-pay

## 2024-10-03 NOTE — Assessment & Plan Note (Signed)
 pT2N0M0, stage IIA, triple negative - Diagnosed in October 2025, discovered on routine screening mammogram.  The tumor was 1.8 cm in the upper inner quadrant of right breast, triple negative with Ki-67 40%. -She underwent lumpectomy and sentinel lymph node biopsy, which showed 2.5 cm invasive ductal carcinoma with negative margins, 1 node was negative. -I recommend adjuvant chemotherapy TC every 3 weeks for 4 cycles. She started on 09/13/2024, had reaction with severe back pain to docetaxel  and was hypertensive, chemo held.  Docetaxel  was rechallenged on the next day and she tolerated well with again hypertension.

## 2024-10-04 ENCOUNTER — Inpatient Hospital Stay

## 2024-10-04 ENCOUNTER — Inpatient Hospital Stay: Admitting: Hematology

## 2024-10-04 VITALS — BP 154/93 | HR 76 | Temp 98.2°F | Resp 16

## 2024-10-04 DIAGNOSIS — Z171 Estrogen receptor negative status [ER-]: Secondary | ICD-10-CM | POA: Diagnosis not present

## 2024-10-04 DIAGNOSIS — C50411 Malignant neoplasm of upper-outer quadrant of right female breast: Secondary | ICD-10-CM

## 2024-10-04 LAB — CBC WITH DIFFERENTIAL (CANCER CENTER ONLY)
Abs Immature Granulocytes: 0.1 K/uL — ABNORMAL HIGH (ref 0.00–0.07)
Basophils Absolute: 0 K/uL (ref 0.0–0.1)
Basophils Relative: 0 %
Eosinophils Absolute: 0 K/uL (ref 0.0–0.5)
Eosinophils Relative: 0 %
HCT: 38.2 % (ref 36.0–46.0)
Hemoglobin: 13.5 g/dL (ref 12.0–15.0)
Immature Granulocytes: 1 %
Lymphocytes Relative: 7 %
Lymphs Abs: 1.1 K/uL (ref 0.7–4.0)
MCH: 29 pg (ref 26.0–34.0)
MCHC: 35.3 g/dL (ref 30.0–36.0)
MCV: 82 fL (ref 80.0–100.0)
Monocytes Absolute: 0.4 K/uL (ref 0.1–1.0)
Monocytes Relative: 3 %
Neutro Abs: 13.9 K/uL — ABNORMAL HIGH (ref 1.7–7.7)
Neutrophils Relative %: 89 %
Platelet Count: 296 K/uL (ref 150–400)
RBC: 4.66 MIL/uL (ref 3.87–5.11)
RDW: 13.4 % (ref 11.5–15.5)
WBC Count: 15.6 K/uL — ABNORMAL HIGH (ref 4.0–10.5)
nRBC: 0 % (ref 0.0–0.2)

## 2024-10-04 LAB — CMP (CANCER CENTER ONLY)
ALT: 33 U/L (ref 0–44)
AST: 23 U/L (ref 15–41)
Albumin: 4.7 g/dL (ref 3.5–5.0)
Alkaline Phosphatase: 81 U/L (ref 38–126)
Anion gap: 15 (ref 5–15)
BUN: 17 mg/dL (ref 8–23)
CO2: 22 mmol/L (ref 22–32)
Calcium: 9.9 mg/dL (ref 8.9–10.3)
Chloride: 100 mmol/L (ref 98–111)
Creatinine: 1.05 mg/dL — ABNORMAL HIGH (ref 0.44–1.00)
GFR, Estimated: 56 mL/min — ABNORMAL LOW
Glucose, Bld: 304 mg/dL — ABNORMAL HIGH (ref 70–99)
Potassium: 4 mmol/L (ref 3.5–5.1)
Sodium: 137 mmol/L (ref 135–145)
Total Bilirubin: 0.5 mg/dL (ref 0.0–1.2)
Total Protein: 7 g/dL (ref 6.5–8.1)

## 2024-10-04 MED ORDER — DEXAMETHASONE SOD PHOSPHATE PF 10 MG/ML IJ SOLN
10.0000 mg | Freq: Once | INTRAMUSCULAR | Status: AC
  Administered 2024-10-04: 10 mg via INTRAVENOUS
  Filled 2024-10-04: qty 1

## 2024-10-04 MED ORDER — SODIUM CHLORIDE 0.9 % IV SOLN
75.0000 mg/m2 | Freq: Once | INTRAVENOUS | Status: AC
  Administered 2024-10-04: 160 mg via INTRAVENOUS
  Filled 2024-10-04: qty 16

## 2024-10-04 MED ORDER — PALONOSETRON HCL INJECTION 0.25 MG/5ML
0.2500 mg | Freq: Once | INTRAVENOUS | Status: AC
  Administered 2024-10-04: 0.25 mg via INTRAVENOUS
  Filled 2024-10-04: qty 5

## 2024-10-04 MED ORDER — FAMOTIDINE IN NACL 20-0.9 MG/50ML-% IV SOLN
20.0000 mg | Freq: Once | INTRAVENOUS | Status: AC
  Administered 2024-10-04: 20 mg via INTRAVENOUS
  Filled 2024-10-04: qty 50

## 2024-10-04 MED ORDER — SODIUM CHLORIDE 0.9 % IV SOLN: INTRAVENOUS | Status: DC

## 2024-10-04 MED ORDER — SODIUM CHLORIDE 0.9 % IV SOLN
600.0000 mg/m2 | Freq: Once | INTRAVENOUS | Status: AC
  Administered 2024-10-04: 1180 mg via INTRAVENOUS
  Filled 2024-10-04: qty 59

## 2024-10-04 MED ORDER — DIPHENHYDRAMINE HCL 50 MG/ML IJ SOLN
50.0000 mg | Freq: Once | INTRAMUSCULAR | Status: AC
  Administered 2024-10-04: 50 mg via INTRAVENOUS
  Filled 2024-10-04: qty 1

## 2024-10-04 NOTE — Patient Instructions (Addendum)
 [facility] - A DEPT OF [facility]  Discharge Instructions: Thank you for choosing [facility] to provide your oncology and hematology care.   If you have a lab appointment with the Cancer Center, please go directly to the Cancer Center and check in at the registration area.   Wear comfortable clothing and clothing appropriate for easy access to any Portacath or PICC line.   We strive to give you quality time with your provider. You may need to reschedule your appointment if you arrive late (15 or more minutes).  Arriving late affects you and other patients whose appointments are after yours.  Also, if you miss three or more appointments without notifying the office, you may be dismissed from the clinic at the providers discretion.      For prescription refill requests, have your pharmacy contact our office and allow 72 hours for refills to be completed.    Today you received the following chemotherapy and/or immunotherapy agents Cyclophosphamide , Docetaxel       To help prevent nausea and vomiting after your treatment, we encourage you to take your nausea medication as directed.  BELOW ARE SYMPTOMS THAT SHOULD BE REPORTED IMMEDIATELY: *FEVER GREATER THAN 100.4 F (38 C) OR HIGHER *CHILLS OR SWEATING *NAUSEA AND VOMITING THAT IS NOT CONTROLLED WITH YOUR NAUSEA MEDICATION *UNUSUAL SHORTNESS OF BREATH *UNUSUAL BRUISING OR BLEEDING *URINARY PROBLEMS (pain or burning when urinating, or frequent urination) *BOWEL PROBLEMS (unusual diarrhea, constipation, pain near the anus) TENDERNESS IN MOUTH AND THROAT WITH OR WITHOUT PRESENCE OF ULCERS (sore throat, sores in mouth, or a toothache) UNUSUAL RASH, SWELLING OR PAIN  UNUSUAL VAGINAL DISCHARGE OR ITCHING   Items with * indicate a potential emergency and should be followed up as soon as possible or go to the Emergency Department if any problems should occur.  Please show the CHEMOTHERAPY ALERT CARD  or IMMUNOTHERAPY ALERT CARD at check-in to the Emergency Department and triage nurse.  Should you have questions after your visit or need to cancel or reschedule your appointment, please contact [facility] - A DEPT OF [facility]  Dept: [phone]  and follow the prompts.  Office hours are 8:00 a.m. to 4:30 p.m. Monday - Friday. Please note that voicemails left after 4:00 p.m. may not be returned until the following business day.  We are closed weekends and major holidays. You have access to a nurse at all times for urgent questions. Please call the main number to the clinic Dept: [phone] and follow the prompts.   For any non-urgent questions, you may also contact your provider using MyChart. We now offer e-Visits for anyone 60 and older to request care online for non-urgent symptoms. For details visit [email or web address].   Also download the MyChart app! Go to the app store, search MyChart, open the app, select [place], and log in with your MyChart username and password.

## 2024-10-04 NOTE — Progress Notes (Signed)
 Given hypertension with docetaxel  rechallenge on 09/14/24, give cyclophosphamide  prior to docetaxel  today per Dr. Lanny.  Harlene Nasuti, PharmD Oncology Infusion Pharmacist 10/04/2024 1:02 PM

## 2024-10-04 NOTE — Progress Notes (Signed)
 " [facility]   [phone] [phone]   Clinic Follow up Note   Patient Care Team: Jolinda Norene [name], DO as PCP - General (Family Medicine) Mona Vinie BROCKS, MD as PCP - Cardiology (Cardiology) Leva Rush, MD as Consulting Physician (Obstetrics and Gynecology) [facility] 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  The tumor was 1.8 cm in the upper inner quadrant of right breast, triple negative with Ki-67 40%. -She underwent lumpectomy and sentinel lymph node biopsy, which showed 2.5 cm invasive ductal carcinoma with negative margins, 1 node was negative. -I recommend adjuvant chemotherapy TC every 3 weeks for 4 cycles. She started on 09/13/2024, had reaction with severe back pain to docetaxel  and was hypertensive, chemo held.  Docetaxel  was rechallenged on the next day and she tolerated well with again hypertension.  Assessment & Plan Malignant neoplasm of upper-outer quadrant of right breast, estrogen receptor negative Undergoing adjuvant chemotherapy for triple-negative right breast cancer.  -She had reaction to the first cycle docetaxel , was able to completed the next day, with persistent hypertension. - Lab reviewed, adequate for treatment.  Continue adjuvant chemotherapy as scheduled with close monitoring. - Next chemotherapy round scheduled for February 9th.  Adjuvant chemotherapy management and related toxicities Receiving docetaxel  and cyclophosphamide . Experienced infusion-related back pain, hypertension, pruritic rash on arms and legs, and bone pain  post-supportive injection. Rash managed with antihistamines and steroids; bone pain controlled with analgesics. No significant nausea or diarrhea. Risk of rash and infusion reactions persists; steroid premedication continued as benefit outweighs risk. - Administer cyclophosphamide  first, as no prior reaction occurred. - Administer docetaxel  with additional premedication and slow infusion to minimize reaction risk. - Monitor blood pressure every 15 minutes during docetaxel  infusion. - Continue steroid premedication to prevent rash, despite risk of hyperglycemia and hypertension. - Recommend acetaminophen  or ibuprofen as needed for bone pain. - Refill loratadine as needed for rash or pruritus. - Return in three weeks for next cycle; no interim visit unless new symptoms develop. - Return for scheduled supportive injection.  Hypertension Labile hypertension with marked elevation during chemotherapy infusions, likely exacerbated by pain, stress, and steroid use. Blood pressure well-controlled at home with atenolol . Clonidine  used for acute management during infusions. Ongoing risk of acute hypertension during chemotherapy; will manage with close monitoring and medication adjustment as needed. - Monitor blood pressure closely during chemotherapy infusions. - Administer clonidine  as needed for acute hypertension during infusion; consider increasing dose to 0.2 mg if required. - Continue atenolol  as home antihypertensive regimen. - Assess need for additional antihypertensive agents if blood pressure remains uncontrolled during future infusions.  Steroid-induced hyperglycemia Significant hyperglycemia following steroid premedication for chemotherapy. Not diabetic; hyperglycemia attributed to steroid use. Dietary modifications discussed to minimize risk during steroid administration. Steroid premedication continued as benefit in preventing rash outweighs risk of transient hyperglycemia. - Advise low  carbohydrate diet and avoidance of sweets and soft drinks during and for several days after steroid use. - Encourage adequate protein and vegetable intake to maintain nutrition while minimizing hyperglycemia risk. - Continue steroid premedication as benefit outweighs risk in preventing chemotherapy-related rash.  Plan - Chemo side  effect and management reviewed with patient and her family - Lab reviewed, adequate for treatment, will proceed cycle 2 TC today with GCSF on day 3 - She will return in 3 weeks for cycle 3   SUMMARY OF ONCOLOGIC HISTORY: Oncology History  Malignant neoplasm of upper-outer quadrant of right breast in female, estrogen receptor negative (HCC)  07/08/2024 Cancer Staging   Staging form: Breast, AJCC 8th Edition - Clinical stage from 07/08/2024: Stage IB (cT1c, cN0, cM0, G3, ER-, PR-, HER2-) - Signed by Lanny Callander, MD on 07/20/2024 Stage prefix: Initial diagnosis Histologic grading system: 3 grade system   07/20/2024 Initial Diagnosis   Malignant neoplasm of upper-outer quadrant of right breast in female, estrogen receptor negative (HCC)   08/10/2024 Cancer Staging   Staging form: Breast, AJCC 8th Edition - Pathologic stage from 08/10/2024: Stage IIA (pT2, pN0, cM0, G3, ER-, PR-, HER2-) - Signed by Lanny Callander, MD on 08/22/2024 Stage prefix: Initial diagnosis Histologic grading system: 3 grade system Residual tumor (R): R0   09/13/2024 -  Chemotherapy   Patient is on Treatment Plan : BREAST TC q21d        Discussed the use of AI scribe software for clinical note transcription with the patient, who gave verbal consent to proceed.  History of Present Illness [name] is a 74 year old female with ER-negative right breast cancer receiving adjuvant chemotherapy who presents for follow-up to assess chemotherapy tolerance and management of treatment-related toxicities.  She recently completed her last cycle of docetaxel  and cyclophosphamide , with the next  infusion planned for February 9. During the prior chemotherapy infusion she developed acute severe back pain and marked hypertension immediately post-infusion, requiring temporary interruption and then completion of treatment. She has labile hypertension, with home systolic readings 120-130 mmHg but acute spikes during chemotherapy, especially with pain or stress. She takes atenolol  at home and has required clonidine  and Hyzaar during infusions for acute blood pressure control. She did not take atenolol  this morning. She denies persistent anxiety, though she notes mild intermittent anxiety.  She developed a pruritic rash mainly on her arms and mildly on her legs, which improved with diphenhydramine  and steroid premedication. She continues loratadine and topical cream with adequate supply. She had diffuse bone pain during the first week after her last chemotherapy, which she associates with an injection from her regimen. The pain is tolerable and managed with acetaminophen  or ibuprofen as needed. She denies nausea or diarrhea.  She has significant hyperglycemia temporally related to steroid premedication, with a recent glucose of 304 mg/dL. She otherwise feels well, with no new symptoms since her last treatment.     All other systems were reviewed with the patient and are negative.  MEDICAL HISTORY:  Past Medical History:  Diagnosis Date   Allergy    Anxiety    Asthma    under control   Back pain    Breast cancer (HCC) 07/2024   right breast ductal adenocarcinoma   GERD (gastroesophageal reflux disease)    Hyperlipidemia    Hypertension    Tendinitis of left rotator cuff 07/20/2020   Transient memory loss 12/09/2014   Type 2 diabetes mellitus (HCC)     SURGICAL HISTORY: Past Surgical History:  Procedure Laterality Date   bladder mesh     BREAST BIOPSY Right 07/08/2024   US  RT BREAST BX W LOC DEV 1ST LESION IMG BX SPEC US  GUIDE 07/08/2024 [facility]   BREAST BIOPSY  08/10/2024    US  RT RADIOACTIVE SEED LOC  08/10/2024 [facility]   BREAST LUMPECTOMY WITH RADIOACTIVE SEED AND SENTINEL LYMPH NODE BIOPSY Right 08/10/2024   Procedure: BREAST LUMPECTOMY WITH RADIOACTIVE SEED AND SENTINEL LYMPH NODE BIOPSY;  Surgeon: Vanderbilt Ned, MD;  Location: [facility];  Service: General;  Laterality: Right;   COLONOSCOPY N/A 07/12/2015   Procedure: COLONOSCOPY;  Surgeon: Claudis RAYMOND Rivet, MD;  Location: [facility];  Service: Endoscopy;  Laterality: N/A;  1030   COLONOSCOPY WITH PROPOFOL  N/A 11/18/2023   Procedure: COLONOSCOPY WITH PROPOFOL ;  Surgeon: Eartha Angelia Sieving, MD;  Location: [facility];  Service: Gastroenterology;  Laterality: N/A;  8:15AM;ASA 1   NM MYOCAR PERF WALL MOTION  02/15/2007   dipyridamole myoview; EF 70%, no wall motion abnormalities, no inducible ischemia, low risk    OVARIAN CYST REMOVAL Left 09/16/1985   POLYPECTOMY  11/18/2023   Procedure: POLYPECTOMY, INTESTINE;  Surgeon: Eartha Angelia Sieving, MD;  Location: [facility];  Service: Gastroenterology;;   PORTACATH PLACEMENT N/A 08/10/2024   Procedure: INSERTION, TUNNELED CENTRAL VENOUS DEVICE, WITH PORT;  Surgeon: Vanderbilt Ned, MD;  Location: [facility];  Service: General;  Laterality: N/A;  PORT PLACEMENT WITH ULTRASOUND GUIDANCE   TONSILECTOMY/ADENOIDECTOMY WITH MYRINGOTOMY  09/16/1956   TONSILLECTOMY     TRANSTHORACIC ECHOCARDIOGRAM  02/15/2007   RV mildly dlated; LV normal in size; mild MR with thickened MV leaflets; mild TR; mild pulm valve regurg   VAGINAL DELIVERY     x3    I have reviewed the social history and family history with the patient and they are unchanged from previous note.  ALLERGIES:  is allergic to docetaxel , bee venom, crestor [rosuvastatin], erythromycin, rosuvastatin calcium, pravastatin , zetia  [ezetimibe ], aleve [naproxen sodium], tetracyclines & related, vytorin [ezetimibe -simvastatin], and wound dressing  adhesive.  MEDICATIONS:  Current Outpatient Medications  Medication Sig Dispense Refill   albuterol  (VENTOLIN  HFA) 108 (90 Base) MCG/ACT inhaler Inhale 2 puffs into the lungs every 6 (six) hours as needed for wheezing or shortness of breath. 1 each 0   allopurinol  (ZYLOPRIM ) 300 MG tablet TAKE 1 TABLET BY MOUTH EVERY  EVENING 100 tablet 0   Ascorbic Acid (VITAMIN C PO) Take by mouth.     atenolol  (TENORMIN ) 50 MG tablet TAKE 1 TABLET BY MOUTH DAILY 90 tablet 3   azelastine  (ASTELIN ) 0.1 % nasal spray Place 1 spray into both nostrils 2 (two) times daily. 30 mL 12   Bempedoic Acid  (NEXLETOL ) 180 MG TABS Take 1 tablet (180 mg total) by mouth daily. (Patient not taking: Reported on 08/23/2024) 30 tablet 11   BIOTIN PO Take by mouth.     Blood Glucose Monitoring Suppl DEVI Check BGs daily. E11.65. May substitute to any manufacturer covered by patient's insurance. 1 each 0   budesonide (PULMICORT) 180 MCG/ACT inhaler Inhale 2 puffs into the lungs 2 (two) times daily.     cetirizine (ZYRTEC) 10 MG tablet Take 1 tablet by mouth daily.     cholecalciferol (VITAMIN D3) 25 MCG (1000 UT) tablet Take 1,000 Units by mouth daily.     Coenzyme Q10 (COQ-10) 200 MG CAPS Take by mouth daily.     dexamethasone  (DECADRON ) 4 MG tablet Take 2 tabs by mouth 2 times daily starting day before chemo. Then take 2 tabs daily for 2 days starting day after chemo. Take with food. 30 tablet 1   EPINEPHrine  (EPIPEN  2-PAK) 0.3 mg/0.3 mL IJ SOAJ injection Inject 0.3 mg into the muscle as needed for anaphylaxis (then go to ER). 1 each  0   esomeprazole  (NEXIUM ) 20 MG capsule Take 1 capsule (20 mg total) by mouth daily at 12 noon. For GERD 100 capsule 3   Evolocumab  (REPATHA  SURECLICK) 140 MG/ML SOAJ Inject 140 mg into the skin every 14 (fourteen) days. (Patient not taking: Reported on 08/23/2024) 2 mL 11   Glucose Blood (BLOOD GLUCOSE TEST STRIPS) STRP Check BGs daily. E11.65. May substitute to any manufacturer covered by patient's  insurance. 100 strip 3   ketoconazole  (NIZORAL ) 2 % cream Apply 1 Application topically daily. X7-14 days to rash 15 g 0   Lancet Device MISC Check BGs daily. E11.65. May substitute to any manufacturer covered by patient's insurance. 1 each 0   Lancets Misc. MISC Check BGs daily. E11.65. May substitute to any manufacturer covered by patient's insurance. (Patient not taking: Reported on 08/23/2024) 100 each 3   lidocaine -prilocaine  (EMLA ) cream Apply to affected area once 30 g 3   magic mouthwash (nystatin , lidocaine , diphenhydrAMINE , alum & mag hydroxide) suspension Swish around mouth and swallow 5 mLs by mouth 4 (four) times daily as needed for mouth pain. 140 mL 1   montelukast  (SINGULAIR ) 10 MG tablet TAKE 1 TABLET BY MOUTH IN THE  MORNING 100 tablet 2   Multiple Vitamin (MULTIVITAMIN) capsule Take 1 capsule by mouth daily.      ondansetron  (ZOFRAN ) 8 MG tablet Take 1 tablet (8 mg total) by mouth every 8 (eight) hours as needed for nausea or vomiting. Start on the third day after chemotherapy. 30 tablet 1   oxyCODONE  (OXY IR/ROXICODONE ) 5 MG immediate release tablet Take 1 tablet (5 mg total) by mouth every 6 (six) hours as needed for severe pain (pain score 7-10). 15 tablet 0   PARoxetine  (PAXIL ) 10 MG tablet Take 1 tablet (10 mg total) by mouth daily. 30 tablet 1   prochlorperazine  (COMPAZINE ) 10 MG tablet Take 1 tablet (10 mg total) by mouth every 6 (six) hours as needed for nausea or vomiting. 30 tablet 1   rOPINIRole (REQUIP) 0.25 MG tablet Take 0.25 mg by mouth as needed.     triamcinolone  ointment (KENALOG ) 0.1 % Apply 1 Application topically 2 (two) times daily. 30 g 0   No current facility-administered medications for this visit.    PHYSICAL EXAMINATION: ECOG PERFORMANCE STATUS: 1 - Symptomatic but completely ambulatory  There were no vitals filed for this visit. Wt Readings from Last 3 Encounters:  09/13/24 184 lb (83.5 kg)  09/13/24 184 lb 8 oz (83.7 kg)  08/23/24 181 lb 1.6 oz  (82.1 kg)     GENERAL:alert, no distress and comfortable SKIN: skin color, texture, turgor are normal, no rashes or significant lesions EYES: normal, Conjunctiva are pink and non-injected, sclera clear NECK: supple, thyroid  normal size, non-tender, without nodularity LYMPH:  no palpable lymphadenopathy in the cervical, axillary  LUNGS: clear to auscultation and percussion with normal breathing effort HEART: regular rate & rhythm and no murmurs and no lower extremity edema ABDOMEN:abdomen soft, non-tender and normal bowel sounds Musculoskeletal:no cyanosis of digits and no clubbing  NEURO: alert & oriented x 3 with fluent speech, no focal motor/sensory deficits  Physical Exam VITALS: BP- 161/96  LABORATORY DATA:  I have reviewed the data as listed    Latest Ref Rng & Units 10/04/2024   12:06 PM 09/20/2024    4:21 PM 09/13/2024   12:07 PM  CBC  WBC 4.0 - 10.5 K/uL 15.6  1.9  14.5   Hemoglobin 12.0 - 15.0 g/dL 86.4  86.9  13.5  Hematocrit 36.0 - 46.0 % 38.2  36.3  38.5   Platelets 150 - 400 K/uL 296  148  196         Latest Ref Rng & Units 10/04/2024   12:06 PM 09/20/2024    4:21 PM 09/13/2024   12:07 PM  CMP  Glucose 70 - 99 mg/dL 695  731  583   BUN 8 - 23 mg/dL 17  10  21    Creatinine 0.44 - 1.00 mg/dL 8.94  9.17  9.01   Sodium 135 - 145 mmol/L 137  137  133   Potassium 3.5 - 5.1 mmol/L 4.0  4.2  4.1   Chloride 98 - 111 mmol/L 100  101  97   CO2 22 - 32 mmol/L 22  25  20    Calcium 8.9 - 10.3 mg/dL 9.9  9.2  9.9   Total Protein 6.5 - 8.1 g/dL 7.0  6.3  7.2   Total Bilirubin 0.0 - 1.2 mg/dL 0.5  0.4  0.5   Alkaline Phos 38 - 126 U/L 81  82  86   AST 15 - 41 U/L 23  34  29   ALT 0 - 44 U/L 33  45  43       RADIOGRAPHIC STUDIES: I have personally reviewed the radiological images as listed and agreed with the findings in the report. No results found.    No orders of the defined types were placed in this encounter.  All questions were answered. The patient knows to  call the clinic with any problems, questions or concerns. No barriers to learning was detected. The total time spent in the appointment was 30 minutes, including review of chart and various tests results, discussions about plan of care and coordination of care plan     Onita Mattock, MD 10/04/2024     "

## 2024-10-06 ENCOUNTER — Inpatient Hospital Stay

## 2024-10-06 VITALS — BP 123/78 | HR 67 | Resp 16

## 2024-10-06 DIAGNOSIS — C50411 Malignant neoplasm of upper-outer quadrant of right female breast: Secondary | ICD-10-CM

## 2024-10-06 MED ORDER — PEGFILGRASTIM-CBQV 6 MG/0.6ML ~~LOC~~ SOSY
6.0000 mg | PREFILLED_SYRINGE | Freq: Once | SUBCUTANEOUS | Status: AC
  Administered 2024-10-06: 6 mg via SUBCUTANEOUS
  Filled 2024-10-06: qty 0.6

## 2024-10-09 ENCOUNTER — Encounter: Payer: Self-pay | Admitting: Hematology

## 2024-10-15 ENCOUNTER — Telehealth (INDEPENDENT_AMBULATORY_CARE_PROVIDER_SITE_OTHER): Admitting: Family Medicine

## 2024-10-15 ENCOUNTER — Ambulatory Visit: Payer: Self-pay

## 2024-10-15 ENCOUNTER — Encounter: Payer: Self-pay | Admitting: Family Medicine

## 2024-10-15 DIAGNOSIS — R739 Hyperglycemia, unspecified: Secondary | ICD-10-CM

## 2024-10-15 DIAGNOSIS — T380X5A Adverse effect of glucocorticoids and synthetic analogues, initial encounter: Secondary | ICD-10-CM

## 2024-10-15 NOTE — Telephone Encounter (Signed)
 See if patient can do a virtual visit with me right now and I will contact her immediately

## 2024-10-15 NOTE — Telephone Encounter (Addendum)
 FYI Only or Action Required?: Action required by provider: clinical question for provider.  Patient was last seen in primary care on 05/03/2024 by Jolinda Norene [name], DO.  Called Nurse Triage reporting PCP No Triage.   Triage Disposition: Call PCP Within 24 Hours  Patient/caregiver understands and will follow disposition?: Yes   Message from Harlene ORN sent at 10/15/2024  1:41 PM EST  Reason for Triage: blood sugar is high; lightheaded   Pt denies blood sure being elevated or lightheadedness.   Reason for Disposition  [1] Caller requests to speak ONLY to PCP AND [2] NON-URGENT question  Answer Assessment - Initial Assessment Questions 1. REASON FOR CALL or QUESTION: What is your reason for calling today? or How can I best     Pt states she has been receiving chemo that includes the admin of steroids. Pt states her glucose is elevated after treatment due to regimen and is scheduled to come into clinic on 11/12/24 for an A1C check.  Pt is concerned stating her A1C is normally within range and does not want to have it checked at that appt due to treatments causing higher levels over the span of approx 3 months by the time of appt.  Pt wants to know if PCP would like to keep visit for 02.27.26, or reschedule a different time to check A1C.  Protocols used: PCP Call - No Triage-A-AH

## 2024-10-15 NOTE — Progress Notes (Signed)
 Didn't really have dizziness.  Was just worried her A1c would be up do to steroids from chemo treatments.  She wanted to make sure that I knew that her sugar was not going to be elevated because of something she was doing.  She still has about 2 more infusions to go and then she will go over to radiation.  Advised her to keep her appointment at the end of February and we will check and treat sugars accordingly but based on the blood sugar she reviewed with me over the phone which was an isolated max of 200s and typically in the 120s I do not anticipate A1c over 7.0 that would require any type of medical intervention at this time

## 2024-10-15 NOTE — Telephone Encounter (Signed)
 Appointment scheduled.

## 2024-10-25 ENCOUNTER — Inpatient Hospital Stay: Admitting: Hematology

## 2024-10-25 ENCOUNTER — Inpatient Hospital Stay

## 2024-10-27 ENCOUNTER — Inpatient Hospital Stay

## 2024-11-09 ENCOUNTER — Ambulatory Visit: Admitting: Rehabilitation

## 2024-11-12 ENCOUNTER — Ambulatory Visit: Payer: Self-pay | Admitting: Family Medicine

## 2024-11-15 ENCOUNTER — Inpatient Hospital Stay: Admitting: Hematology

## 2024-11-15 ENCOUNTER — Inpatient Hospital Stay

## 2024-11-17 ENCOUNTER — Inpatient Hospital Stay

## 2025-01-18 ENCOUNTER — Inpatient Hospital Stay

## 2025-01-18 ENCOUNTER — Inpatient Hospital Stay: Admitting: Hematology
